# Patient Record
Sex: Male | Born: 1955 | Race: White | Hispanic: No | Marital: Married | Smoking: Former smoker
Health system: Southern US, Community
[De-identification: ages and names within clinical notes are randomized; demographics above are authoritative.]

## PROBLEM LIST (undated history)

## (undated) DIAGNOSIS — N529 Male erectile dysfunction, unspecified: Secondary | ICD-10-CM

## (undated) DIAGNOSIS — Z87442 Personal history of urinary calculi: Secondary | ICD-10-CM

## (undated) DIAGNOSIS — N400 Enlarged prostate without lower urinary tract symptoms: Secondary | ICD-10-CM

## (undated) DIAGNOSIS — M1990 Unspecified osteoarthritis, unspecified site: Secondary | ICD-10-CM

## (undated) DIAGNOSIS — E119 Type 2 diabetes mellitus without complications: Secondary | ICD-10-CM

## (undated) DIAGNOSIS — I639 Cerebral infarction, unspecified: Secondary | ICD-10-CM

## (undated) DIAGNOSIS — F32A Depression, unspecified: Secondary | ICD-10-CM

## (undated) DIAGNOSIS — J45909 Unspecified asthma, uncomplicated: Secondary | ICD-10-CM

## (undated) DIAGNOSIS — K219 Gastro-esophageal reflux disease without esophagitis: Secondary | ICD-10-CM

## (undated) DIAGNOSIS — E669 Obesity, unspecified: Secondary | ICD-10-CM

## (undated) DIAGNOSIS — I4891 Unspecified atrial fibrillation: Secondary | ICD-10-CM

## (undated) DIAGNOSIS — J449 Chronic obstructive pulmonary disease, unspecified: Secondary | ICD-10-CM

## (undated) DIAGNOSIS — I509 Heart failure, unspecified: Secondary | ICD-10-CM

## (undated) DIAGNOSIS — F329 Major depressive disorder, single episode, unspecified: Secondary | ICD-10-CM

## (undated) DIAGNOSIS — E785 Hyperlipidemia, unspecified: Secondary | ICD-10-CM

## (undated) DIAGNOSIS — I69319 Unspecified symptoms and signs involving cognitive functions following cerebral infarction: Secondary | ICD-10-CM

## (undated) DIAGNOSIS — G4730 Sleep apnea, unspecified: Secondary | ICD-10-CM

## (undated) DIAGNOSIS — I10 Essential (primary) hypertension: Secondary | ICD-10-CM

## (undated) HISTORY — PX: OTHER SURGICAL HISTORY: SHX169

## (undated) HISTORY — PX: COLONOSCOPY WITH PROPOFOL: SHX5780

---
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
## 2005-03-25 ENCOUNTER — Ambulatory Visit: Payer: Self-pay | Admitting: Internal Medicine

## 2005-07-29 ENCOUNTER — Ambulatory Visit: Payer: Self-pay | Admitting: Family Medicine

## 2007-12-21 ENCOUNTER — Ambulatory Visit: Payer: Self-pay | Admitting: Gastroenterology

## 2010-05-26 ENCOUNTER — Ambulatory Visit: Payer: Self-pay | Admitting: Ophthalmology

## 2012-11-07 ENCOUNTER — Ambulatory Visit: Payer: Self-pay | Admitting: Unknown Physician Specialty

## 2012-11-27 ENCOUNTER — Ambulatory Visit: Payer: Self-pay | Admitting: Urology

## 2013-05-30 DIAGNOSIS — I639 Cerebral infarction, unspecified: Secondary | ICD-10-CM

## 2013-05-30 HISTORY — DX: Cerebral infarction, unspecified: I63.9

## 2013-06-11 ENCOUNTER — Ambulatory Visit: Payer: Self-pay | Admitting: Urology

## 2013-07-12 ENCOUNTER — Ambulatory Visit: Payer: Self-pay | Admitting: Urology

## 2015-01-31 IMAGING — CT CT ABDOMEN AND PELVIS WITHOUT AND WITH CONTRAST
2 of 10 series · 11 of 46 positions shown, 18 images · IV contrast (isovue)
Comparison: Renal ultrasound 06/11/2013 and prior abdominal CT
11/27/2012 and 07/29/2005

CLINICAL DATA: Followup renal ultrasound to evaluate possible right
renal mass.

EXAM:
CT ABDOMEN AND PELVIS WITHOUT AND WITH CONTRAST
TECHNIQUE: Multidetector CT imaging of the abdomen and pelvis was performed
without contrast material in one or both body regions, followed by
contrast material(s) and further sections in one or both body
regions.
CONTRAST:  100 mL Isovue 370 IV

[Series 2: renal wo · axial · 0.88mm/px · z∈[-899,-485]mm · 9 of 253 slices shown, 15 images]
[im 23/253  soft-tissue]
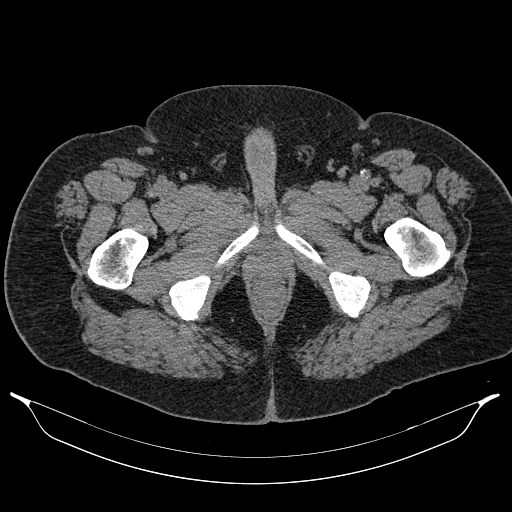
[im 23/253  bone]
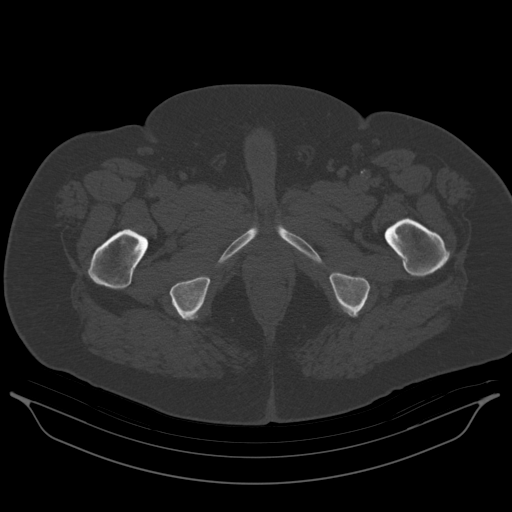
[im 46/253  soft-tissue]
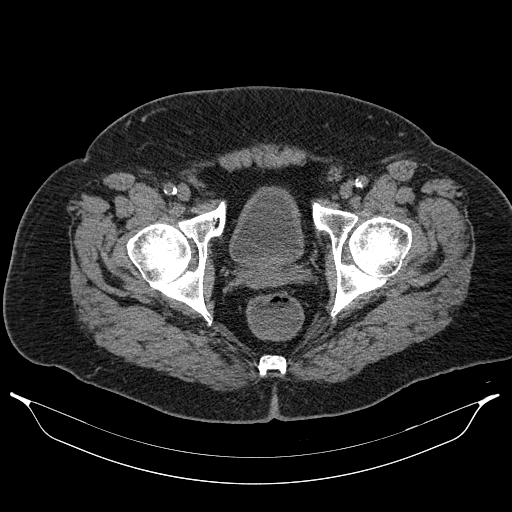
[im 69/253  soft-tissue]
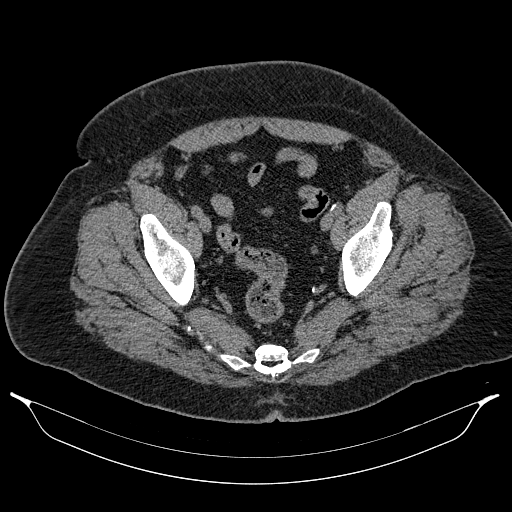
[im 92/253  soft-tissue]
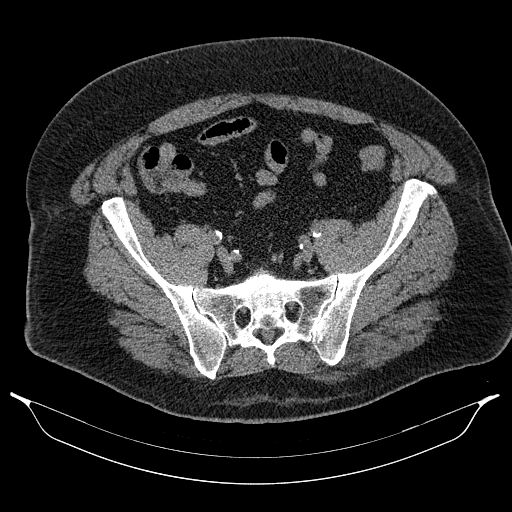
[im 138/253  soft-tissue]
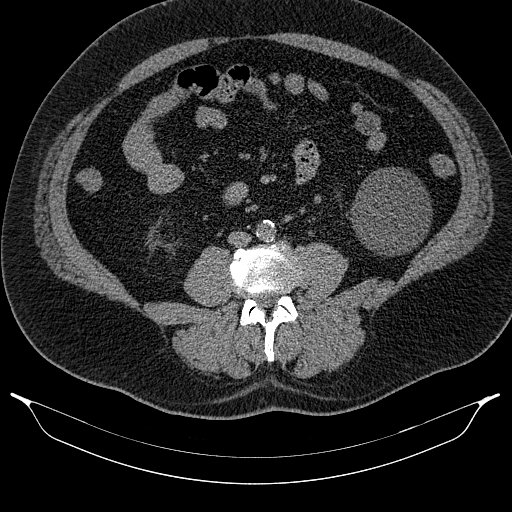
[im 161/253  soft-tissue]
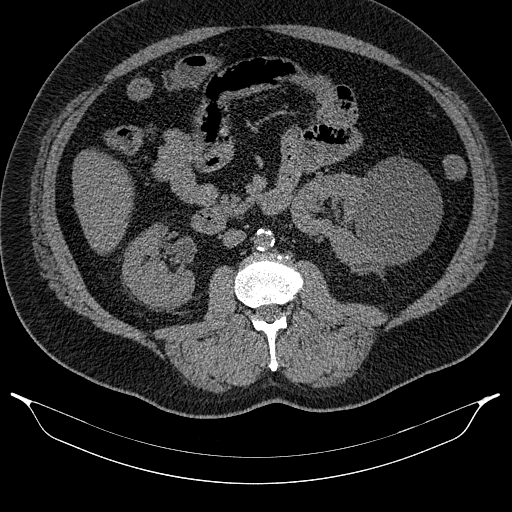
[im 161/253  lung]
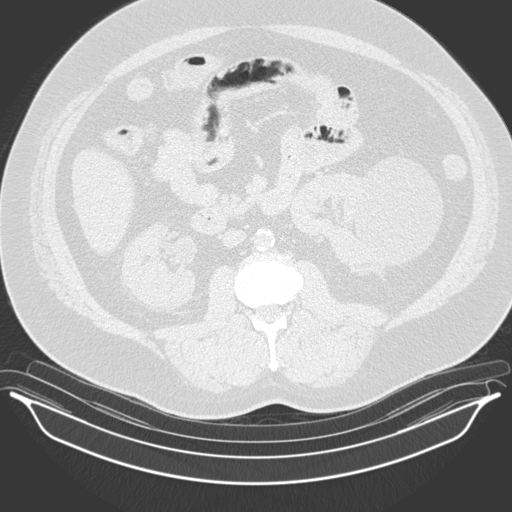
[im 184/253  soft-tissue]
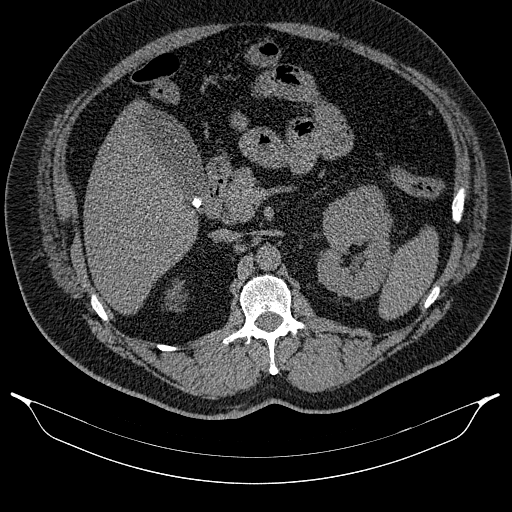
[im 184/253  lung]
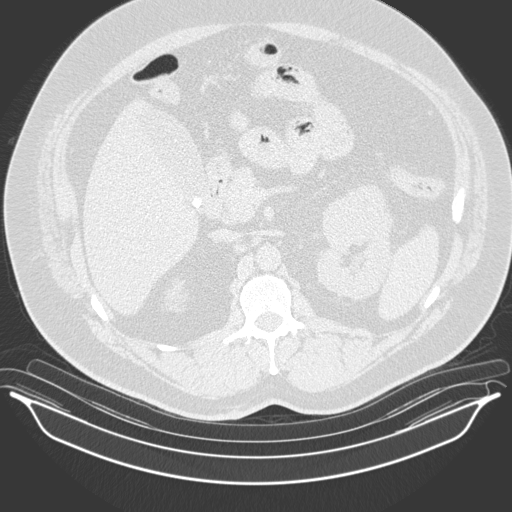
[im 207/253  soft-tissue]
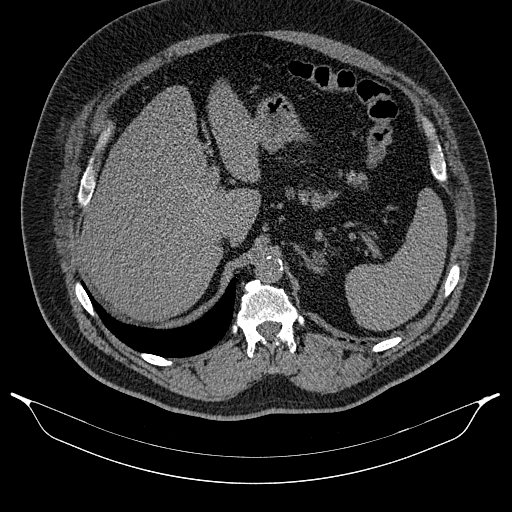
[im 207/253  lung]
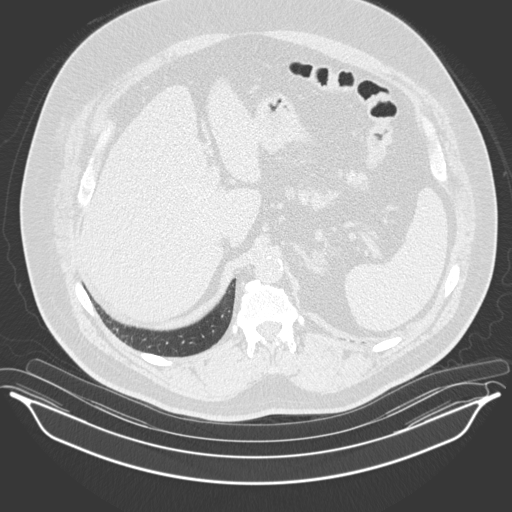
[im 230/253  soft-tissue]
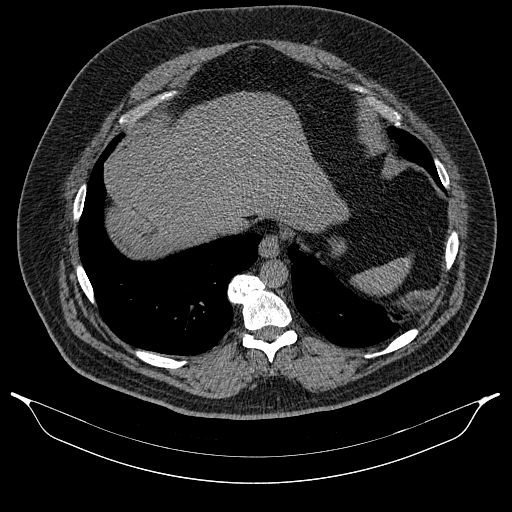
[im 230/253  lung]
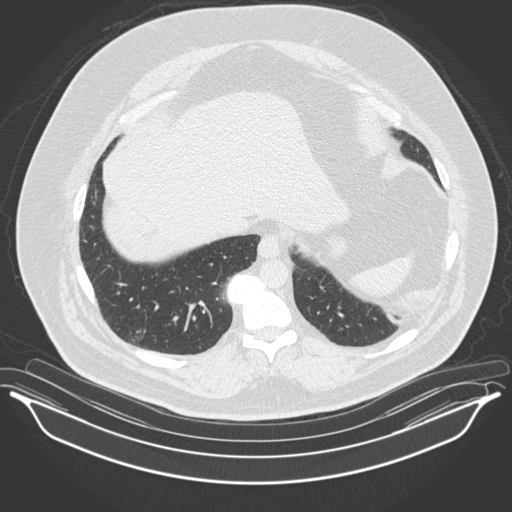
[im 230/253  bone]
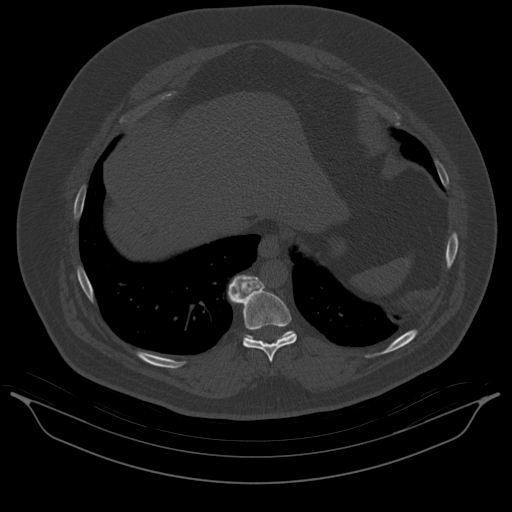

[Series 602: coronal pre · coronal · non-contrast · 0.99mm/px · 2 of 193 slices shown, 3 images]
[im 65/193  soft-tissue]
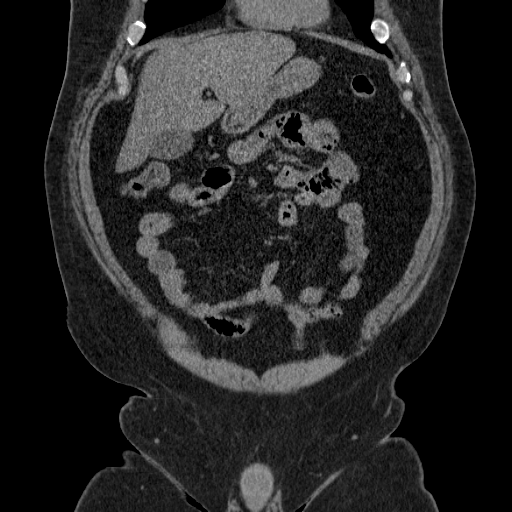
[im 65/193  bone]
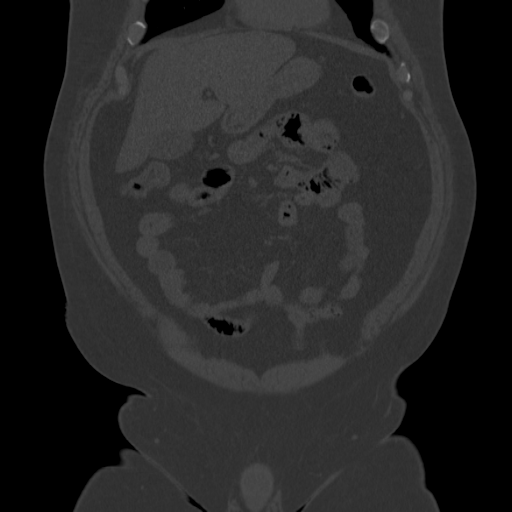
[im 129/193  soft-tissue]
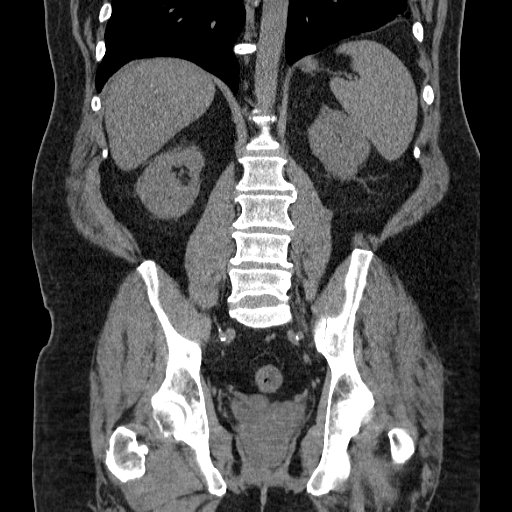

[11 of 46 positions shown; findings below may reference images not displayed]

FINDINGS: Lung bases demonstrate mild focal areas of peripheral increased
interstitial markings unchanged.

Abdominal images a sub cm hypodensity over the dome of the left lobe
of the liver there is mild cholelithiasis. There is mild nodularity
of the left adrenal gland unchanged from 2228. The spleen, right
adrenal gland and pancreas are normal. Appendix is normal. There is
calcified atherosclerotic plaque involving the abdominal aorta and
iliac arteries.

Kidneys are normal in size without evidence of hydronephrosis. There
is a 2 mm stone over the mid to upper pole of the right intrarenal
collecting system unchanged. There is a 10 cm simple exophytic cyst
over the lower pole of the left kidney without significant change.
There is no evidence of a focal solid mass over the right kidney.
There are a few small sub cm renal hypodensities bilaterally too
small to characterize but likely cysts.

Pelvic images demonstrate bladder, prostate and bowel to be within
normal. There is mild spondylosis of the spine.
IMPRESSION: No evidence of solid right renal mass.

Bilateral renal cysts with stable 10 cm simple cyst over the lower
pole of the left kidney. 2 mm nonobstructing right renal stone.

Stable subcentimeter hypodensity over the left dome of the liver
likely a cyst.

Stable left adrenal nodularity since 2228 likely hyperplasia versus
adenoma.

Cholelithiasis.

Minimal focal bibasilar interstitial changes which are stable.

## 2019-02-25 ENCOUNTER — Other Ambulatory Visit
Admission: RE | Admit: 2019-02-25 | Discharge: 2019-02-25 | Disposition: A | Discharge status: Home / Self Care | Payer: Medicare Other | Source: Ambulatory Visit | Attending: Gastroenterology | Admitting: Gastroenterology

## 2019-02-25 ENCOUNTER — Other Ambulatory Visit: Payer: Self-pay

## 2019-02-25 DIAGNOSIS — Z01812 Encounter for preprocedural laboratory examination: Secondary | ICD-10-CM | POA: Diagnosis not present

## 2019-02-25 DIAGNOSIS — Z20828 Contact with and (suspected) exposure to other viral communicable diseases: Secondary | ICD-10-CM | POA: Diagnosis not present

## 2019-02-25 LAB — SARS CORONAVIRUS 2 (TAT 6-24 HRS): SARS Coronavirus 2: NEGATIVE

## 2019-02-27 ENCOUNTER — Encounter: Payer: Self-pay | Admitting: *Deleted

## 2019-02-28 ENCOUNTER — Ambulatory Visit: Payer: Medicare Other | Admitting: Anesthesiology

## 2019-02-28 ENCOUNTER — Other Ambulatory Visit: Payer: Self-pay

## 2019-02-28 ENCOUNTER — Encounter
Admission: RE | Disposition: A | Discharge status: Home / Self Care | Payer: Self-pay | Source: Home / Self Care | Attending: Gastroenterology

## 2019-02-28 ENCOUNTER — Ambulatory Visit
Admission: RE | Admit: 2019-02-28 | Discharge: 2019-02-28 | Disposition: A | Discharge status: Home / Self Care | Payer: Medicare Other | Attending: Gastroenterology | Admitting: Gastroenterology

## 2019-02-28 ENCOUNTER — Encounter: Payer: Self-pay | Admitting: *Deleted

## 2019-02-28 DIAGNOSIS — Z7901 Long term (current) use of anticoagulants: Secondary | ICD-10-CM | POA: Diagnosis not present

## 2019-02-28 DIAGNOSIS — Z7982 Long term (current) use of aspirin: Secondary | ICD-10-CM | POA: Insufficient documentation

## 2019-02-28 DIAGNOSIS — Z6841 Body Mass Index (BMI) 40.0 and over, adult: Secondary | ICD-10-CM | POA: Insufficient documentation

## 2019-02-28 DIAGNOSIS — E119 Type 2 diabetes mellitus without complications: Secondary | ICD-10-CM | POA: Diagnosis not present

## 2019-02-28 DIAGNOSIS — Z79899 Other long term (current) drug therapy: Secondary | ICD-10-CM | POA: Insufficient documentation

## 2019-02-28 DIAGNOSIS — E785 Hyperlipidemia, unspecified: Secondary | ICD-10-CM | POA: Insufficient documentation

## 2019-02-28 DIAGNOSIS — K644 Residual hemorrhoidal skin tags: Secondary | ICD-10-CM | POA: Insufficient documentation

## 2019-02-28 DIAGNOSIS — Z1211 Encounter for screening for malignant neoplasm of colon: Secondary | ICD-10-CM | POA: Insufficient documentation

## 2019-02-28 DIAGNOSIS — I10 Essential (primary) hypertension: Secondary | ICD-10-CM | POA: Diagnosis not present

## 2019-02-28 DIAGNOSIS — I4891 Unspecified atrial fibrillation: Secondary | ICD-10-CM | POA: Insufficient documentation

## 2019-02-28 DIAGNOSIS — E669 Obesity, unspecified: Secondary | ICD-10-CM | POA: Diagnosis not present

## 2019-02-28 DIAGNOSIS — K219 Gastro-esophageal reflux disease without esophagitis: Secondary | ICD-10-CM | POA: Insufficient documentation

## 2019-02-28 DIAGNOSIS — F329 Major depressive disorder, single episode, unspecified: Secondary | ICD-10-CM | POA: Insufficient documentation

## 2019-02-28 DIAGNOSIS — K640 First degree hemorrhoids: Secondary | ICD-10-CM | POA: Insufficient documentation

## 2019-02-28 DIAGNOSIS — N400 Enlarged prostate without lower urinary tract symptoms: Secondary | ICD-10-CM | POA: Insufficient documentation

## 2019-02-28 DIAGNOSIS — M1990 Unspecified osteoarthritis, unspecified site: Secondary | ICD-10-CM | POA: Insufficient documentation

## 2019-02-28 DIAGNOSIS — K5730 Diverticulosis of large intestine without perforation or abscess without bleeding: Secondary | ICD-10-CM | POA: Insufficient documentation

## 2019-02-28 DIAGNOSIS — Z800 Family history of malignant neoplasm of digestive organs: Secondary | ICD-10-CM | POA: Insufficient documentation

## 2019-02-28 DIAGNOSIS — Z7984 Long term (current) use of oral hypoglycemic drugs: Secondary | ICD-10-CM | POA: Insufficient documentation

## 2019-02-28 HISTORY — DX: Unspecified atrial fibrillation: I48.91

## 2019-02-28 HISTORY — DX: Sleep apnea, unspecified: G47.30

## 2019-02-28 HISTORY — DX: Unspecified symptoms and signs involving cognitive functions following cerebral infarction: I69.319

## 2019-02-28 HISTORY — DX: Obesity, unspecified: E66.9

## 2019-02-28 HISTORY — DX: Depression, unspecified: F32.A

## 2019-02-28 HISTORY — DX: Type 2 diabetes mellitus without complications: E11.9

## 2019-02-28 HISTORY — DX: Gastro-esophageal reflux disease without esophagitis: K21.9

## 2019-02-28 HISTORY — DX: Essential (primary) hypertension: I10

## 2019-02-28 HISTORY — DX: Unspecified osteoarthritis, unspecified site: M19.90

## 2019-02-28 HISTORY — DX: Benign prostatic hyperplasia without lower urinary tract symptoms: N40.0

## 2019-02-28 HISTORY — DX: Cerebral infarction, unspecified: I63.9

## 2019-02-28 HISTORY — DX: Hyperlipidemia, unspecified: E78.5

## 2019-02-28 HISTORY — DX: Personal history of urinary calculi: Z87.442

## 2019-02-28 HISTORY — DX: Male erectile dysfunction, unspecified: N52.9

## 2019-02-28 HISTORY — PX: COLONOSCOPY WITH PROPOFOL: SHX5780

## 2019-02-28 LAB — CBC WITH DIFFERENTIAL/PLATELET
Abs Immature Granulocytes: 0.03 10*3/uL (ref 0.00–0.07)
Basophils Absolute: 0.1 10*3/uL (ref 0.0–0.1)
Basophils Relative: 1 %
Eosinophils Absolute: 0.5 10*3/uL (ref 0.0–0.5)
Eosinophils Relative: 6 %
HCT: 43.5 % (ref 39.0–52.0)
Hemoglobin: 14.3 g/dL (ref 13.0–17.0)
Immature Granulocytes: 0 %
Lymphocytes Relative: 14 %
Lymphs Abs: 1.2 10*3/uL (ref 0.7–4.0)
MCH: 30 pg (ref 26.0–34.0)
MCHC: 32.9 g/dL (ref 30.0–36.0)
MCV: 91.2 fL (ref 80.0–100.0)
Monocytes Absolute: 0.6 10*3/uL (ref 0.1–1.0)
Monocytes Relative: 7 %
Neutro Abs: 6.1 10*3/uL (ref 1.7–7.7)
Neutrophils Relative %: 72 %
Platelets: 165 10*3/uL (ref 150–400)
RBC: 4.77 MIL/uL (ref 4.22–5.81)
RDW: 14.3 % (ref 11.5–15.5)
WBC: 8.5 10*3/uL (ref 4.0–10.5)
nRBC: 0 % (ref 0.0–0.2)

## 2019-02-28 LAB — GLUCOSE, CAPILLARY
Glucose-Capillary: 412 mg/dL — ABNORMAL HIGH (ref 70–99)
Glucose-Capillary: 363 mg/dL — ABNORMAL HIGH (ref 70–99)
Glucose-Capillary: 314 mg/dL — ABNORMAL HIGH (ref 70–99)

## 2019-02-28 LAB — PROTIME-INR
INR: 1 (ref 0.8–1.2)
Prothrombin Time: 13.1 seconds (ref 11.4–15.2)

## 2019-02-28 SURGERY — COLONOSCOPY WITH PROPOFOL
Anesthesia: General

## 2019-02-28 MED ORDER — FENTANYL CITRATE (PF) 100 MCG/2ML IJ SOLN
INTRAMUSCULAR | Status: AC
  Filled 2019-02-28: qty 2

## 2019-02-28 MED ORDER — INSULIN ASPART 100 UNIT/ML ~~LOC~~ SOLN
SUBCUTANEOUS | Status: AC
  Administered 2019-02-28: 5 [IU]
  Filled 2019-02-28: qty 1

## 2019-02-28 MED ORDER — METOPROLOL TARTRATE 5 MG/5ML IV SOLN
INTRAVENOUS | Status: DC | PRN
  Administered 2019-02-28: 3 mg via INTRAVENOUS
  Administered 2019-02-28: 2 mg via INTRAVENOUS

## 2019-02-28 MED ORDER — PROPOFOL 10 MG/ML IV BOLUS
INTRAVENOUS | Status: AC
  Filled 2019-02-28: qty 20

## 2019-02-28 MED ORDER — SODIUM CHLORIDE 0.9 % IV SOLN
INTRAVENOUS | Status: DC
  Administered 2019-02-28: 10:00:00 via INTRAVENOUS

## 2019-02-28 MED ORDER — PHENYLEPHRINE HCL (PRESSORS) 10 MG/ML IV SOLN
INTRAVENOUS | Status: DC | PRN
  Administered 2019-02-28 (×4): 200 ug via INTRAVENOUS
  Administered 2019-02-28: 100 ug via INTRAVENOUS

## 2019-02-28 MED ORDER — MIDAZOLAM HCL 2 MG/2ML IJ SOLN
INTRAMUSCULAR | Status: AC
  Filled 2019-02-28: qty 2

## 2019-02-28 MED ORDER — INSULIN ASPART 100 UNIT/ML ~~LOC~~ SOLN
SUBCUTANEOUS | Status: AC
  Administered 2019-02-28: 11:00:00 5 [IU]
  Filled 2019-02-28: qty 1

## 2019-02-28 MED ORDER — INSULIN REGULAR HUMAN 100 UNIT/ML IJ SOLN
5.0000 [IU] | Freq: Once | INTRAMUSCULAR | Status: DC
  Filled 2019-02-28: qty 10

## 2019-02-28 MED ORDER — PROPOFOL 500 MG/50ML IV EMUL
INTRAVENOUS | Status: DC | PRN
  Administered 2019-02-28: 75 ug/kg/min via INTRAVENOUS

## 2019-02-28 MED ORDER — MIDAZOLAM HCL 2 MG/2ML IJ SOLN
INTRAMUSCULAR | Status: DC | PRN
  Administered 2019-02-28: 2 mg via INTRAVENOUS
  Administered 2019-02-28: 1 mg via INTRAVENOUS

## 2019-02-28 MED ORDER — FENTANYL CITRATE (PF) 100 MCG/2ML IJ SOLN
INTRAMUSCULAR | Status: DC | PRN
  Administered 2019-02-28 (×2): 25 ug via INTRAVENOUS

## 2019-02-28 NOTE — Anesthesia Preprocedure Evaluation (Addendum)
Anesthesia Evaluation  Patient identified by MRN, date of birth, ID band Patient awake    Reviewed: Allergy & Precautions, H&P , NPO status , Patient's Chart, lab work & pertinent test results  Airway Mallampati: II  TM Distance: >3 FB     Dental  (+) Chipped, Poor Dentition, Missing   Pulmonary sleep apnea and Continuous Positive Airway Pressure Ventilation , neg COPD, former smoker,           Cardiovascular hypertension, + dysrhythmias Atrial Fibrillation      Neuro/Psych PSYCHIATRIC DISORDERS Depression CVA (6 year ago, still with word finding difficulties)    GI/Hepatic Neg liver ROS, GERD  Controlled,  Endo/Other  diabetes, Type 2, Insulin Dependent, Oral Hypoglycemic AgentsMorbid obesity  Renal/GU negative Renal ROS  negative genitourinary   Musculoskeletal  (+) Arthritis ,   Abdominal   Peds  Hematology negative hematology ROS (+)   Anesthesia Other Findings Past Medical History: No date: A-fib (HCC) No date: Arthritis No date: BPH (benign prostatic hyperplasia) No date: 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 No date: Depression No date: Diabetes mellitus without complication (HCC) No date: ED (erectile dysfunction) No date: GERD (gastroesophageal reflux disease) No date: History of kidney stones No date: Hyperlipidemia No date: Hypertension No date: Obesity No date: Sleep apnea No date: Stroke [facility])  Past Surgical History: No date: COLONOSCOPY WITH PROPOFOL No date: kidney stones removed No date: left rotator cuff repair No date: right shoulder surgery  BMI    Body Mass Index: 43.24 kg/m      Reproductive/Obstetrics negative OB ROS                           Anesthesia Physical Anesthesia Plan  ASA: III  Anesthesia Plan: General   Post-op Pain Management:    Induction:   PONV Risk Score and Plan: Propofol infusion and TIVA  Airway Management  Planned: Nasal Cannula and Natural Airway  Additional Equipment:   Intra-op Plan:   Post-operative Plan:   Informed Consent: I have reviewed the patients History and Physical, chart, labs and discussed the procedure including the risks, benefits and alternatives for the proposed anesthesia with the patient or authorized representative who has indicated his/her understanding and acceptance.     Dental Advisory Given  Plan Discussed with: Anesthesiologist and CRNA  Anesthesia Plan Comments: (BG 412 this AM, pt reports not taking his insulin for the past 2 days.  Will give 5 units SQ insulin and recheck in PACU.  Advised to check in with PCP today regarding optimization of his BG control at home.)      Anesthesia Quick Evaluation

## 2019-02-28 NOTE — H&P (Signed)
Outpatient short stay form Pre-procedure 02/28/2019 10:00 AM Rodney Sails MD  Primary Physician: Gaetano Net, NP  Reason for visit: Colonoscopy  History of present illness: Patient is a 63 year old male presenting today for colonoscopy in regards to family history of colon cancer in primary relative brother.  She has a personal history of thrombocytopenia and due to his history of stroke as well as history of A. fib has been on Coumadin.  His Coumadin has been held appropriately and his INR today is 1.0.  Platelet count is 165.  Tolerated his prep well.  Takes no other blood thinner with the exception of his Coumadin.    Current Facility-Administered Medications:  .  0.9 %  sodium chloride infusion, , Intravenous, Continuous, Rodney Sails, MD, Last Rate: 20 mL/hr at 02/28/19 0936 .  insulin regular (NOVOLIN R) 100 units/mL injection 5 Units, 5 Units, Subcutaneous, Once, Durenda Hurt, MD  Medications Prior to Admission  Medication Sig Dispense Refill Last Dose  . ascorbic acid (VITAMIN C) 500 MG tablet Take by mouth daily.   Past Week at Unknown time  . atorvastatin (LIPITOR) 40 MG tablet Take 40 mg by mouth daily.   Past Week at Unknown time  . carvedilol (COREG) 6.25 MG tablet Take 6.25 mg by mouth 2 (two) times daily with a meal.   02/27/2019 at Unknown time  . diltiazem (CARDIZEM CD) 300 MG 24 hr capsule Take 300 mg by mouth daily.   Past Week at Unknown time  . FLUoxetine (PROZAC) 40 MG capsule Take 40 mg by mouth daily.   Past Week at Unknown time  . fluticasone (FLONASE) 50 MCG/ACT nasal spray Place into both nostrils daily.   Past Week at Unknown time  . gemfibrozil (LOPID) 600 MG tablet Take 600 mg by mouth 2 (two) times daily before a meal.     . insulin detemir (LEVEMIR) 100 unit/ml SOLN Inject into the skin daily.   02/27/2019  . lisinopril-hydrochlorothiazide (ZESTORETIC) 20-25 MG tablet Take 1 tablet by mouth daily.   02/27/2019 at Unknown time  . metFORMIN  (GLUCOPHAGE) 1000 MG tablet Take 1,000 mg by mouth 2 (two) times daily with a meal.   02/27/2019 at Unknown time  . tamsulosin (FLOMAX) 0.4 MG CAPS capsule Take 0.4 mg by mouth.     . warfarin (COUMADIN) 3 MG tablet Take 3 mg by mouth daily.   02/23/2019  . zinc gluconate 50 MG tablet Take 50 mg by mouth daily.     . Albuterol Sulfate 108 (90 Base) MCG/ACT AEPB Inhale into the lungs.   Not Taking at Unknown time  . pioglitazone (ACTOS) 30 MG tablet Take 30 mg by mouth daily.   Not Taking at Unknown time     Allergies  Allergen Reactions  . Sulfa Antibiotics      Past Medical History:  Diagnosis Date  . A-fib ([place])   . Arthritis   . BPH (benign prostatic hyperplasia)   . 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 Depression   . Diabetes mellitus without complication ([place])   . ED (erectile dysfunction)   . GERD (gastroesophageal reflux disease)   . History of kidney stones   . Hyperlipidemia   . Hypertension   . Obesity   . Sleep apnea   . Stroke [facility])     Review of systems:      Physical Exam    Heart and lungs: Regular rate and rhythm lungs are clear  HEENT: Normocephalic atraumatic eyes are anicteric    Other:    Pertinant exam for procedure: nontender nondistended obese not possible to palpate internal organs.  Bowel sounds are positive normoactive    Planned proceedures: Colonoscopy and indicated procedures. I have discussed the risks benefits and complications of procedures to include not limited to bleeding, infection, perforation and the risk of sedation and the patient wishes to proceed.    Christena Deem, MD Gastroenterology 02/28/2019  10:00 AM

## 2019-02-28 NOTE — Transfer of Care (Signed)
Immediate Anesthesia Transfer of Care Note  Patient: Rodney Oneal  Procedure(s) Performed: COLONOSCOPY WITH PROPOFOL (N/A )  Patient Location: PACU  Anesthesia Type:General  Level of Consciousness: awake, alert  and oriented  Airway & Oxygen Therapy: Patient Spontanous Breathing and Patient connected to face mask oxygen  Post-op Assessment: Report given to RN, Post -op Vital signs reviewed and stable and Patient moving all extremities  Post vital signs: Reviewed and stable  Last Vitals:  Vitals Value Taken Time  BP 153/109 02/28/19 1051  Temp 36.6 C 02/28/19 1051  Pulse 103 02/28/19 1056  Resp 16 02/28/19 1056  SpO2 97 % 02/28/19 1056    Last Pain:  Vitals:   02/28/19 1051  TempSrc: Temporal  PainSc: 0-No pain         Complications: No apparent anesthesia complications

## 2019-02-28 NOTE — Anesthesia Post-op Follow-up Note (Signed)
Anesthesia QCDR form completed.        

## 2019-02-28 NOTE — Op Note (Addendum)
[facility] Gastroenterology Patient Name: Rodney Oneal Procedure Date: 02/28/2019 10:01 AM MRN: [id number] Account #: [id number] Date of Birth: [birth year] Admit Type: Outpatient Age: 63 Room: [facility] ENDO ROOM 1 Gender: Male Note Status: Finalized Procedure:            Colonoscopy Indications:          Family history of colon cancer in a first-degree                        relative before age 4 years Providers:            Lollie Sails, MD Referring MD:         No Local Md, MD (Referring MD) Medicines:            Monitored Anesthesia Care Complications:        No immediate complications. Procedure:            Pre-Anesthesia Assessment:                       - ASA Grade Assessment: III - A patient with severe                        systemic disease.                       After obtaining informed consent, the colonoscope was                        passed under direct vision. Throughout the procedure,                        the patient's blood pressure, pulse, and oxygen                        saturations were monitored continuously. The                        Colonoscope was introduced through the anus and                        advanced to the the cecum, identified by appendiceal                        orifice and ileocecal valve. The colonoscopy was                        performed without difficulty. The patient tolerated the                        procedure well. The quality of the bowel preparation                        was good. Findings:      Multiple small-mouthed diverticula were found in the sigmoid colon and       descending colon.      Non-bleeding external and internal hemorrhoids were found during       retroflexion and during anoscopy. The hemorrhoids were small and Grade I       (internal hemorrhoids that do not prolapse).      The digital rectal exam was normal otherwise. Impression:           -  Diverticulosis in the sigmoid colon and in the                         descending colon.                       - Non-bleeding external and internal hemorrhoids.                       - No specimens collected. Recommendation:       - Discharge patient to home.                       - follow up with primary provider in regards to A-fib.                       - Advance diet as tolerated. Procedure Code(s):    --- Professional ---                       [phone], Colonoscopy, flexible; diagnostic, including                        collection of specimen(s) by brushing or washing, when                        performed (separate procedure) Diagnosis Code(s):    --- Professional ---                       K64.0, First degree hemorrhoids                       Z80.0, Family history of malignant neoplasm of                        digestive organs                       K57.30, Diverticulosis of large intestine without                        perforation or abscess without bleeding CPT copyright 2019 [employer]. All rights reserved. The codes documented in this report are preliminary and upon coder review may  be revised to meet current compliance requirements. Christena Deem, MD 02/28/2019 10:45:56 AM This report has been signed electronically. Number of Addenda: 0 Note Initiated On: 02/28/2019 10:01 AM Scope Withdrawal Time: 0 hours 7 minutes 3 seconds  Total Procedure Duration: 0 hours 11 minutes 33 seconds       [facility]

## 2019-03-01 ENCOUNTER — Encounter: Payer: Self-pay | Admitting: Gastroenterology

## 2019-03-01 NOTE — Anesthesia Postprocedure Evaluation (Signed)
Anesthesia Post Note  Patient: Rodney Oneal  Procedure(s) Performed: COLONOSCOPY WITH PROPOFOL (N/A )  Patient location during evaluation: PACU Anesthesia Type: General Level of consciousness: awake and alert Pain management: pain level controlled Vital Signs Assessment: post-procedure vital signs reviewed and stable Respiratory status: spontaneous breathing, nonlabored ventilation and respiratory function stable Cardiovascular status: blood pressure returned to baseline and stable Postop Assessment: no apparent nausea or vomiting Anesthetic complications: no Comments: BG decreased to 314.  Pt was advised to call his PCP to optimize BG management outpatient. Afib with HR variable mostly 100s-110s.     Last Vitals:  Vitals:   02/28/19 1111 02/28/19 1121  BP: (!) 156/100 130/82  Pulse: (!) 118 95  Resp: 18 18  Temp:    SpO2: 95% 94%    Last Pain:  Vitals:   03/01/19 0739  TempSrc:   PainSc: 0-No pain                 Durenda Hurt

## 2020-11-04 ENCOUNTER — Encounter: Payer: Medicare Other | Attending: Internal Medicine | Admitting: Internal Medicine

## 2020-11-04 ENCOUNTER — Other Ambulatory Visit: Payer: Self-pay

## 2020-11-04 DIAGNOSIS — Z7901 Long term (current) use of anticoagulants: Secondary | ICD-10-CM | POA: Diagnosis not present

## 2020-11-04 DIAGNOSIS — L97828 Non-pressure chronic ulcer of other part of left lower leg with other specified severity: Secondary | ICD-10-CM | POA: Diagnosis not present

## 2020-11-04 DIAGNOSIS — Z87891 Personal history of nicotine dependence: Secondary | ICD-10-CM | POA: Diagnosis not present

## 2020-11-04 DIAGNOSIS — E11622 Type 2 diabetes mellitus with other skin ulcer: Secondary | ICD-10-CM | POA: Diagnosis not present

## 2020-11-04 NOTE — Progress Notes (Signed)
NIKKO, GOLDWIRE ([id number]) Visit Report for 11/04/2020 Chief Complaint Document Details Patient Name: Rodney Oneal, Rodney Oneal. Date of Service: 11/04/2020 8:30 AM Medical Record Number: [id number] Patient Account Number: [id number] Date of Birth/Sex: 06/06/1955 (65 y.o. M) Treating RN: Huel Coventry Primary Care Provider: Lenon Oms Other Clinician: Referring Provider: Lenon Oms Treating Provider/Extender: Altamese [place] in Treatment: 0 Information Obtained from: Patient Chief Complaint 11/04/2020; patient is here for review of a hematoma on his left lower leg Electronic Signature(s) Signed: 11/04/2020 4:14:58 PM By: Baltazar Najjar MD Entered By: Baltazar Najjar on 11/04/2020 09:33:40 [name] ([id number]) -------------------------------------------------------------------------------- HPI Details Patient Name: Rodney Oneal. Date of Service: 11/04/2020 8:30 AM Medical Record Number: [id number] Patient Account Number: [id number] Date of Birth/Sex: 05/06/1956 (65 y.o. M) Treating RN: Huel Coventry Primary Care Provider: Lenon Oms Other Clinician: Referring Provider: Lenon Oms Treating Provider/Extender: Altamese [place] in Treatment: 0 History of Present Illness HPI Description: ADMISSION 11/04/2020 This is a 65 year old man who is on chronic Coumadin secondary to atrial fibrillation. He had a fall on 5/25 out of bed but he managed to hit his anterior left leg on a tool that was lying in the vicinity. He was seen at his primary doctor's office on 10/30/2020 and there was a concern for cellulitis he was put on cephalexin 500 3 times daily that he still taking. He currently has a significant swelling with some nonviable skin on top of it. He says this is begin leaking but it is not frankly open. He is not complaining of a lot of pain. Past medical history includes type 2 diabetes, chronic A. fib on Coumadin, hypertension, history of a CVA ABI in this clinic was 0.92 on the  left Electronic Signature(s) Signed: 11/04/2020 4:14:58 PM By: Baltazar Najjar MD Entered By: Baltazar Najjar on 11/04/2020 09:35:23 [name] ([id number]) -------------------------------------------------------------------------------- Physical Exam Details Patient Name: Rodney Oneal. Date of Service: 11/04/2020 8:30 AM Medical Record Number: [id number] Patient Account Number: [id number] Date of Birth/Sex: [birth year] (65 y.o. M) Treating RN: Huel Coventry Primary Care Provider: Lenon Oms Other Clinician: Referring Provider: Lenon Oms Treating Provider/Extender: Altamese [place] in Treatment: 0 Constitutional Patient is hypertensive.. Pulse regular and within target range for patient.Marland Kitchen Respirations regular, non-labored and within target range.. Temperature is normal and within the target range for the patient.. Cardiovascular Irregularly irregular heart rate. Needle pulses are easily palpable in the dorsalis pedis and posterior tibial on the left. Minor skin changes of chronic venous insufficiency predominantly on the right leg however no major edema. Notes Wound exam; the areas on the left anterior lower leg. 2 small black eschared wound areas. Golf ball sized swelling around this which is ballotable. There is no tenderness no evidence of infection currently Electronic Signature(s) Signed: 11/04/2020 4:14:58 PM By: Baltazar Najjar MD Entered By: Baltazar Najjar on 11/04/2020 09:36:54 [name] ([id number]) -------------------------------------------------------------------------------- Physician Orders Details Patient Name: Rodney Oneal. Date of Service: 11/04/2020 8:30 AM Medical Record Number: [id number] Patient Account Number: [id number] Date of Birth/Sex: 04/03/1956 (65 y.o. M) Treating RN: Rogers Blocker Primary Care Provider: Lenon Oms Other Clinician: Referring Provider: Lenon Oms Treating Provider/Extender: Altamese  in Treatment:  0 Verbal / Phone Orders: No Diagnosis Coding Follow-up Appointments o Return Appointment in 1 week. o Nurse Visit as needed Bathing/ Shower/ Hygiene o May shower with wound dressing protected with water repellent cover or cast protector. o No tub bath. Edema Control - Lymphedema / Segmental Compressive Device / Other   o Optional: One layer of unna paste to top of compression wrap (to act as an anchor). Wound Treatment Wound #1 - Lower Leg Wound Laterality: Left, Medial Cleanser: Soap and Water 1 x Per Week/15 Days Discharge Instructions: Gently cleanse wound with antibacterial soap, rinse and pat dry prior to dressing wounds Primary Dressing: Calcium Alginate Dressing, 6x6 (in/in) 1 x Per Week/15 Days Secondary Dressing: ABD Pad 5x9 (in/in) 1 x Per Week/15 Days Discharge Instructions: Cover with ABD pad Compression Wrap: Medichoice 4 layer Compression System, 35-40 mmHG 1 x Per Week/15 Days Discharge Instructions: Apply multi-layer wrap as directed. Electronic Signature(s) Signed: 11/04/2020 1:03:43 PM By: Phillis Haggis, Dondra Prader RN Signed: 11/04/2020 4:14:58 PM By: Baltazar Najjar MD Entered By: Phillis Haggis, Dondra Prader on 11/04/2020 09:21:36 CARVEL, HUSKINS ([id number]) -------------------------------------------------------------------------------- Problem List Details Patient Name: Rodney Oneal. Date of Service: 11/04/2020 8:30 AM Medical Record Number: [id number] Patient Account Number: [id number] Date of Birth/Sex: February 26, 1956 (65 y.o. M) Treating RN: Huel Coventry Primary Care Provider: Lenon Oms Other Clinician: Referring Provider: Lenon Oms Treating Provider/Extender: Altamese [place] in Treatment: 0 Active Problems ICD-10 Encounter Code Description Active Date MDM Diagnosis S80.12XD Contusion of left lower leg, subsequent encounter 11/04/2020 No Yes 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 11/04/2020 No Yes specified  severity T45.515S Adverse effect of anticoagulants, sequela 11/04/2020 No Yes Inactive Problems Resolved Problems Electronic Signature(s) Signed: 11/04/2020 4:14:58 PM By: Baltazar Najjar MD Entered By: Baltazar Najjar on 11/04/2020 09:26:09 [name] ([id number]) -------------------------------------------------------------------------------- Progress Note Details Patient Name: [name]. Date of Service: 11/04/2020 8:30 AM Medical Record Number: [id number] Patient Account Number: [id number] Date of Birth/Sex: 1956-03-14 (65 y.o. M) Treating RN: Huel Coventry Primary Care Provider: Lenon Oms Other Clinician: Referring Provider: Lenon Oms Treating Provider/Extender: Altamese [place] in Treatment: 0 Subjective Chief Complaint Information obtained from Patient 11/04/2020; patient is here for review of a hematoma on his left lower leg History of Present Illness (HPI) ADMISSION 11/04/2020 This is a 65 year old man who is on chronic Coumadin secondary to atrial fibrillation. He had a fall on 5/25 out of bed but he managed to hit his anterior left leg on a tool that was lying in the vicinity. He was seen at his primary doctor's office on 10/30/2020 and there was a concern for cellulitis he was put on cephalexin 500 3 times daily that he still taking. He currently has a significant swelling with some nonviable skin on top of it. He says this is begin leaking but it is not frankly open. He is not complaining of a lot of pain. Past medical history includes type 2 diabetes, chronic A. fib on Coumadin, hypertension, history of a CVA ABI in this clinic was 0.92 on the left Patient History Information obtained from Patient. Allergies Sulfa (Sulfonamide Antibiotics) (Severity: Moderate, Reaction: hives) Social History Former smoker - ended on 05/30/2013, Marital Status - Married, Alcohol Use - Never, Drug Use - No History, Caffeine Use - Rarely. Medical History Eyes Patient has  history of Cataracts - surgery Respiratory Patient has history of Sleep Apnea Cardiovascular Patient has history of Arrhythmia - afib, Coronary Artery Disease, Hypertension Endocrine Patient has history of Type II Diabetes Musculoskeletal Patient has history of Osteoarthritis Patient is treated with Insulin, Oral Agents. Blood sugar is not tested. Review of Systems (ROS) Constitutional Symptoms (General Health) Denies complaints or symptoms of Fatigue, Fever, Chills, Marked Weight Change. Ear/Nose/Mouth/Throat Complains or has symptoms of Sinusitis. Hematologic/Lymphatic Denies complaints  or symptoms of Bleeding / Clotting Disorders, Human Immunodeficiency Virus. Respiratory Complains or has symptoms of Shortness of Breath. Denies complaints or symptoms of Chronic or frequent coughs. Cardiovascular Denies complaints or symptoms of LE edema. Gastrointestinal Denies complaints or symptoms of Frequent diarrhea, Nausea, Vomiting. Genitourinary Denies complaints or symptoms of Kidney failure/ Dialysis, Incontinence/dribbling, kidney stones Integumentary (Skin) Denies complaints or symptoms of Wounds, Bleeding or bruising tendency, Breakdown, Swelling. Neurologic Denies complaints or symptoms of Numbness/parasthesias, Focal/Weakness. Psychiatric Denies complaints or symptoms of Anxiety, Claustrophobia. ALAMIN, MCCUISTON ([id number]) Objective Constitutional Patient is hypertensive.. Pulse regular and within target range for patient.Marland Kitchen Respirations regular, non-labored and within target range.. Temperature is normal and within the target range for the patient.. Vitals Time Taken: 8:40 AM, Height: 71 in, Source: Stated, Weight: 311 lbs, Source: Measured, BMI: 43.4, Temperature: 98 F, Pulse: 75 bpm, Respiratory Rate: 20 breaths/min, Blood Pressure: 142/75 mmHg. Cardiovascular Irregularly irregular heart rate. Needle pulses are easily palpable in the dorsalis pedis and posterior tibial  on the left. Minor skin changes of chronic venous insufficiency predominantly on the right leg however no major edema. General Notes: Wound exam; the areas on the left anterior lower leg. 2 small black eschared wound areas. Golf ball sized swelling around this which is ballotable. There is no tenderness no evidence of infection currently Integumentary (Hair, Skin) Wound #1 status is Open. Original cause of wound was Skin Tear/Laceration. The date acquired was: 10/21/2020. The wound is located on the Left,Medial Lower Leg. The wound measures 2.5cm length x 1.5cm width x 0.1cm depth; 2.945cm^2 area and 0.295cm^3 volume. There is Fat Layer (Subcutaneous Tissue) exposed. There is no tunneling or undermining noted. There is a none present amount of drainage noted. There is no granulation within the wound bed. There is a large (67-100%) amount of necrotic tissue within the wound bed including Eschar. Assessment Active Problems ICD-10 Contusion of left lower leg, subsequent encounter Non-pressure chronic ulcer of other part of left lower leg with other specified severity Adverse effect of anticoagulants, sequela Procedures Wound #1 Pre-procedure diagnosis of Wound #1 is a Trauma, Other located on the Left,Medial Lower Leg . There was a Four Layer Compression Therapy Procedure with a pre-treatment ABI of 0.9 by Rogers Blocker, RN. Post procedure Diagnosis Wound #1: Same as Pre-Procedure Plan Follow-up Appointments: Return Appointment in 1 week. Nurse Visit as needed Bathing/ Shower/ Hygiene: May shower with wound dressing protected with water repellent cover or cast protector. No tub bath. Edema Control - Lymphedema / Segmental Compressive Device / Other: Optional: One layer of unna paste to top of compression wrap (to act as an anchor). WOUND #1: - Lower Leg Wound Laterality: Left, Medial Cleanser: Soap and Water 1 x Per Week/15 Days Discharge Instructions: Gently cleanse wound with  antibacterial soap, rinse and pat dry prior to dressing wounds MAZI, BRAILSFORD ([id number]) Primary Dressing: Calcium Alginate Dressing, 6x6 (in/in) 1 x Per Week/15 Days Secondary Dressing: ABD Pad 5x9 (in/in) 1 x Per Week/15 Days Discharge Instructions: Cover with ABD pad Compression Wrap: Medichoice 4 layer Compression System, 35-40 mmHG 1 x Per Week/15 Days Discharge Instructions: Apply multi-layer wrap as directed. 1. I am going to put calcium alginate on this area, ABDs and I put him under 4-layer compression 2. No current concerned about active infection. Advised him to continue the cephalexin until its finished. I do not think he needs additional antibiotics however 3. He has 2 small eschared areas on top of this golf ball sized hematoma. These areas  are not going to remain viable and definitely. However at the time they become nonviable and need to be removed any improvement in the underlying hematoma would be helpful in limiting the wound size that will result. Therefore no debridement today we are simply going to put calcium alginate ABDs under 4-layer compression 4. No evidence of arterial insufficiency 5. I advised the patient that at some point this is going to open there will be a jellylike consistency of old blood. I told him not to panic about this. I spent 40 minutes in review of this patient's past medical history, face-to-face evaluation and preparation of this record Electronic Signature(s) Signed: 11/04/2020 4:14:58 PM By: Baltazar Najjar MD Entered By: Baltazar Najjar on 11/04/2020 09:38:46 [name] ([id number]) -------------------------------------------------------------------------------- ROS/PFSH Details Patient Name: [name]. Date of Service: 11/04/2020 8:30 AM Medical Record Number: [id number] Patient Account Number: [id number] Date of Birth/Sex: 30-Sep-1955 (65 y.o. M) Treating RN: Hansel Feinstein Primary Care Provider: Lenon Oms Other  Clinician: Referring Provider: Lenon Oms Treating Provider/Extender: Altamese [place] in Treatment: 0 Information Obtained From Patient Constitutional Symptoms (General Health) Complaints and Symptoms: Negative for: Fatigue; Fever; Chills; Marked Weight Change Ear/Nose/Mouth/Throat Complaints and Symptoms: Positive for: Sinusitis Hematologic/Lymphatic Complaints and Symptoms: Negative for: Bleeding / Clotting Disorders; Human Immunodeficiency Virus Respiratory Complaints and Symptoms: Positive for: Shortness of Breath Negative for: Chronic or frequent coughs Medical History: Positive for: Sleep Apnea Cardiovascular Complaints and Symptoms: Negative for: LE edema Medical History: Positive for: Arrhythmia - afib; Coronary Artery Disease; Hypertension Gastrointestinal Complaints and Symptoms: Negative for: Frequent diarrhea; Nausea; Vomiting Genitourinary Complaints and Symptoms: Negative for: Kidney failure/ Dialysis; Incontinence/dribbling Review of System Notes: kidney stones Integumentary (Skin) Complaints and Symptoms: Negative for: Wounds; Bleeding or bruising tendency; Breakdown; Swelling Neurologic Complaints and Symptoms: Negative for: Numbness/parasthesias; Focal/Weakness Psychiatric TYHEEM, BOUGHNER ([id number]) Complaints and Symptoms: Negative for: Anxiety; Claustrophobia Eyes Medical History: Positive for: Cataracts - surgery Endocrine Medical History: Positive for: Type II Diabetes Time with diabetes: 2002 Treated with: Insulin, Oral agents Blood sugar tested every day: No Immunological Musculoskeletal Medical History: Positive for: Osteoarthritis Oncologic HBO Extended History Items Eyes: Cataracts Immunizations Pneumococcal Vaccine: Received Pneumococcal Vaccination: Yes Implantable Devices None Family and Social History Former smoker - ended on 05/30/2013; Marital Status - Married; Alcohol Use: Never; Drug Use: No History;  Caffeine Use: Rarely; Financial Concerns: No; Food, Clothing or Shelter Needs: No; Support System Lacking: No; Transportation Concerns: No Electronic Signature(s) Signed: 11/04/2020 2:11:15 PM By: Hansel Feinstein Signed: 11/04/2020 4:14:58 PM By: Baltazar Najjar MD Entered By: Hansel Feinstein on 11/04/2020 08:47:02 KIMSEY, DEMAREE ([id number]) -------------------------------------------------------------------------------- SuperBill Details Patient Name: KENN, REKOWSKI. Date of Service: 11/04/2020 Medical Record Number: [id number] Patient Account Number: [id number] Date of Birth/Sex: Jul 17, 1955 (65 y.o. M) Treating RN: Rogers Blocker Primary Care Provider: Lenon Oms Other Clinician: Referring Provider: Lenon Oms Treating Provider/Extender: Altamese [place] in Treatment: 0 Diagnosis Coding ICD-10 Codes Code Description [phone] Contusion of left lower leg, subsequent encounter 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 specified severity T45.515S Adverse effect of anticoagulants, sequela Facility Procedures CPT4 Code: 69678938 Description: 99213 - WOUND CARE VISIT-LEV 3 EST PT Modifier: Quantity: 1 CPT4 Code: 10175102 Description: (Facility Use Only) 29581LT - APPLY MULTLAY COMPRS LWR LT LEG Modifier: Quantity: 1 Physician Procedures CPT4 Code: [id number] Description: WC PHYS LEVEL 3 o NEW PT Modifier: Quantity: 1 CPT4 Code: Description: ICD-10 Diagnosis Description S80.12XD Contusion of left lower leg, subsequent encounter L97.828 Non-pressure  chronic ulcer of other part of left lower leg with other spe T45.515S Adverse effect of anticoagulants, sequela Modifier: cified severity Quantity: Electronic Signature(s) Signed: 11/04/2020 4:14:58 PM By: Baltazar Najjarobson, Zamaya Rapaport MD Entered By: Baltazar Najjarobson, Hisako Bugh on 11/04/2020 09:39:18

## 2020-11-04 NOTE — Progress Notes (Signed)
JACHAI, OKAZAKI ([id number]) Visit Report for 11/04/2020 Abuse/Suicide Risk Screen Details Patient Name: Rodney Oneal, Rodney Oneal. Date of Service: 11/04/2020 8:30 AM Medical Record Number: [id number] Patient Account Number: [id number] Date of Birth/Sex: June 27, 1955 (65 y.o. M) Treating RN: Hansel Feinstein Primary Care Clemon Devaul: Lenon Oms Other Clinician: Referring Alia Parsley: Lenon Oms Treating Onica Davidovich/Extender: [facility] [place] in Treatment: 0 Abuse/Suicide Risk Screen Items Answer ABUSE RISK SCREEN: Has anyone close to you tried to hurt or harm you recentlyo No Do you feel uncomfortable with anyone in your familyo No Has anyone forced you do things that you didnot want to doo No Electronic Signature(s) Signed: 11/04/2020 2:11:15 PM By: Hansel Feinstein Entered By: Hansel Feinstein on 11/04/2020 08:47:24 Stehle, Dola Argyle ([id number]) -------------------------------------------------------------------------------- Activities of Daily Living Details Patient Name: Rodney Oneal, Rodney Oneal. Date of Service: 11/04/2020 8:30 AM Medical Record Number: [id number] Patient Account Number: [id number] Date of Birth/Sex: [birth year] (65 y.o. M) Treating RN: Hansel Feinstein Primary Care Ohn Bostic: Lenon Oms Other Clinician: Referring Naviah Belfield: Lenon Oms Treating Stephfon Bovey/Extender: [facility] [place] in Treatment: 0 Activities of Daily Living Items Answer Activities of Daily Living (Please select one for each item) Drive Automobile Completely Able Take Medications Completely Able Use Telephone Completely Able Care for Appearance Completely Able Use Toilet Completely Able Bath / Shower Completely Able Dress Self Completely Able Feed Self Completely Able Walk Completely Able Get In / Out Bed Completely Able Housework Completely Able Prepare Meals Completely Able Handle Money Completely Able Shop for Self Completely Able Electronic Signature(s) Signed: 11/04/2020 2:11:15 PM By: Hansel Feinstein Entered By: Hansel Feinstein on 11/04/2020 08:47:46 Sottile, Dola Argyle ([id number]) -------------------------------------------------------------------------------- Education Screening Details Patient Name: Rodney Oneal. Date of Service: 11/04/2020 8:30 AM Medical Record Number: [id number] Patient Account Number: [id number] Date of Birth/Sex: [birth year] (65 y.o. M) Treating RN: Hansel Feinstein Primary Care Anajah Sterbenz: Lenon Oms Other Clinician: Referring Ryshawn Sanzone: Lenon Oms Treating Shanaia Sievers/Extender: [facility] [place] in Treatment: 0 Primary Learner Assessed: Patient Learning Preferences/Education Level/Primary Language Learning Preference: Explanation Highest Education Level: High School Preferred Language: English Cognitive Barrier Language Barrier: No Translator Needed: No Memory Deficit: No Emotional Barrier: No Cultural/Religious Beliefs Affecting Medical Care: No Physical Barrier Impaired Vision: No Impaired Hearing: Yes no Decreased Hand dexterity: No Knowledge/Comprehension Knowledge Level: High Comprehension Level: High Ability to understand written instructions: High Ability to understand verbal instructions: High Motivation Anxiety Level: Calm Cooperation: Cooperative Education Importance: Acknowledges Need Interest in Health Problems: Asks Questions Perception: Coherent Willingness to Engage in Self-Management High Activities: Readiness to Engage in Self-Management High Activities: Electronic Signature(s) Signed: 11/04/2020 2:11:15 PM By: Hansel Feinstein Entered By: Hansel Feinstein on 11/04/2020 08:48:17 Rodney Oneal ([id number]) -------------------------------------------------------------------------------- Fall Risk Assessment Details Patient Name: Rodney Oneal. Date of Service: 11/04/2020 8:30 AM Medical Record Number: [id number] Patient Account Number: [id number] Date of Birth/Sex: 18-Mar-1956 (65 y.o. M) Treating RN: Hansel Feinstein Primary Care Carolos Fecher: Lenon Oms Other  Clinician: Referring Chesni Vos: Lenon Oms Treating Austyn Seier/Extender: [facility] [place] in Treatment: 0 Fall Risk Assessment Items Have you had 2 or more falls in the last 12 monthso 0 Yes Have you had any fall that resulted in injury in the last 12 monthso 0 Yes FALLS RISK SCREEN History of falling - immediate or within 3 months 25 Yes Secondary diagnosis (Do you have 2 or more medical diagnoseso) 0 No Ambulatory aid None/bed rest/wheelchair/nurse 0 Yes Crutches/cane/walker 0 No Furniture 0 No Intravenous therapy Access/Saline/Heparin Lock 0 No Gait/Transferring Normal/ bed rest/ wheelchair 0 Yes Weak (short steps  with or without shuffle, stooped but able to lift head while walking, may 0 No seek support from furniture) Impaired (short steps with shuffle, may have difficulty arising from chair, head down, impaired 0 No balance) Mental Status Oriented to own ability 0 Yes Electronic Signature(s) Signed: 11/04/2020 2:11:15 PM By: Hansel Feinstein Entered By: Hansel Feinstein on 11/04/2020 08:49:50 Halderman, Dola Argyle ([id number]) -------------------------------------------------------------------------------- Foot Assessment Details Patient Name: Rodney Oneal. Date of Service: 11/04/2020 8:30 AM Medical Record Number: [id number] Patient Account Number: [id number] Date of Birth/Sex: [birth year] (65 y.o. M) Treating RN: Hansel Feinstein Primary Care Rea Kalama: Lenon Oms Other Clinician: Referring Antwione Picotte: Lenon Oms Treating Caillou Minus/Extender: Altamese [place] in Treatment: 0 Foot Assessment Items Site Locations + = Sensation present, - = Sensation absent, C = Callus, U = Ulcer R = Redness, W = Warmth, M = Maceration, PU = Pre-ulcerative lesion F = Fissure, S = Swelling, D = Dryness Assessment Right: Left: Other Deformity: No No Prior Foot Ulcer: No No Prior Amputation: No No Charcot Joint: No No Ambulatory Status: Ambulatory Without Help Gait: Steady Electronic  Signature(s) Signed: 11/04/2020 2:11:15 PM By: Hansel Feinstein Entered By: Hansel Feinstein on 11/04/2020 08:51:41 Rodney Oneal ([id number]) -------------------------------------------------------------------------------- Nutrition Risk Screening Details Patient Name: Rodney Oneal. Date of Service: 11/04/2020 8:30 AM Medical Record Number: [id number] Patient Account Number: [id number] Date of Birth/Sex: 01/08/1956 (66 y.o. M) Treating RN: Hansel Feinstein Primary Care Dorris Pierre: Lenon Oms Other Clinician: Referring Maryjo Ragon: Lenon Oms Treating Eleaner Dibartolo/Extender: Altamese [place] in Treatment: 0 Height (in): 71 Weight (lbs): 311 Body Mass Index (BMI): 43.4 Nutrition Risk Screening Items Score Screening NUTRITION RISK SCREEN: I have an illness or condition that made me change the kind and/or amount of food I eat 0 No I eat fewer than two meals per day 0 No I eat few fruits and vegetables, or milk products 0 No I have three or more drinks of beer, liquor or wine almost every day 0 No I have tooth or mouth problems that make it hard for me to eat 0 No I don't always have enough money to buy the food I need 0 No I eat alone most of the time 0 No I take three or more different prescribed or over-the-counter drugs a day 1 Yes Without wanting to, I have lost or gained 10 pounds in the last six months 0 No I am not always physically able to shop, cook and/or feed myself 0 No Nutrition Protocols Good Risk Protocol 0 No interventions needed Moderate Risk Protocol High Risk Proctocol Risk Level: Good Risk Score: 1 Electronic Signature(s) Signed: 11/04/2020 2:11:15 PM By: Hansel Feinstein Entered ByHansel Feinstein on 11/04/2020 08:50:13

## 2020-11-06 NOTE — Progress Notes (Signed)
XYLON, CROOM ([id number]) Visit Report for 11/04/2020 Allergy List Details Patient Name: Rodney Oneal, Rodney Oneal. Date of Service: 11/04/2020 8:30 AM Medical Record Number: [id number] Patient Account Number: [id number] Date of Birth/Sex: April 04, 1956 (65 y.o. M) Treating RN: Hansel Feinstein Primary Care Ronald Vinsant: Lenon Oms Other Clinician: Referring Marshaun Lortie: Lenon Oms Treating Shaelee Forni/Extender: Maxwell Caul Weeks in Treatment: 0 Allergies Active Allergies Sulfa (Sulfonamide Antibiotics) Reaction: hives Severity: Moderate Allergy Notes Electronic Signature(s) Signed: 11/04/2020 2:11:15 PM By: Hansel Feinstein Entered By: Hansel Feinstein on 11/04/2020 08:42:24 Rodney Oneal ([id number]) -------------------------------------------------------------------------------- Arrival Information Details Patient Name: Rodney Oneal. Date of Service: 11/04/2020 8:30 AM Medical Record Number: [id number] Patient Account Number: [id number] Date of Birth/Sex: Jul 25, 1955 (65 y.o. M) Treating RN: Hansel Feinstein Primary Care Zaheer Wageman: Lenon Oms Other Clinician: Referring Shamiya Demeritt: Lenon Oms Treating Staphanie Harbison/Extender: Altamese [place] in Treatment: 0 Visit Information Patient Arrived: Ambulatory Arrival Time: 08:36 Accompanied By: wife Transfer Assistance: None Patient Identification Verified: Yes Secondary Verification Process Completed: Yes Patient Has Alerts: Yes Patient Alerts: Patient on Blood Thinner DIABETIC COUMADIN Electronic Signature(s) Signed: 11/04/2020 2:11:15 PM By: Hansel Feinstein Entered By: Hansel Feinstein on 11/04/2020 08:39:31 Kwong, Dola Argyle ([id number]) -------------------------------------------------------------------------------- Clinic Level of Care Assessment Details Patient Name: Rodney Oneal, Rodney Oneal. Date of Service: 11/04/2020 8:30 AM Medical Record Number: [id number] Patient Account Number: [id number] Date of Birth/Sex: 1956/05/19 (65 y.o. M) Treating RN: Rogers Blocker Primary Care Benny Deutschman: Lenon Oms Other Clinician: Referring Holley Wirt: Lenon Oms Treating Georgio Hattabaugh/Extender: Altamese [place] in Treatment: 0 Clinic Level of Care Assessment Items TOOL 1 Quantity Score X - Use when EandM and Procedure is performed on INITIAL visit 1 0 ASSESSMENTS - Nursing Assessment / Reassessment X - General Physical Exam (combine w/ comprehensive assessment (listed just below) when performed on new 1 20 pt. evals) X- 1 25 Comprehensive Assessment (HX, ROS, Risk Assessments, Wounds Hx, etc.) ASSESSMENTS - Wound and Skin Assessment / Reassessment []  - Dermatologic / Skin Assessment (not related to wound area) 0 ASSESSMENTS - Ostomy and/or Continence Assessment and Care []  - Incontinence Assessment and Management 0 []  - 0 Ostomy Care Assessment and Management (repouching, etc.) PROCESS - Coordination of Care X - Simple Patient / Family Education for ongoing care 1 15 []  - 0 Complex (extensive) Patient / Family Education for ongoing care X- 1 10 Staff obtains , Records, Test Results / Process Orders []  - 0 Staff telephones HHA, Nursing Homes / Clarify orders / etc []  - 0 Routine Transfer to another Facility (non-emergent condition) []  - 0 Routine Hospital Admission (non-emergent condition) []  - 0 New Admissions / / Ordering NPWT, Apligraf, etc. []  - 0 Emergency Hospital Admission (emergent condition) PROCESS - Special Needs []  - Pediatric / Minor Patient Management 0 []  - 0 Isolation Patient Management []  - 0 Hearing / Language / Visual special needs []  - 0 Assessment of Community assistance (transportation, D/C planning, etc.) []  - 0 Additional assistance / Altered mentation []  - 0 Support Surface(s) Assessment (bed, cushion, seat, etc.) INTERVENTIONS - Miscellaneous []  - External ear exam 0 []  - 0 Patient Transfer (multiple staff / / Similar devices) []  - 0 Simple Staple / Suture  removal (25 or less) []  - 0 Complex Staple / Suture removal (26 or more) []  - 0 Hypo/Hyperglycemic Management (do not check if billed separately) X- 1 15 Ankle / Brachial Index (ABI) - do not check if billed separately Has the patient been seen at the hospital within the last three  years: Yes Total Score: 85 Level Of Care: New/Established - Level 3 Rodney IdolRKER, Algenis R. ([id number]) Electronic Signature(s) Signed: 11/04/2020 1:03:43 PM By: Phillis HaggisSanchez Pereyda, Dondra PraderKenia RN Entered By: Phillis HaggisSanchez Pereyda, Kenia on 11/04/2020 09:21:53 Rodney IdolPARKER, Rodney R. ([id number]) -------------------------------------------------------------------------------- Compression Therapy Details Patient Name: Rodney IdolRKER, Rodney R. Date of Service: 11/04/2020 8:30 AM Medical Record Number: [id number] Patient Account Number: [id number] Date of Birth/Sex: 01/15/1956 (65 y.o. M) Treating RN: Rogers BlockerSanchez, Kenia Primary Care Sarayu Prevost: Lenon OmsGauger, Sarah Other Clinician: Referring Brendon Christoffel: Lenon OmsGauger, Sarah Treating Courtney Fenlon/Extender: Maxwell CaulOBSON, MICHAEL G Weeks in Treatment: 0 Compression Therapy Performed for Wound Assessment: Wound #1 Left,Medial Lower Leg Performed By: Ovidio Hangerlinician Sanchez, Kenia, RN Compression Type: Four Layer Pre Treatment ABI: 0.9 Post Procedure Diagnosis Same as Pre-procedure Electronic Signature(s) Signed: 11/04/2020 1:03:43 PM By: Phillis HaggisSanchez Pereyda, Dondra PraderKenia RN Entered By: Phillis HaggisSanchez Pereyda, Dondra PraderKenia on 11/04/2020 09:19:47 Rodney IdolPARKER, Rodney R. ([id number]) -------------------------------------------------------------------------------- Encounter Discharge Information Details Patient Name: Rodney IdolRKER, Rodney R. Date of Service: 11/04/2020 8:30 AM Medical Record Number: [id number] Patient Account Number: [id number] Date of Birth/Sex: 10/17/1955 (65 y.o. M) Treating RN: Yevonne PaxEpps, Carrie Primary Care Priti Consoli: Lenon OmsGauger, Sarah Other Clinician: Referring Marri Mcneff: Lenon OmsGauger, Sarah Treating Harvis Mabus/Extender: Altamese CarolinaOBSON, MICHAEL G Weeks in Treatment: 0 Encounter  Discharge Information Items Discharge Condition: Stable Ambulatory Status: Ambulatory Discharge Destination: Home Transportation: Private Auto Accompanied By: wife Schedule Follow-up Appointment: Yes Clinical Summary of Care: Patient Declined Electronic Signature(s) Signed: 11/06/2020 9:23:44 AM By: Yevonne PaxEpps, Carrie RN Entered By: Yevonne PaxEpps, Carrie on 11/04/2020 09:42:41 Rodney IdolPARKER, Jaquarius R. ([id number]) -------------------------------------------------------------------------------- Lower Extremity Assessment Details Patient Name: Rodney IdolRKER, Rodney R. Date of Service: 11/04/2020 8:30 AM Medical Record Number: [id number] Patient Account Number: [id number] Date of Birth/Sex: 07/20/1955 (65 y.o. M) Treating RN: Hansel FeinsteinBishop, Joy Primary Care Toccara Alford: Lenon OmsGauger, Sarah Other Clinician: Referring Rhya Shan: Lenon OmsGauger, Sarah Treating Mackey Varricchio/Extender: Altamese CarolinaOBSON, MICHAEL G Weeks in Treatment: 0 Edema Assessment Assessed: Kyra Searles[Left: Yes] [Right: No] Edema: [Left: N] [Right: o] Calf Left: Right: Point of Measurement: 34 cm From Medial Instep 42 cm Ankle Left: Right: Point of Measurement: 10 cm From Medial Instep 25.2 cm Knee To Floor Left: Right: From Medial Instep 43 cm Vascular Assessment Pulses: Dorsalis Pedis Doppler Audible: [Left:Yes] Posterior Tibial Doppler Audible: [Left:Yes] Blood Pressure: Brachial: [Left:120] Dorsalis Pedis: 80 Ankle: Posterior Tibial: 110 Ankle Brachial Index: [Left:0.92] Electronic Signature(s) Signed: 11/04/2020 2:11:15 PM By: Hansel FeinsteinBishop, Joy Entered By: Hansel FeinsteinBishop, Joy on 11/04/2020 09:03:33 Rodney IdolPARKER, Jaimere R. ([id number]) -------------------------------------------------------------------------------- Multi Wound Chart Details Patient Name: Rodney IdolPARKER, Rodney R. Date of Service: 11/04/2020 8:30 AM Medical Record Number: [id number] Patient Account Number: [id number] Date of Birth/Sex: 12/19/1955 (65 y.o. M) Treating RN: Rogers BlockerSanchez, Kenia Primary Care Leif Loflin: Lenon OmsGauger, Sarah Other Clinician: Referring  Rodney Oneal Dunagan: Lenon OmsGauger, Sarah Treating Reeder Brisby/Extender: Altamese CarolinaOBSON, MICHAEL G Weeks in Treatment: 0 Vital Signs Height(in): 71 Pulse(bpm): 75 Weight(lbs): 311 Blood Pressure(mmHg): 142/75 Body Mass Index(BMI): 43 Temperature(F): 98 Respiratory Rate(breaths/min): 20 Photos: [N/A:N/A] Wound Location: Left, Medial Lower Leg N/A N/A Wounding Event: Skin Tear/Laceration N/A N/A Primary Etiology: Trauma, Other N/A N/A Comorbid History: Cataracts, Sleep Apnea, N/A N/A Arrhythmia, Coronary Artery Disease, Hypertension, Type II Diabetes, Osteoarthritis Date Acquired: 10/21/2020 N/A N/A Weeks of Treatment: 0 N/A N/A Wound Status: Open N/A N/A Measurements L x W x D (cm) 2.5x1.5x0.1 N/A N/A Area (cm) : 2.945 N/A N/A Volume (cm) : 0.295 N/A N/A Classification: Full Thickness Without Exposed N/A N/A Support Structures Exudate Amount: None Present N/A N/A Granulation Amount: None Present (0%) N/A N/A Necrotic Amount: Large (67-100%) N/A N/A Necrotic Tissue: Eschar N/A N/A Exposed Structures: Fat Layer (Subcutaneous Tissue): N/A N/A Yes Fascia: No  Tendon: No Muscle: No Joint: No Bone: No Procedures Performed: Compression Therapy N/A N/A Treatment Notes Electronic Signature(s) Signed: 11/04/2020 4:14:58 PM By: Baltazar Najjar MD Entered By: Baltazar Najjar on 11/04/2020 09:32:52 Rodney Oneal ([id number]) -------------------------------------------------------------------------------- Multi-Disciplinary Care Plan Details Patient Name: Rodney Oneal, Rodney Oneal. Date of Service: 11/04/2020 8:30 AM Medical Record Number: [id number] Patient Account Number: [id number] Date of Birth/Sex: Jan 07, 1956 (65 y.o. M) Treating RN: Rogers Blocker Primary Care Lotta Frankenfield: Lenon Oms Other Clinician: Referring Nishka Heide: Lenon Oms Treating Avannah Decker/Extender: Altamese [place] in Treatment: 0 Active Inactive Necrotic Tissue Nursing Diagnoses: Impaired tissue integrity related to necrotic/devitalized  tissue Goals: Necrotic/devitalized tissue will be minimized in the wound bed Date Initiated: 11/04/2020 Target Resolution Date: 11/04/2020 Goal Status: Active Patient/caregiver will verbalize understanding of reason and process for debridement of necrotic tissue Date Initiated: 11/04/2020 Target Resolution Date: 11/04/2020 Goal Status: Active Interventions: Assess patient pain level pre-, during and post procedure and prior to discharge Provide education on necrotic tissue and debridement process Treatment Activities: Apply topical anesthetic as ordered : 11/04/2020 Enzymatic debridement : 11/04/2020 Excisional debridement : 11/04/2020 Notes: Orientation to the Wound Care Program Nursing Diagnoses: Knowledge deficit related to the wound healing center program Goals: Patient/caregiver will verbalize understanding of the Wound Healing Center Program Date Initiated: 11/04/2020 Target Resolution Date: 11/04/2020 Goal Status: Active Interventions: Provide education on orientation to the wound center Notes: Wound/Skin Impairment Nursing Diagnoses: Impaired tissue integrity Goals: Patient/caregiver will verbalize understanding of skin care regimen Date Initiated: 11/04/2020 Target Resolution Date: 11/04/2020 Goal Status: Active Ulcer/skin breakdown will have a volume reduction of 30% by week 4 Date Initiated: 11/04/2020 Target Resolution Date: 12/04/2020 Goal Status: Active Ulcer/skin breakdown will have a volume reduction of 50% by week 8 Date Initiated: 11/04/2020 Target Resolution Date: 01/04/2021 Goal Status: Active Rodney Oneal, Rodney Oneal ([id number]) Ulcer/skin breakdown will have a volume reduction of 80% by week 12 Date Initiated: 11/04/2020 Target Resolution Date: 02/04/2021 Goal Status: Active Ulcer/skin breakdown will heal within 14 weeks Date Initiated: 11/04/2020 Target Resolution Date: 03/06/2021 Goal Status: Active Interventions: Assess patient/caregiver ability to obtain necessary supplies Assess  patient/caregiver ability to perform ulcer/skin care regimen upon admission and as needed Assess ulceration(s) every visit Provide education on ulcer and skin care Treatment Activities: Referred to DME Heather Mckendree for dressing supplies : 11/04/2020 Skin care regimen initiated : 11/04/2020 Notes: Electronic Signature(s) Signed: 11/04/2020 1:03:43 PM By: Phillis Haggis, Dondra Prader RN Entered By: Phillis Haggis, Dondra Prader on 11/04/2020 09:17:22 Rodney Oneal ([id number]) -------------------------------------------------------------------------------- Pain Assessment Details Patient Name: Rodney Oneal. Date of Service: 11/04/2020 8:30 AM Medical Record Number: [id number] Patient Account Number: [id number] Date of Birth/Sex: 28-Dec-1955 (65 y.o. M) Treating RN: Hansel Feinstein Primary Care Makenzye Troutman: Lenon Oms Other Clinician: Referring Kalib Bhagat: Lenon Oms Treating Lakecia Deschamps/Extender: Altamese [place] in Treatment: 0 Active Problems Location of Pain Severity and Description of Pain Patient Has Paino Yes Site Locations Pain Location: Pain in Ulcers Rate the pain. Current Pain Level: 5 Pain Management and Medication Current Pain Management: Electronic Signature(s) Signed: 11/04/2020 2:11:15 PM By: Hansel Feinstein Entered By: Hansel Feinstein on 11/04/2020 08:39:43 Rodney Oneal ([id number]) -------------------------------------------------------------------------------- Patient/Caregiver Education Details Patient Name: Rodney Oneal. Date of Service: 11/04/2020 8:30 AM Medical Record Number: [id number] Patient Account Number: [id number] Date of Birth/Gender: [birth year] (65 y.o. M) Treating RN: Rogers Blocker Primary Care Physician: Lenon Oms Other Clinician: Referring Physician: Lenon Oms Treating Physician/Extender: Altamese [place] in Treatment: 0 Education Assessment Education Provided To: Patient Education Topics Provided Welcome To The Wound  Care Center: Methods:  Explain/Verbal Responses: State content correctly Wound/Skin Impairment: Methods: Explain/Verbal Responses: State content correctly Electronic Signature(s) Signed: 11/04/2020 1:03:43 PM By: Phillis Haggis, Dondra Prader RN Entered By: Phillis Haggis, Dondra Prader on 11/04/2020 09:22:13 OMARRION, CARMER ([id number]) -------------------------------------------------------------------------------- Wound Assessment Details Patient Name: Rodney Oneal, Rodney Oneal. Date of Service: 11/04/2020 8:30 AM Medical Record Number: [id number] Patient Account Number: [id number] Date of Birth/Sex: 09/26/1955 (65 y.o. M) Treating RN: Hansel Feinstein Primary Care Emireth Cockerham: Lenon Oms Other Clinician: Referring Margean Korell: Lenon Oms Treating Taylore Hinde/Extender: Altamese [place] in Treatment: 0 Wound Status Wound Number: 1 Primary Trauma, Other Etiology: Wound Location: Left, Medial Lower Leg Wound Open Wounding Event: Skin Tear/Laceration Status: Date Acquired: 10/21/2020 Comorbid Cataracts, Sleep Apnea, Arrhythmia, Coronary Artery Weeks Of Treatment: 0 History: Disease, Hypertension, Type II Diabetes, Osteoarthritis Clustered Wound: No Photos Wound Measurements Length: (cm) 2.5 Width: (cm) 1.5 Depth: (cm) 0.1 Area: (cm) 2.945 Volume: (cm) 0.295 % Reduction in Area: % Reduction in Volume: Tunneling: No Undermining: No Wound Description Classification: Full Thickness Without Exposed Support Structures Exudate Amount: None Present Foul Odor After Cleansing: No Slough/Fibrino Yes Wound Bed Granulation Amount: None Present (0%) Exposed Structure Necrotic Amount: Large (67-100%) Fascia Exposed: No Necrotic Quality: Eschar Fat Layer (Subcutaneous Tissue) Exposed: Yes Tendon Exposed: No Muscle Exposed: No Joint Exposed: No Bone Exposed: No Treatment Notes Wound #1 (Lower Leg) Wound Laterality: Left, Medial Cleanser Soap and Water Discharge Instruction: Gently cleanse wound with antibacterial soap, rinse  and pat dry prior to dressing wounds Peri-Wound Care Topical ACEN, CRAUN ([id number]) Primary Dressing Calcium Alginate Dressing, 6x6 (in/in) Secondary Dressing ABD Pad 5x9 (in/in) Discharge Instruction: Cover with ABD pad Secured With Compression Wrap Medichoice 4 layer Compression System, 35-40 mmHG Discharge Instruction: Apply multi-layer wrap as directed. Compression Stockings Add-Ons Electronic Signature(s) Signed: 11/04/2020 2:11:15 PM By: Hansel Feinstein Entered By: Hansel Feinstein on 11/04/2020 08:55:18 Rodney Oneal ([id number]) -------------------------------------------------------------------------------- Vitals Details Patient Name: Rodney Oneal. Date of Service: 11/04/2020 8:30 AM Medical Record Number: [id number] Patient Account Number: [id number] Date of Birth/Sex: 04-12-1956 (65 y.o. M) Treating RN: Hansel Feinstein Primary Care Danecia Underdown: Lenon Oms Other Clinician: Referring Alexiah Koroma: Lenon Oms Treating [place] Nordquist/Extender: Altamese [place] in Treatment: 0 Vital Signs Time Taken: 08:40 Temperature (F): 98 Height (in): 71 Pulse (bpm): 75 Source: Stated Respiratory Rate (breaths/min): 20 Weight (lbs): 311 Blood Pressure (mmHg): 142/75 Source: Measured Reference Range: 80 - 120 mg / dl Body Mass Index (BMI): 43.4 Electronic Signature(s) Signed: 11/04/2020 2:11:15 PM By: Hansel Feinstein Entered ByHansel Feinstein on 11/04/2020 08:40:53

## 2020-11-11 ENCOUNTER — Other Ambulatory Visit: Payer: Self-pay

## 2020-11-11 ENCOUNTER — Encounter: Payer: Medicare Other | Admitting: Internal Medicine

## 2020-11-11 DIAGNOSIS — E11622 Type 2 diabetes mellitus with other skin ulcer: Secondary | ICD-10-CM | POA: Diagnosis not present

## 2020-11-12 NOTE — Progress Notes (Signed)
[name], [name] ([id number]) Visit Report for 11/11/2020 Debridement Details Patient Name: Rodney Oneal, Rodney Oneal. Date of Service: 11/11/2020 10:00 AM Medical Record Number: [id number] Patient Account Number: [id number] Date of Birth/Sex: Dec 28, 1955 (65 y.o. Male) Treating RN: Huel Coventry Primary Care Provider: Lenon Oms Other Clinician: Referring Provider: Lenon Oms Treating Provider/Extender: Altamese [place] in Treatment: 1 Debridement Performed for Wound #1 Left,Medial Lower Leg Assessment: Performed By: Physician Maxwell Caul, MD Debridement Type: Debridement Level of Consciousness (Pre- Awake and Alert procedure): Pre-procedure Verification/Time Out Yes - 10:39 Taken: Total Area Debrided (L x W): 2.7 (cm) x 1.5 (cm) = 4.05 (cm) Tissue and other material Viable, Non-Viable, Blood Clots, Subcutaneous, Skin: Dermis , Skin: Epidermis debrided: Level: Skin/Subcutaneous Tissue Debridement Description: Excisional Instrument: Blade, Curette, Scissors Bleeding: Large Hemostasis Achieved: Pressure Response to Treatment: Procedure was tolerated well Level of Consciousness (Post- Awake and Alert procedure): Post Debridement Measurements of Total Wound Length: (cm) 2.7 Width: (cm) 1.5 Depth: (cm) 2.5 Volume: (cm) 7.952 Character of Wound/Ulcer Post Debridement: Stable Post Procedure Diagnosis Same as Pre-procedure Electronic Signature(s) Signed: 11/11/2020 4:10:05 PM By: Baltazar Najjar MD Signed: 11/11/2020 5:10:04 PM By: Elliot Gurney, BSN, RN, CWS, Kim RN, BSN Entered By: Baltazar Najjar on 11/11/2020 11:06:18 ISHAM, SMITHERMAN ([id number]) -------------------------------------------------------------------------------- HPI Details Patient Name: Rodney Oneal, Rodney Oneal. Date of Service: 11/11/2020 10:00 AM Medical Record Number: [id number] Patient Account Number: [id number] Date of Birth/Sex: [birth year] (65 y.o. Male) Treating RN: Huel Coventry Primary Care Provider: Lenon Oms  Other Clinician: Referring Provider: Lenon Oms Treating Provider/Extender: Maxwell Caul Weeks in Treatment: 1 History of Present Illness HPI Description: ADMISSION 11/04/2020 This is a 65 year old man who is on chronic Coumadin secondary to atrial fibrillation. He had a fall on 5/25 out of bed but he managed to hit his anterior left leg on a tool that was lying in the vicinity. He was seen at his primary doctor's office on 10/30/2020 and there was a concern for cellulitis he was put on cephalexin 500 3 times daily that he still taking. He currently has a significant swelling with some nonviable skin on top of it. He says this is begin leaking but it is not frankly open. He is not complaining of a lot of pain. Past medical history includes type 2 diabetes, chronic A. fib on Coumadin, hypertension, history of a CVA ABI in this clinic was 0.92 on the left 6/15 left medial lower leg hematoma secondary to trauma on Coumadin. The patient arrives back in clinic with a week of 4-layer compression with calcium alginate with the area on his left medial lower leg just above the ankle looking completely the same. No evidence that this is reabsorbing. Still very tight. I do not see any evidence of infection however I was hoping for some improvement in the amount of periwound swelling. Electronic Signature(s) Signed: 11/11/2020 4:10:05 PM By: Baltazar Najjar MD Entered By: Baltazar Najjar on 11/11/2020 11:07:36 Rodney Oneal ([id number]) -------------------------------------------------------------------------------- Physical Exam Details Patient Name: Rodney Oneal, Rodney Oneal. Date of Service: 11/11/2020 10:00 AM Medical Record Number: [id number] Patient Account Number: [id number] Date of Birth/Sex: 1956/03/10 (65 y.o. Male) Treating RN: Huel Coventry Primary Care Provider: Lenon Oms Other Clinician: Referring Provider: Lenon Oms Treating Provider/Extender: Maxwell Caul Weeks in Treatment:  1 Constitutional Patient is hypertensive.. Pulse regular and within target range for patient.Marland Kitchen Respirations regular, non-labored and within target range.. Temperature is normal and within the target range for the patient.Marland Kitchen appears in no distress. Cardiovascular Needle pulses are palpable.. Integumentary (Hair,  Skin) Area of localized erythema on the dorsal foot I think collection of where the wrap had left some fluid.. Notes Wound exam; areas on the left anterior lateral lower leg. 2 small black eschared areas remain. However the golf ball size swelling was just as tight as last week which was disappointing. I had some thoughts about whether to leave the compression on another week after thought I elected to remove the eschared areas. Drained the copious amounts of gelatinous blood underneath. Unfortunately this undermining superiorly by a considerable degree 4 cm from about 12-6 o'clock. No evidence of infection Electronic Signature(s) Signed: 11/11/2020 4:10:05 PM By: Baltazar Najjar MD Entered By: Baltazar Najjar on 11/11/2020 11:09:12 Rodney Oneal ([id number]) -------------------------------------------------------------------------------- Physician Orders Details Patient Name: Rodney Oneal, Rodney Oneal. Date of Service: 11/11/2020 10:00 AM Medical Record Number: [id number] Patient Account Number: [id number] Date of Birth/Sex: September 04, 1955 (65 y.o. Male) Treating RN: Huel Coventry Primary Care Provider: Lenon Oms Other Clinician: Referring Provider: Lenon Oms Treating Provider/Extender: Altamese [place] in Treatment: 1 Verbal / Phone Orders: No Diagnosis Coding Follow-up Appointments o Return Appointment in 1 week. o Nurse Visit as needed Bathing/ Shower/ Hygiene o May shower with wound dressing protected with water repellent cover or cast protector. o No tub bath. Edema Control - Lymphedema / Segmental Compressive Device / Other o Optional: One layer of unna paste to  top of compression wrap (to act as an anchor). Medications-Please add to medication list. o Other: - Tylenol for pain Wound Treatment Wound #1 - Lower Leg Wound Laterality: Left, Medial Cleanser: Soap and Water 1 x Per Week/15 Days Discharge Instructions: Gently cleanse wound with antibacterial soap, rinse and pat dry prior to dressing wounds Primary Dressing: Calcium Alginate Dressing, 6x6 (in/in) 1 x Per Week/15 Days Discharge Instructions: pack lightly into wound Secondary Dressing: Xtrasorb Medium 4x5 (in/in) 1 x Per Week/15 Days Discharge Instructions: Apply to wound as directed. Do not cut. Compression Wrap: Medichoice 4 layer Compression System, 35-40 mmHG 1 x Per Week/15 Days Discharge Instructions: Apply multi-layer wrap as directed. Electronic Signature(s) Signed: 11/11/2020 4:10:05 PM By: Baltazar Najjar MD Signed: 11/11/2020 5:10:04 PM By: Elliot Gurney, BSN, RN, CWS, Kim RN, BSN Entered By: Elliot Gurney, BSN, RN, CWS, Kim on 11/11/2020 10:47:32 CIRILO, CANNER ([id number]) -------------------------------------------------------------------------------- Problem List Details Patient Name: Rodney Oneal, Rodney Oneal. Date of Service: 11/11/2020 10:00 AM Medical Record Number: [id number] Patient Account Number: [id number] Date of Birth/Sex: 1955-11-21 (65 y.o. 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 Date of Service: 11/11/2020 10:00 AM Medical Record Number: [id number] Patient Account Number: [id number] Date of Birth/Sex: 18-Feb-1956 (65 y.o. Male) Treating RN: Huel Coventry Primary Care Provider: Lenon Oms Other Clinician: Referring Provider: Lenon Oms Treating Provider/Extender: Maxwell Caul Weeks in Treatment: 1 Subjective History of Present Illness (HPI) ADMISSION 11/04/2020 This is a 65 year old man who is on chronic Coumadin secondary to atrial fibrillation. He had a fall on 5/25 out of  bed but he managed to hit his anterior left leg on a tool that was lying in the vicinity. He was seen at his primary doctor's office on 10/30/2020 and there was a concern for cellulitis he was put on cephalexin 500 3 times daily that he still taking. He currently has a significant swelling with some nonviable skin on top of it. He says this is begin leaking but it is not frankly open. He is not complaining of a lot of pain. Past medical history includes type 2 diabetes, chronic A. fib on Coumadin, hypertension, history of a CVA ABI in this clinic was 0.92 on the left 6/15 left medial lower leg hematoma secondary to trauma on Coumadin. The patient arrives back in clinic with a week of 4-layer compression with calcium alginate with the area on his left medial lower leg just above the ankle looking completely the same. No evidence that this is reabsorbing. Still very tight. I do not see any evidence of infection however I was hoping for some improvement in the amount of periwound swelling. Objective Constitutional Patient is hypertensive.. Pulse regular and within target range for patient.Marland Kitchen Respirations regular, non-labored and within target range.. Temperature is normal and within the target range  for the patient.Marland Kitchen appears in no distress. Vitals Time Taken: 10:16 AM, Height: 71 in, Weight: 311 lbs, BMI: 43.4, Temperature: 98.4 F, Pulse: 91 bpm, Respiratory Rate: 20 breaths/min, Blood Pressure: 152/71 mmHg. Cardiovascular Needle pulses are palpable.. General Notes: Wound exam; areas on the left anterior lateral lower leg. 2 small black eschared areas remain. However the golf ball size swelling was just as tight as last week which was disappointing. I had some thoughts about whether to leave the compression on another week after thought I elected to remove the eschared areas. Drained the copious amounts of gelatinous blood underneath. Unfortunately this undermining superiorly by a considerable degree 4 cm from about 12-6 o'clock. No evidence of infection Integumentary (Hair, Skin) Area of localized erythema on the dorsal foot I think collection of where the wrap had left some fluid.. Wound #1 status is Open. Original cause of wound was Skin Tear/Laceration. The date acquired was: 10/21/2020. The wound has been in treatment 1 weeks. The wound is located on the Left,Medial Lower Leg. The wound measures 2.7cm length x 1.5cm width x 2.5cm depth; 3.181cm^2 area and 7.952cm^3 volume. There is Fat Layer (Subcutaneous Tissue) exposed. There is no tunneling noted, however, there is undermining starting at 12:00 and ending at 12:00 with a maximum distance of 4cm. There is a large amount of sanguinous drainage noted. There is no granulation within the wound bed. There is a large (67-100%) amount of necrotic tissue within the wound bed including Eschar. Assessment Active Problems ICD-10 Contusion of left lower leg, subsequent encounter Non-pressure chronic ulcer of other part of left lower leg with other specified severity Rodney Oneal, Rodney Oneal. ([id number]) Adverse effect of anticoagulants, sequela Procedures Wound #1 Pre-procedure diagnosis of Wound #1 is a Trauma, Other located on the Left,Medial  Lower Leg . There was a Excisional Skin/Subcutaneous Tissue Debridement with a total area of 4.05 sq cm performed by Maxwell Caul, MD. With the following instrument(s): Blade, Curette, and Scissors to remove Viable and Non-Viable tissue/material. Material removed includes Blood Clots, Subcutaneous Tissue, Skin: Dermis, and Skin: Epidermis. No specimens were taken. A time out was conducted at 10:39, prior to the start of the procedure. A Large amount of bleeding was controlled with Pressure. The procedure was tolerated well. Post  Debridement Measurements: 2.7cm length x 1.5cm width x 2.5cm depth; 7.952cm^3 volume. Character of Wound/Ulcer Post Debridement is stable. Post procedure Diagnosis Wound #1: Same as Pre-Procedure Plan Follow-up Appointments: Return Appointment in 1 week. Nurse Visit as needed Bathing/ Shower/ Hygiene: May shower with wound dressing protected with water repellent cover or cast protector. No tub bath. Edema Control - Lymphedema / Segmental Compressive Device / Other: Optional: One layer of unna paste to top of compression wrap (to act as an anchor). Medications-Please add to medication list.: Other: - Tylenol for pain WOUND #1: - Lower Leg Wound Laterality: Left, Medial Cleanser: Soap and Water 1 x Per Week/15 Days Discharge Instructions: Gently cleanse wound with antibacterial soap, rinse and pat dry prior to dressing wounds Primary Dressing: Calcium Alginate Dressing, 6x6 (in/in) 1 x Per Week/15 Days Discharge Instructions: pack lightly into wound Secondary Dressing: Xtrasorb Medium 4x5 (in/in) 1 x Per Week/15 Days Discharge Instructions: Apply to wound as directed. Do not cut. Compression Wrap: Medichoice 4 layer Compression System, 35-40 mmHG 1 x Per Week/15 Days Discharge Instructions: Apply multi-layer wrap as directed. 1. After some consternation I elected to drain the hematoma. The area was not getting any softer with compression which was  unfortunate 2. Compared to the 2 small open necrotic areas a considerable amount of underlying gelatinous blood removed. 3. We will packed this area with silver alginate backing drawtex and put him back in 4 layer compression 4. Looking forward hopefully we can get tissue adherence over the underhand hanging area however. This does not happen a wound VAC may be necessary. But removal of the area of skin may be also necessary 5. I do not think there is any evidence of Electronic Signature(s) Signed: 11/11/2020 4:10:05 PM By: Baltazar Najjarobson, Murlean Seelye MD Entered By: Baltazar Najjarobson, Chemika [place] on 11/11/2020 11:10:56 Rodney Oneal, Rodney R. ([id number]) -------------------------------------------------------------------------------- SuperBill Details Patient Name: Rodney Oneal, Rodney R. Date of Service: 11/11/2020 Medical Record Number: [id number] Patient Account Number: [id number] Date of Birth/Sex: 03/02/1956 [age] y.o. Male) Treating RN: Huel CoventryWoody, Kim Primary Care Provider: Lenon OmsGauger, Sarah Other Clinician: Referring Provider: Lenon OmsGauger, Sarah Treating Provider/Extender: Maxwell CaulOBSON, Kamerin Axford G Weeks in Treatment: 1 Diagnosis Coding ICD-10 Codes Code Description [phone] Contusion of left lower leg, subsequent encounter 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 specified severity T45.515S Adverse effect of anticoagulants, sequela Facility Procedures CPT4 Code: [id number] Description: 11042 - DEB SUBQ TISSUE 20 SQ CM/< Modifier: Quantity: 1 CPT4 Code: Description: ICD-10 Diagnosis Description 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 spec S80.12XD Contusion of left lower leg, subsequent encounter Modifier: ified severity Quantity: Physician Procedures CPT4 Code: [id number] Description: 11042 - WC PHYS SUBQ TISS 20 SQ CM Modifier: Quantity: 1 CPT4 Code: Description: ICD-10 Diagnosis Description L97.828 Non-pressure chronic ulcer of other part of left lower leg with  other spec S80.12XD Contusion of left lower leg, subsequent encounter Modifier: ified severity Quantity: Electronic Signature(s) Signed: 11/11/2020 4:10:05 PM By: Baltazar Najjarobson, Alando Colleran MD Entered By: Baltazar Najjarobson, Ruth Tully on 11/11/2020 11:11:12

## 2020-11-16 NOTE — Progress Notes (Signed)
TRAVIOUS, VANOVER ([id number]) Visit Report for 11/11/2020 Arrival Information Details Patient Name: Rodney Oneal, Rodney Oneal. Date of Service: 11/11/2020 10:00 AM Medical Record Number: [id number] Patient Account Number: [id number] Date of Birth/Sex: Jan 23, 1956 (65 y.o. Male) Treating RN: Hansel Feinstein Primary Care Oreatha Fabry: Lenon Oms Other Clinician: Referring Wynelle Dreier: Lenon Oms Treating Oaklynn Stierwalt/Extender: Altamese [place] in Treatment: 1 Visit Information History Since Last Visit Added or deleted any medications: No Patient Arrived: Ambulatory Had a fall or experienced change in No Arrival Time: 10:15 activities of daily living that may affect Accompanied By: wife risk of falls: Transfer Assistance: None Hospitalized since last visit: No Patient Identification Verified: Yes Has Dressing in Place as Prescribed: Yes Secondary Verification Process Completed: Yes Has Compression in Place as Prescribed: Yes Patient Has Alerts: Yes Pain Present Now: No Patient Alerts: Patient on Blood Thinner DIABETIC COUMADIN Electronic Signature(s) Signed: 11/16/2020 11:12:12 AM By: Hansel Feinstein Entered By: Hansel Feinstein on 11/11/2020 10:19:12 [name] ([id number]) -------------------------------------------------------------------------------- Encounter Discharge Information Details Patient Name: JEQUAN, SHAHIN R. Date of Service: 11/11/2020 10:00 AM Medical Record Number: [id number] Patient Account Number: [id number] Date of Birth/Sex: [birth year] (65 y.o. Male) Treating RN: Hansel Feinstein Primary Care Zeta Bucy: Lenon Oms Other Clinician: Referring Haevyn Ury: Lenon Oms Treating Kareemah Grounds/Extender: Altamese [place] in Treatment: 1 Encounter Discharge Information Items Post Procedure Vitals Discharge Condition: Stable Temperature (F): 98.4 Ambulatory Status: Ambulatory Pulse (bpm): 91 Discharge Destination: Home Respiratory Rate (breaths/min): 20 Transportation: Private  Auto Blood Pressure (mmHg): 152/80 Accompanied By: wife Schedule Follow-up Appointment: Yes Clinical Summary of Care: Electronic Signature(s) Signed: 11/16/2020 11:12:12 AM By: Hansel Feinstein Entered By: Hansel Feinstein on 11/11/2020 11:02:45 [name] ([id number]) -------------------------------------------------------------------------------- Lower Extremity Assessment Details Patient Name: [name]. Date of Service: 11/11/2020 10:00 AM Medical Record Number: [id number] Patient Account Number: [id number] Date of Birth/Sex: 1955/08/01 (65 y.o. Male) Treating RN: Hansel Feinstein Primary Care Abbigal Radich: Lenon Oms Other Clinician: Referring Maryna Yeagle: Lenon Oms Treating Sie Formisano/Extender: Maxwell Caul Weeks in Treatment: 1 Edema Assessment Assessed: Kyra Searles: Yes] [Right: No] Edema: [Left: Ye] [Right: s] Calf Left: Right: Point of Measurement: 34 cm From Medial Instep 41 cm Ankle Left: Right: Point of Measurement: 10 cm From Medial Instep 23.5 cm Vascular Assessment Pulses: Dorsalis Pedis Palpable: [Left:Yes] Electronic Signature(s) Signed: 11/16/2020 11:12:12 AM By: Hansel Feinstein Entered By: Hansel Feinstein on 11/11/2020 10:27:17 [name] ([id number]) -------------------------------------------------------------------------------- Multi Wound Chart Details Patient Name: [name]. Date of Service: 11/11/2020 10:00 AM Medical Record Number: [id number] Patient Account Number: [id number] Date of Birth/Sex: 17-May-1956 (65 y.o. Male) Treating RN: Huel Coventry Primary Care Tykee Heideman: Lenon Oms Other Clinician: Referring Tram Wrenn: Lenon Oms Treating Tresa Jolley/Extender: Maxwell Caul Weeks in Treatment: 1 Vital Signs Height(in): 71 Pulse(bpm): 91 Weight(lbs): 311 Blood Pressure(mmHg): 152/71 Body Mass Index(BMI): 43 Temperature(F): 98.4 Respiratory Rate(breaths/min): 20 Photos: [N/A:N/A] Wound Location: Left, Medial Lower Leg N/A N/A Wounding Event:  Skin Tear/Laceration N/A N/A Primary Etiology: Trauma, Other N/A N/A Comorbid History: Cataracts, Sleep Apnea, N/A N/A Arrhythmia, Coronary Artery Disease, Hypertension, Type II Diabetes, Osteoarthritis Date Acquired: 10/21/2020 N/A N/A Weeks of Treatment: 1 N/A N/A Wound Status: Open N/A N/A Measurements L x W x D (cm) 2.7x1.5x2.5 N/A N/A Area (cm) : 3.181 N/A N/A Volume (cm) : 7.952 N/A N/A % Reduction in Area: -8.00% N/A N/A % Reduction in Volume: -2595.60% N/A N/A Starting Position 1 (o'clock): 12 Ending Position 1 (o'clock): 12 Maximum Distance 1 (cm): 4 Undermining: Yes N/A N/A Classification: Full Thickness Without Exposed N/A N/A Support  Structures Exudate Amount: Large N/A N/A Exudate Type: Sanguinous N/A N/A Exudate Color: red N/A N/A Granulation Amount: None Present (0%) N/A N/A Necrotic Amount: Large (67-100%) N/A N/A Necrotic Tissue: Eschar N/A N/A Exposed Structures: Fat Layer (Subcutaneous Tissue): N/A N/A Yes Fascia: No Tendon: No Muscle: No Lurline IdolRKER, Shaurya R. ([id number]) Joint: No Bone: No Debridement: Debridement - Selective/Open N/A N/A Wound Pre-procedure Verification/Time 10:39 N/A N/A Out Taken: Tissue Debrided: Blood Clots N/A N/A Level: Skin/Epidermis N/A N/A Debridement Area (sq cm): 4.05 N/A N/A Instrument: Blade, Curette, Scissors N/A N/A Bleeding: Large N/A N/A Hemostasis Achieved: Pressure N/A N/A Debridement Treatment Procedure was tolerated well N/A N/A Response: Post Debridement 2.7x1.5x2.5 N/A N/A Measurements L x W x D (cm) Post Debridement Volume: 7.952 N/A N/A (cm) Procedures Performed: Debridement N/A N/A Treatment Notes Wound #1 (Lower Leg) Wound Laterality: Left, Medial Cleanser Soap and Water Discharge Instruction: Gently cleanse wound with antibacterial soap, rinse and pat dry prior to dressing wounds Peri-Wound Care Topical Primary Dressing Calcium Alginate Dressing, 6x6 (in/in) Discharge Instruction: pack lightly  into wound Secondary Dressing Xtrasorb Medium 4x5 (in/in) Discharge Instruction: Apply to wound as directed. Do not cut. Secured With Compression Wrap Medichoice 4 layer Compression System, 35-40 mmHG Discharge Instruction: Apply multi-layer wrap as directed. Compression Stockings Add-Ons Electronic Signature(s) Signed: 11/11/2020 4:10:05 PM By: Baltazar Najjarobson, Michael MD Entered By: Baltazar Najjarobson, Michael on 11/11/2020 11:05:57 Lurline IdolPARKER, Maxximus R. ([id number]) -------------------------------------------------------------------------------- Multi-Disciplinary Care Plan Details Patient Name: Lurline IdolRKER, Jacson R. Date of Service: 11/11/2020 10:00 AM Medical Record Number: [id number] Patient Account Number: [id number] Date of Birth/Sex: 10/15/1955 41(65 y.o. Male) Treating RN: Huel CoventryWoody, Kim Primary Care Hyla Coard: Lenon OmsGauger, Sarah Other Clinician: Referring Vienne Corcoran: Lenon OmsGauger, Sarah Treating Avynn Klassen/Extender: Altamese CarolinaOBSON, MICHAEL G Weeks in Treatment: 1 Active Inactive Necrotic Tissue Nursing Diagnoses: Impaired tissue integrity related to necrotic/devitalized tissue Goals: Necrotic/devitalized tissue will be minimized in the wound bed Date Initiated: 11/04/2020 Target Resolution Date: 11/04/2020 Goal Status: Active Patient/caregiver will verbalize understanding of reason and process for debridement of necrotic tissue Date Initiated: 11/04/2020 Target Resolution Date: 11/04/2020 Goal Status: Active Interventions: Assess patient pain level pre-, during and post procedure and prior to discharge Provide education on necrotic tissue and debridement process Treatment Activities: Apply topical anesthetic as ordered : 11/04/2020 Enzymatic debridement : 11/04/2020 Excisional debridement : 11/04/2020 Notes: Orientation to the Wound Care Program Nursing Diagnoses: Knowledge deficit related to the wound healing center program Goals: Patient/caregiver will verbalize understanding of the Wound Healing Center Program Date Initiated:  11/04/2020 Target Resolution Date: 11/04/2020 Goal Status: Active Interventions: Provide education on orientation to the wound center Notes: Wound/Skin Impairment Nursing Diagnoses: Impaired tissue integrity Goals: Patient/caregiver will verbalize understanding of skin care regimen Date Initiated: 11/04/2020 Target Resolution Date: 11/04/2020 Goal Status: Active Ulcer/skin breakdown will have a volume reduction of 30% by week 4 Date Initiated: 11/04/2020 Target Resolution Date: 12/04/2020 Goal Status: Active Ulcer/skin breakdown will have a volume reduction of 50% by week 8 Date Initiated: 11/04/2020 Target Resolution Date: 01/04/2021 Goal Status: Active Lurline IdolRKER, Thompson R. ([id number]) Ulcer/skin breakdown will have a volume reduction of 80% by week 12 Date Initiated: 11/04/2020 Target Resolution Date: 02/04/2021 Goal Status: Active Ulcer/skin breakdown will heal within 14 weeks Date Initiated: 11/04/2020 Target Resolution Date: 03/06/2021 Goal Status: Active Interventions: Assess patient/caregiver ability to obtain necessary supplies Assess patient/caregiver ability to perform ulcer/skin care regimen upon admission and as needed Assess ulceration(s) every visit Provide education on ulcer and skin care Treatment Activities: Referred to DME Tivon Lemoine for dressing supplies : 11/04/2020 Skin care regimen  initiated : 11/04/2020 Notes: Electronic Signature(s) Signed: 11/11/2020 5:10:04 PM By: Elliot Gurney, BSN, RN, CWS, Kim RN, BSN Entered By: Elliot Gurney, BSN, RN, CWS, Kim on 11/11/2020 10:37:51 JADIER, ROCKERS ([id number]) -------------------------------------------------------------------------------- Pain Assessment Details Patient Name: RODGERS, LIKES. Date of Service: 11/11/2020 10:00 AM Medical Record Number: [id number] Patient Account Number: [id number] Date of Birth/Sex: 08-Feb-1956 (65 y.o. Male) Treating RN: Hansel Feinstein Primary Care Joanna Borawski: Lenon Oms Other Clinician: Referring Harris Kistler: Lenon Oms Treating Keri Veale/Extender: Maxwell Caul Weeks in Treatment: 1 Active Problems Location of Pain Severity and Description of Pain Patient Has Paino No Site Locations Rate the pain. Current Pain Level: 0 Pain Management and Medication Current Pain Management: Electronic Signature(s) Signed: 11/16/2020 11:12:12 AM By: Hansel Feinstein Entered By: Hansel Feinstein on 11/11/2020 10:20:16 [name] ([id number]) -------------------------------------------------------------------------------- Patient/Caregiver Education Details Patient Name: [name]. Date of Service: 11/11/2020 10:00 AM Medical Record Number: [id number] Patient Account Number: [id number] Date of Birth/Gender: September 19, 1955 (65 y.o. Male) Treating RN: Huel Coventry Primary Care Physician: Lenon Oms Other Clinician: Referring Physician: Lenon Oms Treating Physician/Extender: Altamese [place] in Treatment: 1 Education Assessment Education Provided To: Patient Education Topics Provided Wound Debridement: Handouts: Wound Debridement, Other: Tylenol for pain Methods: Demonstration, Explain/Verbal Responses: State content correctly Wound/Skin Impairment: Handouts: Caring for Your Ulcer Methods: Demonstration, Explain/Verbal Responses: State content correctly Electronic Signature(s) Signed: 11/11/2020 5:10:04 PM By: Elliot Gurney, BSN, RN, CWS, Kim RN, BSN Entered By: Elliot Gurney, BSN, RN, CWS, Kim on 11/11/2020 10:49:28 JUANYA, VILLAVICENCIO ([id number]) -------------------------------------------------------------------------------- Wound Assessment Details Patient Name: AUNDREA, HIGGINBOTHAM. Date of Service: 11/11/2020 10:00 AM Medical Record Number: [id number] Patient Account Number: [id number] Date of Birth/Sex: [birth year] (65 y.o. Male) Treating RN: Huel Coventry Primary Care Tyrique Sporn: Lenon Oms Other Clinician: Referring Claribel Sachs: Lenon Oms Treating Marquee Fuchs/Extender: Maxwell Caul Weeks in Treatment:  1 Wound Status Wound Number: 1 Primary Trauma, Other Etiology: Wound Location: Left, Medial Lower Leg Wound Open Wounding Event: Skin Tear/Laceration Status: Date Acquired: 10/21/2020 Comorbid Cataracts, Sleep Apnea, Arrhythmia, Coronary Artery Weeks Of Treatment: 1 History: Disease, Hypertension, Type II Diabetes, Osteoarthritis Clustered Wound: No Photos Wound Measurements Length: (cm) 2.7 Width: (cm) 1.5 Depth: (cm) 2.5 Area: (cm) 3.181 Volume: (cm) 7.952 % Reduction in Area: -8% % Reduction in Volume: -2595.6% Tunneling: No Undermining: Yes Starting Position (o'clock): 12 Ending Position (o'clock): 12 Maximum Distance: (cm) 4 Wound Description Classification: Full Thickness Without Exposed Support Structures Exudate Amount: Large Exudate Type: Sanguinous Exudate Color: red Foul Odor After Cleansing: No Slough/Fibrino No Wound Bed Granulation Amount: None Present (0%) Exposed Structure Necrotic Amount: Large (67-100%) Fascia Exposed: No Necrotic Quality: Eschar Fat Layer (Subcutaneous Tissue) Exposed: Yes Tendon Exposed: No Muscle Exposed: No Joint Exposed: No Bone Exposed: No Treatment Notes Wound #1 (Lower Leg) Wound Laterality: Left, Medial Cleanser Soap and Water TRAYTON, SZABO ([id number]) Discharge Instruction: Gently cleanse wound with antibacterial soap, rinse and pat dry prior to dressing wounds Peri-Wound Care Topical Primary Dressing Calcium Alginate Dressing, 6x6 (in/in) Discharge Instruction: pack lightly into wound Secondary Dressing Xtrasorb Medium 4x5 (in/in) Discharge Instruction: Apply to wound as directed. Do not cut. Secured With Compression Wrap Medichoice 4 layer Compression System, 35-40 mmHG Discharge Instruction: Apply multi-layer wrap as directed. Compression Stockings Add-Ons Electronic Signature(s) Signed: 11/11/2020 5:10:04 PM By: Elliot Gurney, BSN, RN, CWS, Kim RN, BSN Signed: 11/16/2020 11:12:12 AM By: Hansel Feinstein Entered  By: Hansel Feinstein on 11/11/2020 10:54:05 [name] ([id number]) -------------------------------------------------------------------------------- Vitals Details Patient Name: HAKIM, MINNIEFIELD. Date of Service: 11/11/2020 10:00 AM Medical  Record Number: [id number] Patient Account Number: [id number] Date of Birth/Sex: [birth year] (65 y.o. Male) Treating RN: Hansel Feinstein Primary Care Draydon Clairmont: Lenon Oms Other Clinician: Referring Aastha Dayley: Lenon Oms Treating Hiram Mciver/Extender: Altamese  in Treatment: 1 Vital Signs Time Taken: 10:16 Temperature (F): 98.4 Height (in): 71 Pulse (bpm): 91 Weight (lbs): 311 Respiratory Rate (breaths/min): 20 Body Mass Index (BMI): 43.4 Blood Pressure (mmHg): 152/71 Reference Range: 80 - 120 mg / dl Electronic Signature(s) Signed: 11/16/2020 11:12:12 AM By: Hansel Feinstein Entered ByHansel Feinstein on 11/11/2020 10:20:09

## 2020-11-18 ENCOUNTER — Encounter: Payer: Medicare Other | Admitting: Internal Medicine

## 2020-11-18 ENCOUNTER — Other Ambulatory Visit: Payer: Self-pay

## 2020-11-18 DIAGNOSIS — E11622 Type 2 diabetes mellitus with other skin ulcer: Secondary | ICD-10-CM | POA: Diagnosis not present

## 2020-11-18 NOTE — Progress Notes (Signed)
EZRIEL, BOFFA ([id number]) Visit Report for 11/18/2020 HPI Details Patient Name: Rodney Oneal, Rodney Oneal. Date of Service: 11/18/2020 10:00 AM Medical Record Number: [id number] Patient Account Number: [id number] Date of Birth/Sex: 1955/09/22 (65 y.o. M) Treating RN: Rogers Blocker Primary Care Provider: Lenon Oms Other Clinician: Referring Provider: Lenon Oms Treating Provider/Extender: Altamese [place] in Treatment: 2 History of Present Illness HPI Description: ADMISSION 11/04/2020 This is a 65 year old man who is on chronic Coumadin secondary to atrial fibrillation. He had a fall on 5/25 out of bed but he managed to hit his anterior left leg on a tool that was lying in the vicinity. He was seen at his primary doctor's office on 10/30/2020 and there was a concern for cellulitis he was put on cephalexin 500 3 times daily that he still taking. He currently has a significant swelling with some nonviable skin on top of it. He says this is begin leaking but it is not frankly open. He is not complaining of a lot of pain. Past medical history includes type 2 diabetes, chronic A. fib on Coumadin, hypertension, history of a CVA ABI in this clinic was 0.92 on the left 6/15 left medial lower leg hematoma secondary to trauma on Coumadin. The patient arrives back in clinic with a week of 4-layer compression with calcium alginate with the area on his left medial lower leg just above the ankle looking completely the same. No evidence that this is reabsorbing. Still very tight. I do not see any evidence of infection however I was hoping for some improvement in the amount of periwound swelling. 6/22; left medial lower leg hematoma secondary to trauma in a patient on chronic Coumadin. Last week I made the decision to open this. He had quite a bit of undermining in the deep wound. We use silver alginate. Really dramatic improvement in the undermining although the wound is still deep. Electronic  Signature(s) Signed: 11/18/2020 4:26:18 PM By: Baltazar Najjar MD Entered By: Baltazar Najjar on 11/18/2020 10:56:56 Rodney Oneal ([id number]) -------------------------------------------------------------------------------- Physical Exam Details Patient Name: Rodney Oneal, Rodney Oneal. Date of Service: 11/18/2020 10:00 AM Medical Record Number: [id number] Patient Account Number: [id number] Date of Birth/Sex: August 28, 1955 (65 y.o. M) Treating RN: Rogers Blocker Primary Care Provider: Lenon Oms Other Clinician: Referring Provider: Lenon Oms Treating Provider/Extender: Maxwell Caul We[place] in Treatment: 2 Constitutional Sitting or standing Blood Pressure is within target range for patient.. Pulse regular and within target range for patient.Marland Kitchen Respirations regular, non- labored and within target range.. Temperature is normal and within the target range for the patient.Marland Kitchen appears in no distress. Respiratory Respiratory effort is easy and symmetric bilaterally. Rate is normal at rest and on room air.. Cardiovascular Pedal pulses palpable and strong bilaterally.. Notes Wound exam; areas on the left anterior lower leg. Deep wound to however very significant improvement in the undermining there is been good tissue adherence under the compression. This was good to see. There is no exposed bone no evidence of infection bilaterally. Electronic Signature(s) Signed: 11/18/2020 4:26:18 PM By: Baltazar Najjar MD Entered By: Baltazar Najjar on 11/18/2020 10:58:09 Rodney Oneal ([id number]) -------------------------------------------------------------------------------- Physician Orders Details Patient Name: Rodney Oneal, Rodney Oneal. Date of Service: 11/18/2020 10:00 AM Medical Record Number: [id number] Patient Account Number: [id number] Date of Birth/Sex: 1955-10-18 (65 y.o. M) Treating RN: Rogers Blocker Primary Care Provider: Lenon Oms Other Clinician: Referring Provider: Lenon Oms Treating  Provider/Extender: Altamese [place] in Treatment: 2 Verbal / Phone Orders: No Diagnosis Coding Follow-up Appointments o Return Appointment in  1 week. o Nurse Visit as needed Bathing/ Shower/ Hygiene o May shower with wound dressing protected with water repellent cover or cast protector. o No tub bath. Edema Control - Lymphedema / Segmental Compressive Device / Other o Optional: One layer of unna paste to top of compression wrap (to act as an anchor). Medications-Please add to medication list. o Other: - Tylenol for pain Wound Treatment Wound #1 - Lower Leg Wound Laterality: Left, Medial Cleanser: Soap and Water 1 x Per Week/15 Days Discharge Instructions: Gently cleanse wound with antibacterial soap, rinse and pat dry prior to dressing wounds Primary Dressing: Gauze 1 x Per Week/15 Days Discharge Instructions: Dry gauze as bolster on top on hydrofera Primary Dressing: Hydrofera Blue Ready Transfer Foam, 2.5x2.5 (in/in) 1 x Per Week/15 Days Discharge Instructions: Cut Hydrofera Blue Ready, pack in undermining and apply to wound bed Secondary Dressing: Xtrasorb Medium 4x5 (in/in) 1 x Per Week/15 Days Discharge Instructions: Apply to wound as directed. Do not cut. Compression Wrap: Medichoice 4 layer Compression System, 35-40 mmHG 1 x Per Week/15 Days Discharge Instructions: Apply multi-layer wrap as directed. Electronic Signature(s) Signed: 11/18/2020 4:26:18 PM By: Baltazar Najjar MD Signed: 11/18/2020 4:33:50 PM By: Phillis Haggis, Dondra Prader RN Entered By: Phillis Haggis, Dondra Prader on 11/18/2020 11:23:03 Rodney Oneal, Rodney Oneal ([id number]) -------------------------------------------------------------------------------- Problem List Details Patient Name: Rodney Oneal, Rodney Oneal. Date of Service: 11/18/2020 10:00 AM Medical Record Number: [id number] Patient Account Number: [id number] Date of Birth/Sex: 1956/02/21 (65 y.o. 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 Date of Service: 11/18/2020 10:00 AM Medical Record Number: [id number] Patient Account Number: [id number] Date of Birth/Sex: 1955/09/27 (65 y.o. M) Treating RN: Rogers Blocker Primary Care Provider: Lenon Oms Other Clinician: Referring Provider: Lenon Oms Treating Provider/Extender: Altamese [place] in Treatment: 2 Subjective History of Present Illness (HPI) ADMISSION 11/04/2020 This is a 65 year old man who is on chronic Coumadin secondary to atrial fibrillation. He had a fall on 5/25 out of bed but he managed to hit his anterior left leg on a tool that was lying in the vicinity. He was seen at his primary doctor's office on 10/30/2020 and there was a concern for cellulitis he was put on cephalexin 500 3 times daily that he still taking. He currently has a significant swelling with some nonviable skin on top of it. He says this is begin leaking but it is not frankly open. He is not complaining of a lot of pain. Past medical history includes type 2 diabetes, chronic A. fib on Coumadin, hypertension, history of a CVA ABI in this clinic was 0.92 on the left 6/15 left medial lower leg hematoma secondary to  trauma on Coumadin. The patient arrives back in clinic with a week of 4-layer compression with calcium alginate with the area on his left medial lower leg just above the ankle looking completely the same. No evidence that this is reabsorbing. Still very tight. I do not see  any evidence of infection however I was hoping for some improvement in the amount of periwound swelling. 6/22; left medial lower leg hematoma secondary to trauma in a patient on chronic Coumadin. Last week I made the decision to open this. He had quite a bit of undermining in the deep wound. We use silver alginate. Really dramatic improvement in the undermining although the wound is still deep. Objective Constitutional Sitting or standing Blood Pressure is within target range for patient.. Pulse regular and within target range for patient.Marland Kitchen. Respirations regular, non- labored and within target range.. Temperature is normal and within the target range for the patient.Marland Kitchen. appears in no distress. Vitals Time Taken: 10:20 AM, Height: 71 in, Weight: 311 lbs, BMI: 43.4, Temperature: 98.6 F, Pulse: 97 bpm, Respiratory Rate: 18 breaths/min, Blood Pressure: 118/77 mmHg. Respiratory Respiratory effort is easy and symmetric bilaterally. Rate is normal at rest and on room air.. Cardiovascular Pedal pulses palpable and strong bilaterally.. General Notes: Wound exam; areas on the left anterior lower leg. Deep wound to however very significant improvement in the undermining there is been good tissue adherence under the compression. This was good to see. There is no exposed bone no evidence of infection bilaterally. Integumentary (Hair, Skin) Wound #1 status is Open. Original cause of wound was Skin Tear/Laceration. The date acquired was: 10/21/2020. The wound has been in treatment 2 weeks. The wound is located on the Left,Medial Lower Leg. The wound measures 3.3cm length x 1.1cm width x 2.5cm depth; 2.851cm^2 area and 7.127cm^3 volume. There  is Fat Layer (Subcutaneous Tissue) exposed. There is no tunneling noted, however, there is undermining starting at 12:00 and ending at 12:00 with a maximum distance of 1.5cm. There is a large amount of sanguinous drainage noted. There is medium (34-66%) red, pink granulation within the wound bed. There is a medium (34-66%) amount of necrotic tissue within the wound bed including Adherent Slough. Assessment Active Problems Rodney IdolRKER, Nitish R. ([id number]) ICD-10 Contusion of left lower leg, subsequent encounter Non-pressure chronic ulcer of other part of left lower leg with other specified severity Adverse effect of anticoagulants, sequela Procedures Wound #1 Pre-procedure diagnosis of Wound #1 is a Trauma, Other located on the Left,Medial Lower Leg . There was a Four Layer Compression Therapy Procedure with a pre-treatment ABI of 0.9 by Rogers BlockerSanchez, Kenia, RN. Post procedure Diagnosis Wound #1: Same as Pre-Procedure Plan Follow-up Appointments: Return Appointment in 1 week. Nurse Visit as needed Bathing/ Shower/ Hygiene: May shower with wound dressing protected with water repellent cover or cast protector. No tub bath. Edema Control - Lymphedema / Segmental Compressive Device / Other: Optional: One layer of unna paste to top of compression wrap (to act as an anchor). Medications-Please add to medication list.: Other: - Tylenol for pain WOUND #1: - Lower Leg Wound Laterality: Left, Medial Cleanser: Soap and Water 1 x Per Week/15 Days Discharge Instructions: Gently cleanse wound with antibacterial soap, rinse and pat dry prior to dressing wounds Primary Dressing: Gauze 1 x Per Week/15 Days Discharge Instructions: Dry gauze as bolster on top on hydrofera Primary Dressing: Hydrofera Blue Ready Transfer Foam, 2.5x2.5 (in/in) 1 x Per Week/15 Days Discharge Instructions: Cut Hydrofera Blue Ready, pack in undermining to wound bed as directed Secondary Dressing: Xtrasorb Medium 4x5 (in/in) 1 x Per  Week/15 Days Discharge Instructions: Apply to wound as directed. Do not cut. Compression Wrap: Medichoice 4 layer Compression System, 35-40 mmHG 1 x Per Week/15 Days Discharge Instructions: Apply multi-layer wrap as directed. 1. I change the  primary dressing to Hydrofera Blue backing wet-to-dry still under 4-layer compression. 2. We will see if the granulation begins to improve and the undermining continues to receded. 3. We have the wound VAC in reserve. May be able to use a snap VAC on this area 4. Fortunately still no evidence of Electronic Signature(s) Signed: 11/18/2020 4:26:18 PM By: Baltazar Najjar MD Entered By: Baltazar Najjar on 11/18/2020 10:59:03 Rodney Oneal ([id number]) -------------------------------------------------------------------------------- SuperBill Details Patient Name: Rodney Oneal, Rodney Oneal. Date of Service: 11/18/2020 Medical Record Number: [id number] Patient Account Number: [id number] Date of Birth/Sex: [birth year] (65 y.o. M) Treating RN: Rogers Blocker Primary Care Provider: Lenon Oms Other Clinician: Referring Provider: Lenon Oms Treating Provider/Extender: Altamese [place] in Treatment: 2 Diagnosis Coding ICD-10 Codes Code Description [phone] Contusion of left lower leg, subsequent encounter 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 specified severity T45.515S Adverse effect of anticoagulants, sequela Facility Procedures CPT4 Code: [id number] Description: (Facility Use Only) [id number] - APPLY MULTLAY COMPRS LWR LT LEG Modifier: Quantity: 1 Physician Procedures CPT4 Code: [id number] Description: 99213 - WC PHYS LEVEL 3 - EST PT Modifier: Quantity: 1 CPT4 Code: Description: ICD-10 Diagnosis Description S80.12XD Contusion of left lower leg, subsequent encounter 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

## 2020-11-19 NOTE — Progress Notes (Signed)
KETAN, RENZ ([id number]) Visit Report for 11/18/2020 Arrival Information Details Patient Name: Rodney Oneal, Rodney Oneal. Date of Service: 11/18/2020 10:00 AM Medical Record Number: [id number] Patient Account Number: [id number] Date of Birth/Sex: 11-02-1955 (65 y.o. M) Treating RN: Yevonne Pax Primary Care Janssen Zee: Lenon Oms Other Clinician: Referring Logann Whitebread: Lenon Oms Treating Shafer Swamy/Extender: Altamese [place] in Treatment: 2 Visit Information History Since Last Visit All ordered tests and consults were completed: No Patient Arrived: Ambulatory Added or deleted any medications: No Arrival Time: 10:16 Any new allergies or adverse reactions: No Accompanied By: wife Had a fall or experienced change in No Transfer Assistance: None activities of daily living that may affect Patient Identification Verified: Yes risk of falls: Secondary Verification Process Completed: Yes Signs or symptoms of abuse/neglect since last visito No Patient Requires Transmission-Based No Hospitalized since last visit: No Precautions: Implantable device outside of the clinic excluding No Patient Has Alerts: Yes cellular tissue based products placed in the center Patient Alerts: Patient on Blood since last visit: Thinner Has Dressing in Place as Prescribed: Yes DIABETIC Has Compression in Place as Prescribed: Yes COUMADIN Pain Present Now: No Electronic Signature(s) Signed: 11/18/2020 4:27:57 PM By: Yevonne Pax RN Entered By: Yevonne Pax on 11/18/2020 10:20:16 Rodney Oneal ([id number]) -------------------------------------------------------------------------------- Clinic Level of Care Assessment Details Patient Name: Rodney Oneal, Rodney Oneal. Date of Service: 11/18/2020 10:00 AM Medical Record Number: [id number] Patient Account Number: [id number] Date of Birth/Sex: 11/23/1955 (65 y.o. M) Treating RN: Rogers Blocker Primary Care Kenyette Gundy: Lenon Oms Other Clinician: Referring Markeia Harkless:  Lenon Oms Treating Aisa Schoeppner/Extender: Altamese [place] in Treatment: 2 Clinic Level of Care Assessment Items TOOL 1 Quantity Score []  - Use when EandM and Procedure is performed on INITIAL visit 0 ASSESSMENTS - Nursing Assessment / Reassessment []  - General Physical Exam (combine w/ comprehensive assessment (listed just below) when performed on new 0 pt. evals) []  - 0 Comprehensive Assessment (HX, ROS, Risk Assessments, Wounds Hx, etc.) ASSESSMENTS - Wound and Skin Assessment / Reassessment []  - Dermatologic / Skin Assessment (not related to wound area) 0 ASSESSMENTS - Ostomy and/or Continence Assessment and Care []  - Incontinence Assessment and Management 0 []  - 0 Ostomy Care Assessment and Management (repouching, etc.) PROCESS - Coordination of Care []  - Simple Patient / Family Education for ongoing care 0 []  - 0 Complex (extensive) Patient / Family Education for ongoing care []  - 0 Staff obtains , Records, Test Results / Process Orders []  - 0 Staff telephones HHA, Nursing Homes / Clarify orders / etc []  - 0 Routine Transfer to another Facility (non-emergent condition) []  - 0 Routine Hospital Admission (non-emergent condition) []  - 0 New Admissions / / Ordering NPWT, Apligraf, etc. []  - 0 Emergency Hospital Admission (emergent condition) PROCESS - Special Needs []  - Pediatric / Minor Patient Management 0 []  - 0 Isolation Patient Management []  - 0 Hearing / Language / Visual special needs []  - 0 Assessment of Community assistance (transportation, D/C planning, etc.) []  - 0 Additional assistance / Altered mentation []  - 0 Support Surface(s) Assessment (bed, cushion, seat, etc.) INTERVENTIONS - Miscellaneous []  - External ear exam 0 []  - 0 Patient Transfer (multiple staff / / Similar devices) []  - 0 Simple Staple / Suture removal (25 or less) []  - 0 Complex Staple / Suture removal (26 or more) []  -  0 Hypo/Hyperglycemic Management (do not check if billed separately) []  - 0 Ankle / Brachial Index (ABI) - do not check if billed separately Has  the patient been seen at the hospital within the last three years: Yes Total Score: 0 Level Of Care: ____ Rodney Oneal ([id number]) Electronic Signature(s) Signed: 11/18/2020 4:33:50 PM By: Phillis Haggis, Dondra Prader RN Entered By: Phillis Haggis, Dondra Prader on 11/18/2020 10:45:21 Rodney Oneal ([id number]) -------------------------------------------------------------------------------- Compression Therapy Details Patient Name: Rodney Oneal, Rodney Oneal. Date of Service: 11/18/2020 10:00 AM Medical Record Number: [id number] Patient Account Number: [id number] Date of Birth/Sex: 1956-05-03 (65 y.o. M) Treating RN: Rogers Blocker Primary Care Cordell Guercio: Lenon Oms Other Clinician: Referring Jarrett Chicoine: Lenon Oms Treating Tyjanae Bartek/Extender: Maxwell Caul Weeks in Treatment: 2 Compression Therapy Performed for Wound Assessment: Wound #1 Left,Medial Lower Leg Performed By: Clinician Rogers Blocker, RN Compression Type: Four Layer Pre Treatment ABI: 0.9 Post Procedure Diagnosis Same as Pre-procedure Electronic Signature(s) Signed: 11/18/2020 4:33:50 PM By: Phillis Haggis, Dondra Prader RN Entered By: Phillis Haggis, Dondra Prader on 11/18/2020 10:41:11 Rodney Oneal ([id number]) -------------------------------------------------------------------------------- Encounter Discharge Information Details Patient Name: Rodney Oneal, Rodney Oneal. Date of Service: 11/18/2020 10:00 AM Medical Record Number: [id number] Patient Account Number: [id number] Date of Birth/Sex: 04-20-1956 (65 y.o. M) Treating RN: Rogers Blocker Primary Care Teron Blais: Lenon Oms Other Clinician: Referring Lacrisha Bielicki: Lenon Oms Treating Eulalia Ellerman/Extender: Altamese [place] in Treatment: 2 Encounter Discharge Information Items Discharge Condition: Stable Ambulatory Status: Ambulatory Discharge  Destination: Home Transportation: Private Auto Accompanied By: wife Schedule Follow-up Appointment: Yes Clinical Summary of Care: Electronic Signature(s) Signed: 11/19/2020 4:21:06 PM By: Lolita Cram Entered By: Lolita Cram on 11/18/2020 11:12:05 Rodney Oneal ([id number]) -------------------------------------------------------------------------------- Lower Extremity Assessment Details Patient Name: Rodney Oneal, Rodney Oneal. Date of Service: 11/18/2020 10:00 AM Medical Record Number: [id number] Patient Account Number: [id number] Date of Birth/Sex: [birth year] (65 y.o. M) Treating RN: Yevonne Pax Primary Care Aubria Vanecek: Lenon Oms Other Clinician: Referring Detavious Rinn: Lenon Oms Treating Chrisy Hillebrand/Extender: Altamese [place] in Treatment: 2 Edema Assessment Assessed: [Left: No] [Right: No] Edema: [Left: Ye] [Right: s] Calf Left: Right: Point of Measurement: 34 cm From Medial Instep 37 cm Ankle Left: Right: Point of Measurement: 10 cm From Medial Instep 23 cm Vascular Assessment Pulses: Dorsalis Pedis Palpable: [Left:Yes] Electronic Signature(s) Signed: 11/18/2020 4:27:57 PM By: Yevonne Pax RN Entered By: Yevonne Pax on 11/18/2020 10:34:10 Rodney Oneal ([id number]) -------------------------------------------------------------------------------- Multi Wound Chart Details Patient Name: Rodney Oneal. Date of Service: 11/18/2020 10:00 AM Medical Record Number: [id number] Patient Account Number: [id number] Date of Birth/Sex: [birth year] (65 y.o. M) Treating RN: Rogers Blocker Primary Care Kato Wieczorek: Lenon Oms Other Clinician: Referring Ronya Gilcrest: Lenon Oms Treating Inga Noller/Extender: Altamese [place] in Treatment: 2 Vital Signs Height(in): 71 Pulse(bpm): 97 Weight(lbs): 311 Blood Pressure(mmHg): 118/77 Body Mass Index(BMI): 43 Temperature(F): 98.6 Respiratory Rate(breaths/min): 18 Photos: [N/A:N/A] Wound Location: Left, Medial Lower Leg N/A  N/A Wounding Event: Skin Tear/Laceration N/A N/A Primary Etiology: Trauma, Other N/A N/A Comorbid History: Cataracts, Sleep Apnea, N/A N/A Arrhythmia, Coronary Artery Disease, Hypertension, Type II Diabetes, Osteoarthritis Date Acquired: 10/21/2020 N/A N/A Weeks of Treatment: 2 N/A N/A Wound Status: Open N/A N/A Measurements L x W x D (cm) 3.3x1.1x2.5 N/A N/A Area (cm) : 2.851 N/A N/A Volume (cm) : 7.127 N/A N/A % Reduction in Area: 3.20% N/A N/A % Reduction in Volume: -2315.90% N/A N/A Starting Position 1 (o'clock): 12 Ending Position 1 (o'clock): 12 Maximum Distance 1 (cm): 1.5 Undermining: Yes N/A N/A Classification: Full Thickness Without Exposed N/A N/A Support Structures Exudate Amount: Large N/A N/A Exudate Type: Sanguinous N/A N/A Exudate Color: red N/A N/A Granulation Amount: Medium (34-66%) N/A N/A Granulation Quality: Red, Pink N/A N/A  Necrotic Amount: Medium (34-66%) N/A N/A Exposed Structures: Fat Layer (Subcutaneous Tissue): N/A N/A Yes Fascia: No Tendon: No Muscle: No Joint: No Bone: No Epithelialization: None N/A N/A Procedures Performed: Compression Therapy N/A N/A Treatment Notes Electronic Signature(s) Rodney Oneal, Rodney Oneal. ([id number]) Signed: 11/18/2020 4:26:18 PM By: Baltazar Najjarobson, Michael MD Entered By: Baltazar Najjarobson, Michael on 11/18/2020 10:56:02 Rodney Oneal, Rodney Oneal. ([id number]) -------------------------------------------------------------------------------- Multi-Disciplinary Care Plan Details Patient Name: Rodney Oneal, Rodney Oneal. Date of Service: 11/18/2020 10:00 AM Medical Record Number: [id number] Patient Account Number: [id number] Date of Birth/Sex: 08/13/1955 (65 y.o. M) Treating RN: Rogers BlockerSanchez, Kenia Primary Care Seger Jani: Lenon OmsGauger, Sarah Other Clinician: Referring Aaren Krog: Lenon OmsGauger, Sarah Treating Mikesha Migliaccio/Extender: Altamese CarolinaOBSON, MICHAEL G Weeks in Treatment: 2 Active Inactive Necrotic Tissue Nursing Diagnoses: Impaired tissue integrity related to necrotic/devitalized  tissue Goals: Necrotic/devitalized tissue will be minimized in the wound bed Date Initiated: 11/04/2020 Target Resolution Date: 11/04/2020 Goal Status: Active Patient/caregiver will verbalize understanding of reason and process for debridement of necrotic tissue Date Initiated: 11/04/2020 Target Resolution Date: 11/04/2020 Goal Status: Active Interventions: Assess patient pain level pre-, during and post procedure and prior to discharge Provide education on necrotic tissue and debridement process Treatment Activities: Apply topical anesthetic as ordered : 11/04/2020 Enzymatic debridement : 11/04/2020 Excisional debridement : 11/04/2020 Notes: Wound/Skin Impairment Nursing Diagnoses: Impaired tissue integrity Goals: Patient/caregiver will verbalize understanding of skin care regimen Date Initiated: 11/04/2020 Target Resolution Date: 11/04/2020 Goal Status: Active Ulcer/skin breakdown will have a volume reduction of 30% by week 4 Date Initiated: 11/04/2020 Target Resolution Date: 12/04/2020 Goal Status: Active Ulcer/skin breakdown will have a volume reduction of 50% by week 8 Date Initiated: 11/04/2020 Target Resolution Date: 01/04/2021 Goal Status: Active Ulcer/skin breakdown will have a volume reduction of 80% by week 12 Date Initiated: 11/04/2020 Target Resolution Date: 02/04/2021 Goal Status: Active Ulcer/skin breakdown will heal within 14 weeks Date Initiated: 11/04/2020 Target Resolution Date: 03/06/2021 Goal Status: Active Interventions: Assess patient/caregiver ability to obtain necessary supplies Assess patient/caregiver ability to perform ulcer/skin care regimen upon admission and as needed Assess ulceration(s) every visit Provide education on ulcer and skin care Treatment Activities: Rodney Oneal, Darrly Oneal. ([id number]) Referred to DME Milburn Freeney for dressing supplies : 11/04/2020 Skin care regimen initiated : 11/04/2020 Notes: Electronic Signature(s) Signed: 11/18/2020 4:33:50 PM By: Phillis HaggisSanchez Pereyda,  Dondra PraderKenia RN Entered By: Phillis HaggisSanchez Pereyda, Dondra PraderKenia on 11/18/2020 10:39:49 Rodney Oneal, Romar Oneal. ([id number]) -------------------------------------------------------------------------------- Pain Assessment Details Patient Name: Rodney Oneal, Rodney Oneal. Date of Service: 11/18/2020 10:00 AM Medical Record Number: [id number] Patient Account Number: [id number] Date of Birth/Sex: 08/12/1955 (65 y.o. M) Treating RN: Yevonne PaxEpps, Carrie Primary Care Curtez Brallier: Lenon OmsGauger, Sarah Other Clinician: Referring Annise Boran: Lenon OmsGauger, Sarah Treating Anslee Micheletti/Extender: Altamese CarolinaOBSON, MICHAEL G Weeks in Treatment: 2 Active Problems Location of Pain Severity and Description of Pain Patient Has Paino No Site Locations Pain Management and Medication Current Pain Management: Electronic Signature(s) Signed: 11/18/2020 4:27:57 PM By: Yevonne PaxEpps, Carrie RN Entered By: Yevonne PaxEpps, Carrie on 11/18/2020 10:20:35 Rodney Oneal, Danie Oneal. ([id number]) -------------------------------------------------------------------------------- Patient/Caregiver Education Details Patient Name: Rodney Oneal, Rodney Oneal. Date of Service: 11/18/2020 10:00 AM Medical Record Number: [id number] Patient Account Number: [id number] Date of Birth/Gender: 05/02/1956 (65 y.o. M) Treating RN: Rogers BlockerSanchez, Kenia Primary Care Physician: Lenon OmsGauger, Sarah Other Clinician: Referring Physician: Lenon OmsGauger, Sarah Treating Physician/Extender: Altamese CarolinaOBSON, MICHAEL G Weeks in Treatment: 2 Education Assessment Education Provided To: Patient Education Topics Provided Wound/Skin Impairment: Methods: Explain/Verbal Responses: State content correctly Electronic Signature(s) Signed: 11/18/2020 4:33:50 PM By: Phillis HaggisSanchez Pereyda, Dondra PraderKenia RN Entered By: Phillis HaggisSanchez Pereyda, Dondra PraderKenia on 11/18/2020 10:45:36 Rodney Oneal, Massimiliano Oneal. ([id number]) -------------------------------------------------------------------------------- Wound Assessment Details Patient Name: Jimmey RalphPARKER,  Karmine Oneal. Date of Service: 11/18/2020 10:00 AM Medical Record Number: [id number] Patient  Account Number: [id number] Date of Birth/Sex: 1955-11-27 (65 y.o. M) Treating RN: Yevonne Pax Primary Care Alveria Mcglaughlin: Lenon Oms Other Clinician: Referring Massiel Stipp: Lenon Oms Treating Mazin Emma/Extender: Altamese [place] in Treatment: 2 Wound Status Wound Number: 1 Primary Trauma, Other Etiology: Wound Location: Left, Medial Lower Leg Wound Open Wounding Event: Skin Tear/Laceration Status: Date Acquired: 10/21/2020 Comorbid Cataracts, Sleep Apnea, Arrhythmia, Coronary Artery Weeks Of Treatment: 2 History: Disease, Hypertension, Type II Diabetes, Osteoarthritis Clustered Wound: No Photos Wound Measurements Length: (cm) 3.3 Width: (cm) 1.1 Depth: (cm) 2.5 Area: (cm) 2.851 Volume: (cm) 7.127 % Reduction in Area: 3.2% % Reduction in Volume: -2315.9% Epithelialization: None Tunneling: No Undermining: Yes Starting Position (o'clock): 12 Ending Position (o'clock): 12 Maximum Distance: (cm) 1.5 Wound Description Classification: Full Thickness Without Exposed Support Structures Exudate Amount: Large Exudate Type: Sanguinous Exudate Color: red Foul Odor After Cleansing: No Slough/Fibrino No Wound Bed Granulation Amount: Medium (34-66%) Exposed Structure Granulation Quality: Red, Pink Fascia Exposed: No Necrotic Amount: Medium (34-66%) Fat Layer (Subcutaneous Tissue) Exposed: Yes Necrotic Quality: Adherent Slough Tendon Exposed: No Muscle Exposed: No Joint Exposed: No Bone Exposed: No Treatment Notes Wound #1 (Lower Leg) Wound Laterality: Left, Medial Cleanser Soap and Water ANDRIC, KERCE ([id number]) Discharge Instruction: Gently cleanse wound with antibacterial soap, rinse and pat dry prior to dressing wounds Peri-Wound Care Topical Primary Dressing Gauze Discharge Instruction: Dry gauze as bolster on top on hydrofera Hydrofera Blue Ready Transfer Foam, 2.5x2.5 (in/in) Discharge Instruction: Cut Hydrofera Blue Ready, pack in undermining to wound  bed as directed Secondary Dressing Xtrasorb Medium 4x5 (in/in) Discharge Instruction: Apply to wound as directed. Do not cut. Secured With Compression Wrap Medichoice 4 layer Compression System, 35-40 mmHG Discharge Instruction: Apply multi-layer wrap as directed. Compression Stockings Add-Ons Electronic Signature(s) Signed: 11/18/2020 4:27:57 PM By: Yevonne Pax RN Entered By: Yevonne Pax on 11/18/2020 10:33:38 FERMIN, YAN ([id number]) -------------------------------------------------------------------------------- Vitals Details Patient Name: Rodney Oneal. Date of Service: 11/18/2020 10:00 AM Medical Record Number: [id number] Patient Account Number: [id number] Date of Birth/Sex: 05-20-1956 (65 y.o. M) Treating RN: Yevonne Pax Primary Care Orazio Weller: Lenon Oms Other Clinician: Referring Merril Isakson: Lenon Oms Treating Linde Wilensky/Extender: Altamese [place] in Treatment: 2 Vital Signs Time Taken: 10:20 Temperature (F): 98.6 Height (in): 71 Pulse (bpm): 97 Weight (lbs): 311 Respiratory Rate (breaths/min): 18 Body Mass Index (BMI): 43.4 Blood Pressure (mmHg): 118/77 Reference Range: 80 - 120 mg / dl Electronic Signature(s) Signed: 11/18/2020 4:27:57 PM By: Yevonne Pax RN Entered By: Yevonne Pax on 11/18/2020 10:20:30

## 2020-11-25 ENCOUNTER — Encounter (HOSPITAL_BASED_OUTPATIENT_CLINIC_OR_DEPARTMENT_OTHER): Payer: Medicare Other | Admitting: Internal Medicine

## 2020-11-25 ENCOUNTER — Other Ambulatory Visit: Payer: Self-pay

## 2020-11-25 DIAGNOSIS — L97828 Non-pressure chronic ulcer of other part of left lower leg with other specified severity: Secondary | ICD-10-CM | POA: Diagnosis not present

## 2020-11-25 DIAGNOSIS — E11622 Type 2 diabetes mellitus with other skin ulcer: Secondary | ICD-10-CM | POA: Diagnosis not present

## 2020-11-25 NOTE — Progress Notes (Signed)
SHERLEY, LESER ([id number]) Visit Report for 11/25/2020 Chief Complaint Document Details Patient Name: Rodney Oneal, Rodney Oneal. Date of Service: 11/25/2020 10:00 AM Medical Record Number: [id number] Patient Account Number: [id number] Date of Birth/Sex: Jun 13, 1955 (65 y.o. M) Treating RN: Rogers Blocker Primary Care Provider: Lenon Oms Other Clinician: Referring Provider: Lenon Oms Treating Provider/Extender: Tilda Franco in Treatment: 3 Information Obtained from: Patient Chief Complaint 11/04/2020; patient is here for review of a hematoma on his left lower leg Electronic Signature(s) Signed: 11/25/2020 10:49:14 AM By: Geralyn Corwin DO Entered By: Geralyn Corwin on 11/25/2020 10:44:01 Rodney Oneal ([id number]) -------------------------------------------------------------------------------- Debridement Details Patient Name: Rodney Oneal. Date of Service: 11/25/2020 10:00 AM Medical Record Number: [id number] Patient Account Number: [id number] Date of Birth/Sex: May 31, 1955 (65 y.o. M) Treating RN: Rogers Blocker Primary Care Provider: Lenon Oms Other Clinician: Referring Provider: Lenon Oms Treating Provider/Extender: Tilda Franco in Treatment: 3 Debridement Performed for Wound #1 Left,Medial Lower Leg Assessment: Performed By: Physician Geralyn Corwin, MD Debridement Type: Debridement Level of Consciousness (Pre- Awake and Alert procedure): Pre-procedure Verification/Time Out Yes - 10:35 Taken: Start Time: 10:35 Total Area Debrided (L x W): 2.9 (cm) x 1.5 (cm) = 4.35 (cm) Tissue and other material Viable, Non-Viable, Slough, Subcutaneous, Slough debrided: Level: Skin/Subcutaneous Tissue Debridement Description: Excisional Instrument: Curette Bleeding: Moderate Hemostasis Achieved: Gel Foam Response to Treatment: Procedure was tolerated well Level of Consciousness (Post- Awake and Alert procedure): Post Debridement Measurements of  Total Wound Length: (cm) 2.9 Width: (cm) 1.5 Depth: (cm) 1.6 Volume: (cm) 5.466 Character of Wound/Ulcer Post Debridement: Stable Post Procedure Diagnosis Same as Pre-procedure Notes 1 silver nitrate stick used by MD Electronic Signature(s) Signed: 11/25/2020 10:49:14 AM By: Geralyn Corwin DO Signed: 11/25/2020 12:27:32 PM By: Phillis Haggis, Dondra Prader RN Entered By: Phillis Haggis, Dondra Prader on 11/25/2020 10:40:43 Rodney Oneal ([id number]) -------------------------------------------------------------------------------- HPI Details Patient Name: Rodney Oneal. Date of Service: 11/25/2020 10:00 AM Medical Record Number: [id number] Patient Account Number: [id number] Date of Birth/Sex: 11-12-1955 (65 y.o. M) Treating RN: Rogers Blocker Primary Care Provider: Lenon Oms Other Clinician: Referring Provider: Lenon Oms Treating Provider/Extender: Tilda Franco in Treatment: 3 History of Present Illness HPI Description: ADMISSION 11/04/2020 This is a 65 year old man who is on chronic Coumadin secondary to atrial fibrillation. He had a fall on 5/25 out of bed but he managed to hit his anterior left leg on a tool that was lying in the vicinity. He was seen at his primary doctor's office on 10/30/2020 and there was a concern for cellulitis he was put on cephalexin 500 3 times daily that he still taking. He currently has a significant swelling with some nonviable skin on top of it. He says this is begin leaking but it is not frankly open. He is not complaining of a lot of pain. Past medical history includes type 2 diabetes, chronic A. fib on Coumadin, hypertension, history of a CVA ABI in this clinic was 0.92 on the left 6/15 left medial lower leg hematoma secondary to trauma on Coumadin. The patient arrives back in clinic with a week of 4-layer compression with calcium alginate with the area on his left medial lower leg just above the ankle looking completely the same. No evidence  that this is reabsorbing. Still very tight. I do not see any evidence of infection however I was hoping for some improvement in the amount of periwound swelling. 6/22; left medial lower leg hematoma secondary to trauma in a patient on chronic Coumadin. Last week I made the decision  to open this. He had quite a bit of undermining in the deep wound. We use silver alginate. Really dramatic improvement in the undermining although the wound is still deep. 6/29; patient presents for 1 week follow-up. He has been using Hydrofera Blue under 4-layer compression and tolerating this well. He denies signs of infection. Electronic Signature(s) Signed: 11/25/2020 10:49:14 AM By: Geralyn Corwin DO Entered By: Geralyn Corwin on 11/25/2020 10:44:34 Rodney Oneal ([id number]) -------------------------------------------------------------------------------- Physical Exam Details Patient Name: Rodney Oneal, Rodney Oneal. Date of Service: 11/25/2020 10:00 AM Medical Record Number: [id number] Patient Account Number: [id number] Date of Birth/Sex: 04-20-1956 (65 y.o. M) Treating RN: Rogers Blocker Primary Care Provider: Lenon Oms Other Clinician: Referring Provider: Lenon Oms Treating Provider/Extender: Tilda Franco in Treatment: 3 Constitutional . Cardiovascular . Psychiatric . Notes Left lower extremity: On the anterior aspect there is a large deep wound with granulation tissue and nonviable tissue present. There is no exposed bone. No evidence of infection. Electronic Signature(s) Signed: 11/25/2020 10:49:14 AM By: Geralyn Corwin DO Entered By: Geralyn Corwin on 11/25/2020 10:45:46 Rodney Oneal ([id number]) -------------------------------------------------------------------------------- Physician Orders Details Patient Name: Rodney Oneal, Rodney Oneal. Date of Service: 11/25/2020 10:00 AM Medical Record Number: [id number] Patient Account Number: [id number] Date of Birth/Sex: 09/27/1955 (65 y.o.  M) Treating RN: Rogers Blocker Primary Care Provider: Lenon Oms Other Clinician: Referring Provider: Lenon Oms Treating Provider/Extender: Tilda Franco in Treatment: 3 Verbal / Phone Orders: No Diagnosis Coding Follow-up Appointments o Return Appointment in 1 week. o Nurse Visit as needed Bathing/ Shower/ Hygiene o May shower with wound dressing protected with water repellent cover or cast protector. o No tub bath. Edema Control - Lymphedema / Segmental Compressive Device / Other o Optional: One layer of unna paste to top of compression wrap (to act as an anchor). Medications-Please add to medication list. o Other: - Tylenol for pain Wound Treatment Wound #1 - Lower Leg Wound Laterality: Left, Medial Cleanser: Soap and Water 1 x Per Week/15 Days Discharge Instructions: Gently cleanse wound with antibacterial soap, rinse and pat dry prior to dressing wounds Topical: Santyl Collagenase Ointment, 30 (gm), tube 1 x Per Week/15 Days Discharge Instructions: Apply nickel thick to wound bed only Primary Dressing: Gauze 1 x Per Week/15 Days Discharge Instructions: Dry gauze as bolster on top on hydrofera Primary Dressing: Hydrofera Blue Ready Transfer Foam, 2.5x2.5 (in/in) 1 x Per Week/15 Days Discharge Instructions: Cut Hydrofera Blue Ready, pack in undermining and apply to wound bed Secondary Dressing: Xtrasorb Medium 4x5 (in/in) 1 x Per Week/15 Days Discharge Instructions: Apply to wound as directed. Do not cut. Compression Wrap: Medichoice 4 layer Compression System, 35-40 mmHG 1 x Per Week/15 Days Discharge Instructions: Apply multi-layer wrap as directed. Electronic Signature(s) Signed: 11/25/2020 10:49:14 AM By: Geralyn Corwin DO Signed: 11/25/2020 12:27:32 PM By: Phillis Haggis, Dondra Prader RN Entered By: Phillis Haggis, Dondra Prader on 11/25/2020 10:38:21 Rodney Oneal, Rodney Oneal  ([id number]) -------------------------------------------------------------------------------- Problem List Details Patient Name: Rodney Oneal, Rodney Oneal. Date of Service: 11/25/2020 10:00 AM Medical Record Number: [id number] Patient Account Number: [id number] Date of Birth/Sex: 1955/08/09 (65 y.o. M) Treating RN: Rogers Blocker Primary Care Provider: Lenon Oms Other Clinician: Referring Provider: Lenon Oms Treating Provider/Extender: Tilda Franco in Treatment: 3 Active Problems ICD-10 Encounter Code Description Active Date MDM Diagnosis S80.12XD Contusion of left lower leg, subsequent encounter 11/04/2020 No Yes 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 11/04/2020 No Yes specified severity T45.515S Adverse effect of anticoagulants, sequela 11/04/2020 No Yes Inactive Problems Resolved  Problems Electronic Signature(s) Signed: 11/25/2020 10:49:14 AM By: Geralyn Corwin DO Entered By: Geralyn Corwin on 11/25/2020 10:43:05 Rodney Oneal ([id number]) -------------------------------------------------------------------------------- Progress Note Details Patient Name: Rodney Oneal, Rodney Oneal. Date of Service: 11/25/2020 10:00 AM Medical Record Number: [id number] Patient Account Number: [id number] Date of Birth/Sex: 1955/06/07 (65 y.o. M) Treating RN: Rogers Blocker Primary Care Provider: Lenon Oms Other Clinician: Referring Provider: Lenon Oms Treating Provider/Extender: Tilda Franco in Treatment: 3 Subjective Chief Complaint Information obtained from Patient 11/04/2020; patient is here for review of a hematoma on his left lower leg History of Present Illness (HPI) ADMISSION 11/04/2020 This is a 65 year old man who is on chronic Coumadin secondary to atrial fibrillation. He had a fall on 5/25 out of bed but he managed to hit his anterior left leg on a tool that was lying in the vicinity. He was seen at his primary doctor's office on  10/30/2020 and there was a concern for cellulitis he was put on cephalexin 500 3 times daily that he still taking. He currently has a significant swelling with some nonviable skin on top of it. He says this is begin leaking but it is not frankly open. He is not complaining of a lot of pain. Past medical history includes type 2 diabetes, chronic A. fib on Coumadin, hypertension, history of a CVA ABI in this clinic was 0.92 on the left 6/15 left medial lower leg hematoma secondary to trauma on Coumadin. The patient arrives back in clinic with a week of 4-layer compression with calcium alginate with the area on his left medial lower leg just above the ankle looking completely the same. No evidence that this is reabsorbing. Still very tight. I do not see any evidence of infection however I was hoping for some improvement in the amount of periwound swelling. 6/22; left medial lower leg hematoma secondary to trauma in a patient on chronic Coumadin. Last week I made the decision to open this. He had quite a bit of undermining in the deep wound. We use silver alginate. Really dramatic improvement in the undermining although the wound is still deep. 6/29; patient presents for 1 week follow-up. He has been using Hydrofera Blue under 4-layer compression and tolerating this well. He denies signs of infection. Patient History Information obtained from Patient. Social History Former smoker - ended on 05/30/2013, Marital Status - Married, Alcohol Use - Never, Drug Use - No History, Caffeine Use - Rarely. Medical History Eyes Patient has history of Cataracts - surgery Respiratory Patient has history of Sleep Apnea Cardiovascular Patient has history of Arrhythmia - afib, Coronary Artery Disease, Hypertension Endocrine Patient has history of Type II Diabetes Musculoskeletal Patient has history of Osteoarthritis Objective Constitutional Vitals Time Taken: 9:55 AM, Height: 71 in, Weight: 311 lbs, BMI: 43.4,  Temperature: 99.2 F, Pulse: 108 bpm, Respiratory Rate: 20 breaths/min, Blood Pressure: 149/86 mmHg. Rodney Oneal, Rodney Oneal ([id number]) General Notes: Left lower extremity: On the anterior aspect there is a large deep wound with granulation tissue and nonviable tissue present. There is no exposed bone. No evidence of infection. Integumentary (Hair, Skin) Wound #1 status is Open. Original cause of wound was Skin Tear/Laceration. The date acquired was: 10/21/2020. The wound has been in treatment 3 weeks. The wound is located on the Left,Medial Lower Leg. The wound measures 2.9cm length x 1.5cm width x 1.5cm depth; 3.416cm^2 area and 5.125cm^3 volume. There is Fat Layer (Subcutaneous Tissue) exposed. There is no tunneling noted, however, there is undermining starting at 12:00 and ending at 6:00  with a maximum distance of 1cm. There is a large amount of sanguinous drainage noted. There is large (67-100%) red, pink granulation within the wound bed. There is a small (1-33%) amount of necrotic tissue within the wound bed including Adherent Slough. Assessment Active Problems ICD-10 Contusion of left lower leg, subsequent encounter Non-pressure chronic ulcer of other part of left lower leg with other specified severity Adverse effect of anticoagulants, sequela Patient's wound has shown improvement in size and appearance since last clinic visit. Nonviable tissue was debrided in office. No signs of infection. We will continue Hydrofera Blue under 4-layer compression. We will follow-up in 1 week Procedures Wound #1 Pre-procedure diagnosis of Wound #1 is a Trauma, Other located on the Left,Medial Lower Leg . There was a Excisional Skin/Subcutaneous Tissue Debridement with a total area of 4.35 sq cm performed by Geralyn CorwinHoffman, Kami Kube, MD. With the following instrument(s): Curette to remove Viable and Non-Viable tissue/material. Material removed includes Subcutaneous Tissue and Slough and. A time out was conducted at  10:35, prior to the start of the procedure. A Moderate amount of bleeding was controlled with Gel Foam. The procedure was tolerated well. Post Debridement Measurements: 2.9cm length x 1.5cm width x 1.6cm depth; 5.466cm^3 volume. Character of Wound/Ulcer Post Debridement is stable. Post procedure Diagnosis Wound #1: Same as Pre-Procedure General Notes: 1 silver nitrate stick used by MD. Pre-procedure diagnosis of Wound #1 is a Trauma, Other located on the Left,Medial Lower Leg . There was a Four Layer Compression Therapy Procedure with a pre-treatment ABI of 0.9 by Rogers BlockerSanchez, Kenia, RN. Post procedure Diagnosis Wound #1: Same as Pre-Procedure Plan Follow-up Appointments: Return Appointment in 1 week. Nurse Visit as needed Bathing/ Shower/ Hygiene: May shower with wound dressing protected with water repellent cover or cast protector. No tub bath. Edema Control - Lymphedema / Segmental Compressive Device / Other: Optional: One layer of unna paste to top of compression wrap (to act as an anchor). Medications-Please add to medication list.: Other: - Tylenol for pain WOUND #1: - Lower Leg Wound Laterality: Left, Medial Cleanser: Soap and Water 1 x Per Week/15 Days Discharge Instructions: Gently cleanse wound with antibacterial soap, rinse and pat dry prior to dressing wounds Topical: Santyl Collagenase Ointment, 30 (gm), tube 1 x Per Week/15 Days Discharge Instructions: Apply nickel thick to wound bed only Primary Dressing: Gauze 1 x Per Week/15 Days Discharge Instructions: Dry gauze as bolster on top on hydrofera Primary Dressing: Hydrofera Blue Ready Transfer Foam, 2.5x2.5 (in/in) 1 x Per Week/15 Days Discharge Instructions: Cut Hydrofera Blue Ready, pack in undermining and apply to wound bed Secondary Dressing: Xtrasorb Medium 4x5 (in/in) 1 x Per Week/15 Days Rodney IdolRKER, Rodney R. ([id number]) Discharge Instructions: Apply to wound as directed. Do not cut. Compression Wrap: Medichoice 4 layer  Compression System, 35-40 mmHG 1 x Per Week/15 Days Discharge Instructions: Apply multi-layer wrap as directed. 1. In office sharp debridement 2. Hydrofera Blue with Santyl under 4-layer compression 3. Follow-up in 1 week Electronic Signature(s) Signed: 11/25/2020 10:49:14 AM By: Geralyn CorwinHoffman, Quincie Haroon DO Entered By: Geralyn CorwinHoffman, Mollee Neer on 11/25/2020 10:47:52 Rodney IdolPARKER, Keita R. ([id number]) -------------------------------------------------------------------------------- ROS/PFSH Details Patient Name: Rodney IdolPARKER, Rodney R. Date of Service: 11/25/2020 10:00 AM Medical Record Number: [id number] Patient Account Number: [id number] Date of Birth/Sex: 11/16/1955 (65 y.o. M) Treating RN: Rogers BlockerSanchez, Kenia Primary Care Provider: Lenon OmsGauger, Sarah Other Clinician: Referring Provider: Lenon OmsGauger, Sarah Treating Provider/Extender: Tilda FrancoHoffman, Deondrea Aguado Weeks in Treatment: 3 Information Obtained From Patient Eyes Medical History: Positive for: Cataracts - surgery Respiratory Medical History: Positive for: Sleep  Apnea Cardiovascular Medical History: Positive for: Arrhythmia - afib; Coronary Artery Disease; Hypertension Endocrine Medical History: Positive for: Type II Diabetes Time with diabetes: 2002 Treated with: Insulin, Oral agents Blood sugar tested every day: No Musculoskeletal Medical History: Positive for: Osteoarthritis HBO Extended History Items Eyes: Cataracts Immunizations Pneumococcal Vaccine: Received Pneumococcal Vaccination: Yes Implantable Devices None Family and Social History Former smoker - ended on 05/30/2013; Marital Status - Married; Alcohol Use: Never; Drug Use: No History; Caffeine Use: Rarely; Financial Concerns: No; Food, Clothing or Shelter Needs: No; Support System Lacking: No; Transportation Concerns: No Electronic Signature(s) Signed: 11/25/2020 10:49:14 AM By: Geralyn Corwin DO Signed: 11/25/2020 12:27:32 PM By: Phillis Haggis, Dondra Prader RN Entered By: Geralyn Corwin on 11/25/2020  10:44:41 Rodney Oneal ([id number]) -------------------------------------------------------------------------------- SuperBill Details Patient Name: Rodney Oneal, Rodney Oneal. Date of Service: 11/25/2020 Medical Record Number: [id number] Patient Account Number: [id number] Date of Birth/Sex: 08-16-1955 (65 y.o. M) Treating RN: Rogers Blocker Primary Care Provider: Lenon Oms Other Clinician: Referring Provider: Lenon Oms Treating Provider/Extender: Tilda Franco in Treatment: 3 Diagnosis Coding ICD-10 Codes Code Description [phone] Contusion of left lower leg, subsequent encounter 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 specified severity T45.515S Adverse effect of anticoagulants, sequela Facility Procedures CPT4 Code: [id number] Description: 11042 - DEB SUBQ TISSUE 20 SQ CM/< Modifier: Quantity: 1 CPT4 Code: Description: ICD-10 Diagnosis Description 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 spec Modifier: ified severity Quantity: Physician Procedures CPT4 Code: [id number] Description: 11042 - WC PHYS SUBQ TISS 20 SQ CM Modifier: Quantity: 1 CPT4 Code: Description: ICD-10 Diagnosis Description 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 spec Modifier: ified severity Quantity: Electronic Signature(s) Signed: 11/25/2020 10:49:14 AM By: Geralyn Corwin DO Entered By: Geralyn Corwin on 11/25/2020 10:48:53

## 2020-11-25 NOTE — Progress Notes (Signed)
Rodney Oneal ([id number]) Visit Report for 11/25/2020 Arrival Information Details Patient Name: Rodney Oneal, Rodney Oneal. Date of Service: 11/25/2020 10:00 AM Medical Record Number: [id number] Patient Account Number: [id number] Date of Birth/Sex: [birth year] (65 y.o. M) Treating RN: Hansel Feinstein Primary Care Tenee Wish: Lenon Oms Other Clinician: Referring Junie Engram: Lenon Oms Treating Ireland Chagnon/Extender: Tilda Franco in Treatment: 3 Visit Information History Since Last Visit Added or deleted any medications: No Patient Arrived: Ambulatory Had a fall or experienced change in No Arrival Time: 09:53 activities of daily living that may affect Accompanied By: wife risk of falls: Transfer Assistance: None Hospitalized since last visit: No Patient Requires Transmission-Based No Has Dressing in Place as Prescribed: Yes Precautions: Has Compression in Place as Prescribed: Yes Patient Has Alerts: Yes Pain Present Now: Yes Patient Alerts: Patient on Blood Thinner DIABETIC COUMADIN Electronic Signature(s) Signed: 11/25/2020 3:28:20 PM By: Hansel Feinstein Entered By: Hansel Feinstein on 11/25/2020 09:54:16 Rodney Oneal ([id number]) -------------------------------------------------------------------------------- Clinic Level of Care Assessment Details Patient Name: Rodney Oneal. Date of Service: 11/25/2020 10:00 AM Medical Record Number: [id number] Patient Account Number: [id number] Date of Birth/Sex: 09-25-1955 (65 y.o. M) Treating RN: Rogers Blocker Primary Care Ahley Bulls: Lenon Oms Other Clinician: Referring Lessie Funderburke: Lenon Oms Treating Fredis Malkiewicz/Extender: Tilda Franco in Treatment: 3 Clinic Level of Care Assessment Items TOOL 1 Quantity Score []  - Use when EandM and Procedure is performed on INITIAL visit 0 ASSESSMENTS - Nursing Assessment / Reassessment []  - General Physical Exam (combine w/ comprehensive assessment (listed just below) when performed on new 0 pt.  evals) []  - 0 Comprehensive Assessment (HX, ROS, Risk Assessments, Wounds Hx, etc.) ASSESSMENTS - Wound and Skin Assessment / Reassessment []  - Dermatologic / Skin Assessment (not related to wound area) 0 ASSESSMENTS - Ostomy and/or Continence Assessment and Care []  - Incontinence Assessment and Management 0 []  - 0 Ostomy Care Assessment and Management (repouching, etc.) PROCESS - Coordination of Care []  - Simple Patient / Family Education for ongoing care 0 []  - 0 Complex (extensive) Patient / Family Education for ongoing care []  - 0 Staff obtains , Records, Test Results / Process Orders []  - 0 Staff telephones HHA, Nursing Homes / Clarify orders / etc []  - 0 Routine Transfer to another Facility (non-emergent condition) []  - 0 Routine Hospital Admission (non-emergent condition) []  - 0 New Admissions / / Ordering NPWT, Apligraf, etc. []  - 0 Emergency Hospital Admission (emergent condition) PROCESS - Special Needs []  - Pediatric / Minor Patient Management 0 []  - 0 Isolation Patient Management []  - 0 Hearing / Language / Visual special needs []  - 0 Assessment of Community assistance (transportation, D/C planning, etc.) []  - 0 Additional assistance / Altered mentation []  - 0 Support Surface(s) Assessment (bed, cushion, seat, etc.) INTERVENTIONS - Miscellaneous []  - External ear exam 0 []  - 0 Patient Transfer (multiple staff / / Similar devices) []  - 0 Simple Staple / Suture removal (25 or less) []  - 0 Complex Staple / Suture removal (26 or more) []  - 0 Hypo/Hyperglycemic Management (do not check if billed separately) []  - 0 Ankle / Brachial Index (ABI) - do not check if billed separately Has the patient been seen at the hospital within the last three years: Yes Total Score: 0 Level Of Care: ____ ( ) Electronic Signature(s) Signed: 11/25/2020 12:27:32 PM By: , RN Entered By:  , Chiropractor on 11/25/2020 10:38:26 ( ) -------------------------------------------------------------------------------- Compression Therapy Details Patient Name:  R. Date of Service: 11/25/2020 10:00 AM Medical Record Number: [id number] Patient Account Number: [id number] Date of Birth/Sex: [birth year] (65 y.o. M) Treating RN: Rogers Blocker Primary Care Dennys Traughber: Lenon Oms Other Clinician: Referring Loura Pitt: Lenon Oms Treating Canesha Tesfaye/Extender: Tilda Franco in Treatment: 3 Compression Therapy Performed for Wound Assessment: Wound #1 Left,Medial Lower Leg Performed By: Clinician Rogers Blocker, RN Compression Type: Four Layer Pre Treatment ABI: 0.9 Post Procedure Diagnosis Same as Pre-procedure Electronic Signature(s) Signed: 11/25/2020 12:27:32 PM By: Phillis Haggis, Dondra Prader RN Entered By: Phillis Haggis, Dondra Prader on 11/25/2020 10:39:24 Rodney Oneal ([id number]) -------------------------------------------------------------------------------- Lower Extremity Assessment Details Patient Name: Rodney Oneal. Date of Service: 11/25/2020 10:00 AM Medical Record Number: [id number] Patient Account Number: [id number] Date of Birth/Sex: June 21, 1955 (65 y.o. M) Treating RN: Hansel Feinstein Primary Care Blu Lori: Lenon Oms Other Clinician: Referring Jaxyn Mestas: Lenon Oms Treating Deaglan Lile/Extender: Tilda Franco in Treatment: 3 Edema Assessment Assessed: Kyra Searles: Yes] Franne Forts: No] Edema: [Left: N] [Right: o] Calf Left: Right: Point of Measurement: 34 cm From Medial Instep 37 cm Ankle Left: Right: Point of Measurement: 10 cm From Medial Instep 23 cm Vascular Assessment Pulses: Dorsalis Pedis Palpable: [Left:Yes] Electronic Signature(s) Signed: 11/25/2020 3:28:20 PM By: Hansel Feinstein Entered By: Hansel Feinstein on 11/25/2020 10:03:19 Rodney Oneal  ([id number]) -------------------------------------------------------------------------------- Multi Wound Chart Details Patient Name: Rodney Oneal. Date of Service: 11/25/2020 10:00 AM Medical Record Number: [id number] Patient Account Number: [id number] Date of Birth/Sex: Feb 22, 1956 (65 y.o. M) Treating RN: Rogers Blocker Primary Care Shylyn Younce: Lenon Oms Other Clinician: Referring Valery Amedee: Lenon Oms Treating Jlynn Langille/Extender: Tilda Franco in Treatment: 3 Vital Signs Height(in): 71 Pulse(bpm): 108 Weight(lbs): 311 Blood Pressure(mmHg): 149/86 Body Mass Index(BMI): 43 Temperature(F): 99.2 Respiratory Rate(breaths/min): 20 Photos: [N/A:N/A] Wound Location: Left, Medial Lower Leg N/A N/A Wounding Event: Skin Tear/Laceration N/A N/A Primary Etiology: Trauma, Other N/A N/A Comorbid History: Cataracts, Sleep Apnea, N/A N/A Arrhythmia, Coronary Artery Disease, Hypertension, Type II Diabetes, Osteoarthritis Date Acquired: 10/21/2020 N/A N/A Weeks of Treatment: 3 N/A N/A Wound Status: Open N/A N/A Measurements L x W x D (cm) 2.9x1.5x1.5 N/A N/A Area (cm) : 3.416 N/A N/A Volume (cm) : 5.125 N/A N/A % Reduction in Area: -16.00% N/A N/A % Reduction in Volume: -1637.30% N/A N/A Starting Position 1 (o'clock): 12 Ending Position 1 (o'clock): 6 Maximum Distance 1 (cm): 1 Undermining: Yes N/A N/A Classification: Full Thickness Without Exposed N/A N/A Support Structures Exudate Amount: Large N/A N/A Exudate Type: Sanguinous N/A N/A Exudate Color: red N/A N/A Granulation Amount: Large (67-100%) N/A N/A Granulation Quality: Red, Pink N/A N/A Necrotic Amount: Small (1-33%) N/A N/A Exposed Structures: Fat Layer (Subcutaneous Tissue): N/A N/A Yes Fascia: No Tendon: No Muscle: No Joint: No Bone: No Epithelialization: None N/A N/A Debridement: Debridement - Excisional N/A N/A Pre-procedure Verification/Time 10:35 N/A N/A Out Taken: Tissue Debrided: Subcutaneous,  Slough N/A N/A Level: Skin/Subcutaneous Tissue N/A N/A Debridement Area (sq cm): 4.35 N/A N/A Instrument: Curette N/A N/A Bleeding: Moderate N/A N/A GRISELDA, TOSH ([id number]) Hemostasis Achieved: Gel Foam N/A N/A Debridement Treatment Procedure was tolerated well N/A N/A Response: Post Debridement 2.9x1.5x1.6 N/A N/A Measurements L x W x D (cm) Post Debridement Volume: 5.466 N/A N/A (cm) Procedures Performed: Compression Therapy N/A N/A Debridement Treatment Notes Electronic Signature(s) Signed: 11/25/2020 10:49:14 AM By: Geralyn Corwin DO Entered By: Geralyn Corwin on 11/25/2020 10:43:49 Rodney Oneal ([id number]) -------------------------------------------------------------------------------- Multi-Disciplinary Care Plan Details Patient Name: JIBRI, SCHRIEFER. Date of Service: 11/25/2020 10:00 AM Medical Record Number: [id number] Patient Account Number: [id number] Date  of Birth/Sex: 02/22/1956 (65 y.o. M) Treating RN: Rogers BlockerSanchez, Kenia Primary Care Lunden Stieber: Lenon OmsGauger, Sarah Other Clinician: Referring Jawuan Robb: Lenon OmsGauger, Sarah Treating Gwendalynn Eckstrom/Extender: Tilda FrancoHoffman, Jessica Weeks in Treatment: 3 Active Inactive Necrotic Tissue Nursing Diagnoses: Impaired tissue integrity related to necrotic/devitalized tissue Goals: Necrotic/devitalized tissue will be minimized in the wound bed Date Initiated: 11/04/2020 Target Resolution Date: 11/04/2020 Goal Status: Active Patient/caregiver will verbalize understanding of reason and process for debridement of necrotic tissue Date Initiated: 11/04/2020 Target Resolution Date: 11/04/2020 Goal Status: Active Interventions: Assess patient pain level pre-, during and post procedure and prior to discharge Provide education on necrotic tissue and debridement process Treatment Activities: Apply topical anesthetic as ordered : 11/04/2020 Enzymatic debridement : 11/04/2020 Excisional debridement : 11/04/2020 Notes: Wound/Skin Impairment Nursing  Diagnoses: Impaired tissue integrity Goals: Patient/caregiver will verbalize understanding of skin care regimen Date Initiated: 11/04/2020 Target Resolution Date: 11/04/2020 Goal Status: Active Ulcer/skin breakdown will have a volume reduction of 30% by week 4 Date Initiated: 11/04/2020 Target Resolution Date: 12/04/2020 Goal Status: Active Ulcer/skin breakdown will have a volume reduction of 50% by week 8 Date Initiated: 11/04/2020 Target Resolution Date: 01/04/2021 Goal Status: Active Ulcer/skin breakdown will have a volume reduction of 80% by week 12 Date Initiated: 11/04/2020 Target Resolution Date: 02/04/2021 Goal Status: Active Ulcer/skin breakdown will heal within 14 weeks Date Initiated: 11/04/2020 Target Resolution Date: 03/06/2021 Goal Status: Active Interventions: Assess patient/caregiver ability to obtain necessary supplies Assess patient/caregiver ability to perform ulcer/skin care regimen upon admission and as needed Assess ulceration(s) every visit Provide education on ulcer and skin care Treatment Activities: Rodney IdolRKER, Marciano R. ([id number]) Referred to DME Terrin Meddaugh for dressing supplies : 11/04/2020 Skin care regimen initiated : 11/04/2020 Notes: Electronic Signature(s) Signed: 11/25/2020 12:27:32 PM By: Phillis HaggisSanchez Pereyda, Dondra PraderKenia RN Entered By: Phillis HaggisSanchez Pereyda, Dondra PraderKenia on 11/25/2020 10:33:31 Rodney IdolPARKER, Bronc R. ([id number]) -------------------------------------------------------------------------------- Pain Assessment Details Patient Name: Rodney IdolPARKER, Mart R. Date of Service: 11/25/2020 10:00 AM Medical Record Number: [id number] Patient Account Number: [id number] Date of Birth/Sex: 06/13/1955 (65 y.o. M) Treating RN: Hansel FeinsteinBishop, Joy Primary Care Burnie Hank: Lenon OmsGauger, Sarah Other Clinician: Referring Skila Rollins: Lenon OmsGauger, Sarah Treating Marge Vandermeulen/Extender: Tilda FrancoHoffman, Jessica Weeks in Treatment: 3 Active Problems Location of Pain Severity and Description of Pain Patient Has Paino Yes Site Locations Pain  Location: Generalized Pain Rate the pain. Current Pain Level: 4 Pain Management and Medication Current Pain Management: Electronic Signature(s) Signed: 11/25/2020 3:28:20 PM By: Hansel FeinsteinBishop, Joy Entered By: Hansel FeinsteinBishop, Joy on 11/25/2020 09:54:51 Rodney IdolPARKER, Bassem R. ([id number]) -------------------------------------------------------------------------------- Patient/Caregiver Education Details Patient Name: Rodney IdolPARKER, Dale R. Date of Service: 11/25/2020 10:00 AM Medical Record Number: [id number] Patient Account Number: [id number] Date of Birth/Gender: 03/17/1956 (65 y.o. M) Treating RN: Rogers BlockerSanchez, Kenia Primary Care Physician: Lenon OmsGauger, Sarah Other Clinician: Referring Physician: Lenon OmsGauger, Sarah Treating Physician/Extender: Tilda FrancoHoffman, Jessica Weeks in Treatment: 3 Education Assessment Education Provided To: Patient Education Topics Provided Wound/Skin Impairment: Methods: Explain/Verbal Responses: State content correctly Electronic Signature(s) Signed: 11/25/2020 12:27:32 PM By: Phillis HaggisSanchez Pereyda, Dondra PraderKenia RN Entered By: Phillis HaggisSanchez Pereyda, Dondra PraderKenia on 11/25/2020 10:38:43 Rodney IdolRKER, Heyward R. ([id number]) -------------------------------------------------------------------------------- Wound Assessment Details Patient Name: Rodney IdolRKER, Nafis R. Date of Service: 11/25/2020 10:00 AM Medical Record Number: [id number] Patient Account Number: [id number] Date of Birth/Sex: 03/27/1956 (65 y.o. M) Treating RN: Hansel FeinsteinBishop, Joy Primary Care Flordia Kassem: Lenon OmsGauger, Sarah Other Clinician: Referring Meygan Kyser: Lenon OmsGauger, Sarah Treating Denzil Mceachron/Extender: Tilda FrancoHoffman, Jessica Weeks in Treatment: 3 Wound Status Wound Number: 1 Primary Trauma, Other Etiology: Wound Location: Left, Medial Lower Leg Wound Open Wounding Event: Skin Tear/Laceration Status: Date Acquired: 10/21/2020 Comorbid Cataracts, Sleep Apnea, Arrhythmia, Coronary Artery Weeks Of  Treatment: 3 History: Disease, Hypertension, Type II Diabetes, Osteoarthritis Clustered Wound:  No Photos Wound Measurements Length: (cm) 2.9 Width: (cm) 1.5 Depth: (cm) 1.5 Area: (cm) 3.416 Volume: (cm) 5.125 % Reduction in Area: -16% % Reduction in Volume: -1637.3% Epithelialization: None Tunneling: No Undermining: Yes Starting Position (o'clock): 12 Ending Position (o'clock): 6 Maximum Distance: (cm) 1 Wound Description Classification: Full Thickness Without Exposed Support Structures Exudate Amount: Large Exudate Type: Sanguinous Exudate Color: red Foul Odor After Cleansing: No Slough/Fibrino Yes Wound Bed Granulation Amount: Large (67-100%) Exposed Structure Granulation Quality: Red, Pink Fascia Exposed: No Necrotic Amount: Small (1-33%) Fat Layer (Subcutaneous Tissue) Exposed: Yes Necrotic Quality: Adherent Slough Tendon Exposed: No Muscle Exposed: No Joint Exposed: No Bone Exposed: No Electronic Signature(s) Signed: 11/25/2020 3:28:20 PM By: Hansel Feinstein Entered By: Hansel Feinstein on 11/25/2020 10:02:13 Rodney Oneal ([id number]) -------------------------------------------------------------------------------- Vitals Details Patient Name: Rodney Oneal. Date of Service: 11/25/2020 10:00 AM Medical Record Number: [id number] Patient Account Number: [id number] Date of Birth/Sex: 25-Apr-1956 (65 y.o. M) Treating RN: Hansel Feinstein Primary Care Azaiah Mello: Lenon Oms Other Clinician: Referring Viyan Rosamond: Lenon Oms Treating Emonie Espericueta/Extender: Tilda Franco in Treatment: 3 Vital Signs Time Taken: 09:55 Temperature (F): 99.2 Height (in): 71 Pulse (bpm): 108 Weight (lbs): 311 Respiratory Rate (breaths/min): 20 Body Mass Index (BMI): 43.4 Blood Pressure (mmHg): 149/86 Reference Range: 80 - 120 mg / dl Electronic Signature(s) Signed: 11/25/2020 3:28:20 PM By: Hansel Feinstein Entered ByHansel Feinstein on 11/25/2020 09:54:34

## 2020-12-02 ENCOUNTER — Encounter: Payer: Medicare Other | Attending: Internal Medicine | Admitting: Internal Medicine

## 2020-12-02 ENCOUNTER — Other Ambulatory Visit: Payer: Self-pay

## 2020-12-02 DIAGNOSIS — W2203XA Walked into furniture, initial encounter: Secondary | ICD-10-CM | POA: Insufficient documentation

## 2020-12-02 DIAGNOSIS — I482 Chronic atrial fibrillation, unspecified: Secondary | ICD-10-CM | POA: Insufficient documentation

## 2020-12-02 DIAGNOSIS — Z7901 Long term (current) use of anticoagulants: Secondary | ICD-10-CM | POA: Diagnosis not present

## 2020-12-02 DIAGNOSIS — L97828 Non-pressure chronic ulcer of other part of left lower leg with other specified severity: Secondary | ICD-10-CM | POA: Insufficient documentation

## 2020-12-02 DIAGNOSIS — E11622 Type 2 diabetes mellitus with other skin ulcer: Secondary | ICD-10-CM | POA: Insufficient documentation

## 2020-12-02 DIAGNOSIS — Z87891 Personal history of nicotine dependence: Secondary | ICD-10-CM | POA: Diagnosis not present

## 2020-12-02 NOTE — Progress Notes (Signed)
TUCKER, MINTER ([id number]) Visit Report for 12/02/2020 Chief Complaint Document Details Patient Name: Rodney Oneal, Rodney Oneal. Date of Service: 12/02/2020 11:15 AM Medical Record Number: [id number] Patient Account Number: [id number] Date of Birth/Sex: [birth year] (65 y.o. M) Treating RN: Rogers Blocker Primary Care Provider: Lenon Oms Other Clinician: Referring Provider: Lenon Oms Treating Provider/Extender: Tilda Franco in Treatment: 4 Information Obtained from: Patient Chief Complaint 11/04/2020; patient is here for review of a hematoma on his left lower leg Electronic Signature(s) Signed: 12/02/2020 12:33:55 PM By: Geralyn Corwin DO Entered By: Geralyn Corwin on 12/02/2020 12:31:15 Rodney Oneal ([id number]) -------------------------------------------------------------------------------- Debridement Details Patient Name: Rodney Oneal. Date of Service: 12/02/2020 11:15 AM Medical Record Number: [id number] Patient Account Number: [id number] Date of Birth/Sex: 07-Mar-1956 (65 y.o. M) Treating RN: Hansel Feinstein Primary Care Provider: Lenon Oms Other Clinician: Referring Provider: Lenon Oms Treating Provider/Extender: Tilda Franco in Treatment: 4 Debridement Performed for Wound #1 Left,Medial Lower Leg Assessment: Performed By: Physician Geralyn Corwin, MD Debridement Type: Debridement Level of Consciousness (Pre- Awake and Alert procedure): Pre-procedure Verification/Time Out Yes - 10:09 Taken: Start Time: 10:09 Total Area Debrided (L x W): 2 (cm) x 1 (cm) = 2 (cm) Tissue and other material Slough, Subcutaneous, Slough debrided: Level: Skin/Subcutaneous Tissue Debridement Description: Excisional Instrument: Curette Bleeding: Moderate Hemostasis Achieved: Silver Nitrate End Time: 10:11 Response to Treatment: Procedure was tolerated well Level of Consciousness (Post- Awake and Alert procedure): Post Debridement Measurements of Total  Wound Length: (cm) 2.74 Width: (cm) 1.5 Depth: (cm) 0.8 Volume: (cm) 2.582 Character of Wound/Ulcer Post Debridement: Improved Post Procedure Diagnosis Same as Pre-procedure Electronic Signature(s) Signed: 12/02/2020 12:33:55 PM By: Geralyn Corwin DO Signed: 12/02/2020 4:25:48 PM By: Hansel Feinstein Entered By: Hansel Feinstein on 12/02/2020 12:11:12 Rodney Oneal ([id number]) -------------------------------------------------------------------------------- HPI Details Patient Name: Rodney Oneal. Date of Service: 12/02/2020 11:15 AM Medical Record Number: [id number] Patient Account Number: [id number] Date of Birth/Sex: 12-29-1955 (65 y.o. M) Treating RN: Rogers Blocker Primary Care Provider: Lenon Oms Other Clinician: Referring Provider: Lenon Oms Treating Provider/Extender: Tilda Franco in Treatment: 4 History of Present Illness HPI Description: ADMISSION 11/04/2020 This is a 65 year old man who is on chronic Coumadin secondary to atrial fibrillation. He had a fall on 5/25 out of bed but he managed to hit his anterior left leg on a tool that was lying in the vicinity. He was seen at his primary doctor's office on 10/30/2020 and there was a concern for cellulitis he was put on cephalexin 500 3 times daily that he still taking. He currently has a significant swelling with some nonviable skin on top of it. He says this is begin leaking but it is not frankly open. He is not complaining of a lot of pain. Past medical history includes type 2 diabetes, chronic A. fib on Coumadin, hypertension, history of a CVA ABI in this clinic was 0.92 on the left 6/15 left medial lower leg hematoma secondary to trauma on Coumadin. The patient arrives back in clinic with a week of 4-layer compression with calcium alginate with the area on his left medial lower leg just above the ankle looking completely the same. No evidence that this is reabsorbing. Still very tight. I do not see any evidence of  infection however I was hoping for some improvement in the amount of periwound swelling. 6/22; left medial lower leg hematoma secondary to trauma in a patient on chronic Coumadin. Last week I made the decision to open this. He had quite a bit of undermining  in the deep wound. We use silver alginate. Really dramatic improvement in the undermining although the wound is still deep. 6/29; patient presents for 1 week follow-up. He has been using Hydrofera Blue under 4-layer compression and tolerating this well. He denies signs of infection. 7/6; patient presents for 1 week follow-up. He has been using Hydrofera Blue under 4-layer compression and tolerating this well. He denies signs of infection. He has no issues or complaints today. Electronic Signature(s) Signed: 12/02/2020 12:33:55 PM By: Geralyn Corwin DO Entered By: Geralyn Corwin on 12/02/2020 12:31:44 Rodney Oneal ([id number]) -------------------------------------------------------------------------------- Physical Exam Details Patient Name: Rodney Oneal, Rodney Oneal. Date of Service: 12/02/2020 11:15 AM Medical Record Number: [id number] Patient Account Number: [id number] Date of Birth/Sex: [birth year] (65 y.o. M) Treating RN: Rogers Blocker Primary Care Provider: Lenon Oms Other Clinician: Referring Provider: Lenon Oms Treating Provider/Extender: Tilda Franco in Treatment: 4 Constitutional . Cardiovascular . Psychiatric . Notes Left lower extremity: On the anterior aspect there is a large deep wound with granulation tissue and nonviable tissue present. There is no exposed bone. No evidence of infection. Electronic Signature(s) Signed: 12/02/2020 12:33:55 PM By: Geralyn Corwin DO Entered By: Geralyn Corwin on 12/02/2020 12:32:13 Rodney Oneal ([id number]) -------------------------------------------------------------------------------- Physician Orders Details Patient Name: Rodney Oneal, Rodney Oneal. Date of Service: 12/02/2020  11:15 AM Medical Record Number: [id number] Patient Account Number: [id number] Date of Birth/Sex: 11/03/1955 (65 y.o. M) Treating RN: Hansel Feinstein Primary Care Provider: Lenon Oms Other Clinician: Referring Provider: Lenon Oms Treating Provider/Extender: Tilda Franco in Treatment: 4 Verbal / Phone Orders: No Diagnosis Coding Follow-up Appointments o Return Appointment in 1 week. o Nurse Visit as needed Bathing/ Shower/ Hygiene o May shower with wound dressing protected with water repellent cover or cast protector. o No tub bath. Edema Control - Lymphedema / Segmental Compressive Device / Other o Optional: One layer of unna paste to top of compression wrap (to act as an anchor). Medications-Please add to medication list. o Other: - Tylenol for pain Wound Treatment Wound #1 - Lower Leg Wound Laterality: Left, Medial Cleanser: Soap and Water 1 x Per Week/15 Days Discharge Instructions: Gently cleanse wound with antibacterial soap, rinse and pat dry prior to dressing wounds Topical: Santyl Collagenase Ointment, 30 (gm), tube 1 x Per Week/15 Days Discharge Instructions: Apply nickel thick to wound bed only Primary Dressing: Gauze 1 x Per Week/15 Days Discharge Instructions: Dry gauze as bolster on top on hydrofera Primary Dressing: Hydrofera Blue Ready Transfer Foam, 2.5x2.5 (in/in) 1 x Per Week/15 Days Discharge Instructions: Cut Hydrofera Blue Ready, pack in undermining and apply to wound bed Secondary Dressing: Xtrasorb Medium 4x5 (in/in) 1 x Per Week/15 Days Discharge Instructions: Apply to wound as directed. Do not cut. Compression Wrap: Medichoice 4 layer Compression System, 35-40 mmHG 1 x Per Week/15 Days Discharge Instructions: Apply multi-layer wrap as directed. Electronic Signature(s) Signed: 12/02/2020 12:33:55 PM By: Geralyn Corwin DO Signed: 12/02/2020 4:25:48 PM By: Hansel Feinstein Entered By: Hansel Feinstein on 12/02/2020 12:02:16 Rodney Oneal  ([id number]) -------------------------------------------------------------------------------- Problem List Details Patient Name: JADARIUS, COMMONS. Date of Service: 12/02/2020 11:15 AM Medical Record Number: [id number] Patient Account Number: [id number] Date of Birth/Sex: 05-03-1956 (65 y.o. M) Treating RN: Rogers Blocker Primary Care Provider: Lenon Oms Other Clinician: Referring Provider: Lenon Oms Treating Provider/Extender: Tilda Franco in Treatment: 4 Active Problems ICD-10 Encounter Code Description Active Date MDM Diagnosis S80.12XD Contusion of left lower leg, subsequent encounter 11/04/2020 No Yes L97.828 Non-pressure chronic ulcer of other part of left lower leg  with other 11/04/2020 No Yes specified severity T45.515S Adverse effect of anticoagulants, sequela 11/04/2020 No Yes Inactive Problems Resolved Problems Electronic Signature(s) Signed: 12/02/2020 12:33:55 PM By: Geralyn CorwinHoffman, Chyna Kneece DO Entered By: Geralyn CorwinHoffman, Jaylenne Hamelin on 12/02/2020 12:30:59 Rodney Oneal, Rodney R. ([id number]) -------------------------------------------------------------------------------- Progress Note Details Patient Name: Rodney Oneal, Rodney R. Date of Service: 12/02/2020 11:15 AM Medical Record Number: [id number] Patient Account Number: [id number] Date of Birth/Sex: 04/17/1956 (65 y.o. M) Treating RN: Rogers BlockerSanchez, Kenia Primary Care Provider: Lenon OmsGauger, Sarah Other Clinician: Referring Provider: Lenon OmsGauger, Sarah Treating Provider/Extender: Tilda FrancoHoffman, Cruzita Lipa Weeks in Treatment: 4 Subjective Chief Complaint Information obtained from Patient 11/04/2020; patient is here for review of a hematoma on his left lower leg History of Present Illness (HPI) ADMISSION 11/04/2020 This is a 65 year old man who is on chronic Coumadin secondary to atrial fibrillation. He had a fall on 5/25 out of bed but he managed to hit his anterior left leg on a tool that was lying in the vicinity. He was seen at his primary doctor's office on 10/30/2020  and there was a concern for cellulitis he was put on cephalexin 500 3 times daily that he still taking. He currently has a significant swelling with some nonviable skin on top of it. He says this is begin leaking but it is not frankly open. He is not complaining of a lot of pain. Past medical history includes type 2 diabetes, chronic A. fib on Coumadin, hypertension, history of a CVA ABI in this clinic was 0.92 on the left 6/15 left medial lower leg hematoma secondary to trauma on Coumadin. The patient arrives back in clinic with a week of 4-layer compression with calcium alginate with the area on his left medial lower leg just above the ankle looking completely the same. No evidence that this is reabsorbing. Still very tight. I do not see any evidence of infection however I was hoping for some improvement in the amount of periwound swelling. 6/22; left medial lower leg hematoma secondary to trauma in a patient on chronic Coumadin. Last week I made the decision to open this. He had quite a bit of undermining in the deep wound. We use silver alginate. Really dramatic improvement in the undermining although the wound is still deep. 6/29; patient presents for 1 week follow-up. He has been using Hydrofera Blue under 4-layer compression and tolerating this well. He denies signs of infection. 7/6; patient presents for 1 week follow-up. He has been using Hydrofera Blue under 4-layer compression and tolerating this well. He denies signs of infection. He has no issues or complaints today. Patient History Information obtained from Patient. Social History Former smoker - ended on 05/30/2013, Marital Status - Married, Alcohol Use - Never, Drug Use - No History, Caffeine Use - Rarely. Medical History Eyes Patient has history of Cataracts - surgery Respiratory Patient has history of Sleep Apnea Cardiovascular Patient has history of Arrhythmia - afib, Coronary Artery Disease, Hypertension Endocrine Patient  has history of Type II Diabetes Musculoskeletal Patient has history of Osteoarthritis Objective Constitutional Vitals Time Taken: 11:50 AM, Height: 71 in, Weight: 311 lbs, BMI: 43.4, Temperature: 98.4 F, Pulse: 87 bpm, Respiratory Rate: 18 breaths/min, Hubble, Riggin R. ([id number]) Blood Pressure: 160/70 mmHg. General Notes: Left lower extremity: On the anterior aspect there is a large deep wound with granulation tissue and nonviable tissue present. There is no exposed bone. No evidence of infection. Integumentary (Hair, Skin) Wound #1 status is Open. Original cause of wound was Skin Tear/Laceration. The date acquired was: 10/21/2020. The wound has been in  treatment 4 weeks. The wound is located on the Left,Medial Lower Leg. The wound measures 2.7cm length x 1.5cm width x 0.8cm depth; 3.181cm^2 area and 2.545cm^3 volume. There is Fat Layer (Subcutaneous Tissue) exposed. There is no tunneling noted, however, there is undermining starting at 12:00 and ending at 3:00 with a maximum distance of 0.8cm. There is a large amount of sanguinous drainage noted. There is large (67-100%) red, pink granulation within the wound bed. There is a small (1-33%) amount of necrotic tissue within the wound bed including Adherent Slough. Assessment Active Problems ICD-10 Contusion of left lower leg, subsequent encounter Non-pressure chronic ulcer of other part of left lower leg with other specified severity Adverse effect of anticoagulants, sequela Patient's wound has shown some improvement since last clinic visit. I debrided nonviable tissue. No signs of infection on exam. I would like to continue with Hydrofera Blue, Santyl under 4-layer compression. I will see him back in 1 week Procedures Wound #1 Pre-procedure diagnosis of Wound #1 is a Trauma, Other located on the Left,Medial Lower Leg . There was a Excisional Skin/Subcutaneous Tissue Debridement with a total area of 2 sq cm performed by Geralyn Corwin,  MD. With the following instrument(s): Curette Material removed includes Subcutaneous Tissue and Slough and. A time out was conducted at 10:09, prior to the start of the procedure. A Moderate amount of bleeding was controlled with Silver Nitrate. The procedure was tolerated well. Post Debridement Measurements: 2.74cm length x 1.5cm width x 0.8cm depth; 2.582cm^3 volume. Character of Wound/Ulcer Post Debridement is improved. Post procedure Diagnosis Wound #1: Same as Pre-Procedure Pre-procedure diagnosis of Wound #1 is a Trauma, Other located on the Left,Medial Lower Leg . There was a Four Layer Compression Therapy Procedure by Hansel Feinstein, RN. Post procedure Diagnosis Wound #1: Same as Pre-Procedure Plan Follow-up Appointments: Return Appointment in 1 week. Nurse Visit as needed Bathing/ Shower/ Hygiene: May shower with wound dressing protected with water repellent cover or cast protector. No tub bath. Edema Control - Lymphedema / Segmental Compressive Device / Other: Optional: One layer of unna paste to top of compression wrap (to act as an anchor). Medications-Please add to medication list.: Other: - Tylenol for pain WOUND #1: - Lower Leg Wound Laterality: Left, Medial Cleanser: Soap and Water 1 x Per Week/15 Days Discharge Instructions: Gently cleanse wound with antibacterial soap, rinse and pat dry prior to dressing wounds Topical: Santyl Collagenase Ointment, 30 (gm), tube 1 x Per Week/15 Days Discharge Instructions: Apply nickel thick to wound bed only Primary Dressing: Gauze 1 x Per Week/15 Days Rodney Oneal, Rodney Oneal ([id number]) Discharge Instructions: Dry gauze as bolster on top on hydrofera Primary Dressing: Hydrofera Blue Ready Transfer Foam, 2.5x2.5 (in/in) 1 x Per Week/15 Days Discharge Instructions: Cut Hydrofera Blue Ready, pack in undermining and apply to wound bed Secondary Dressing: Xtrasorb Medium 4x5 (in/in) 1 x Per Week/15 Days Discharge Instructions: Apply to wound as  directed. Do not cut. Compression Wrap: Medichoice 4 layer Compression System, 35-40 mmHG 1 x Per Week/15 Days Discharge Instructions: Apply multi-layer wrap as directed. 1. In office sharp debridement 2. Continue Hydrofera Blue, Santyl under 4-layer compression 3. Follow-up in 1 week Electronic Signature(s) Signed: 12/02/2020 12:33:55 PM By: Geralyn Corwin DO Entered By: Geralyn Corwin on 12/02/2020 12:33:18 Rodney Oneal ([id number]) -------------------------------------------------------------------------------- ROS/PFSH Details Patient Name: Rodney Oneal. Date of Service: 12/02/2020 11:15 AM Medical Record Number: [id number] Patient Account Number: [id number] Date of Birth/Sex: 1955/06/03 (65 y.o. M) Treating RN: Rogers Blocker Primary Care Provider: Bayard Males,  Sarah Other Clinician: Referring Provider: Lenon Oms Treating Provider/Extender: Tilda Franco in Treatment: 4 Information Obtained From Patient Eyes Medical History: Positive for: Cataracts - surgery Respiratory Medical History: Positive for: Sleep Apnea Cardiovascular Medical History: Positive for: Arrhythmia - afib; Coronary Artery Disease; Hypertension Endocrine Medical History: Positive for: Type II Diabetes Time with diabetes: 2002 Treated with: Insulin, Oral agents Blood sugar tested every day: No Musculoskeletal Medical History: Positive for: Osteoarthritis HBO Extended History Items Eyes: Cataracts Immunizations Pneumococcal Vaccine: Received Pneumococcal Vaccination: Yes Implantable Devices None Family and Social History Former smoker - ended on 05/30/2013; Marital Status - Married; Alcohol Use: Never; Drug Use: No History; Caffeine Use: Rarely; Financial Concerns: No; Food, Clothing or Shelter Needs: No; Support System Lacking: No; Transportation Concerns: No Electronic Signature(s) Signed: 12/02/2020 12:33:55 PM By: Geralyn Corwin DO Signed: 12/02/2020 5:07:08 PM By: Rogers Blocker  RN Entered By: Geralyn Corwin on 12/02/2020 12:31:51 Rodney Oneal, Rodney Oneal ([id number]) -------------------------------------------------------------------------------- SuperBill Details Patient Name: Rodney Oneal, Rodney Oneal. Date of Service: 12/02/2020 Medical Record Number: [id number] Patient Account Number: [id number] Date of Birth/Sex: 08-30-1955 (65 y.o. M) Treating RN: Hansel Feinstein Primary Care Provider: Lenon Oms Other Clinician: Referring Provider: Lenon Oms Treating Provider/Extender: Tilda Franco in Treatment: 4 Diagnosis Coding ICD-10 Codes Code Description [phone] Contusion of left lower leg, subsequent encounter 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 specified severity T45.515S Adverse effect of anticoagulants, sequela Facility Procedures CPT4 Code: [id number] Description: 11042 - DEB SUBQ TISSUE 20 SQ CM/< Modifier: Quantity: 1 CPT4 Code: Description: ICD-10 Diagnosis Description 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 spec Modifier: ified severity Quantity: Physician Procedures CPT4 Code: [id number] Description: 11042 - WC PHYS SUBQ TISS 20 SQ CM Modifier: Quantity: 1 CPT4 Code: Description: ICD-10 Diagnosis Description 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 spec Modifier: ified severity Quantity: Electronic Signature(s) Signed: 12/02/2020 12:33:55 PM By: Geralyn Corwin DO Entered By: Geralyn Corwin on 12/02/2020 12:33:32

## 2020-12-02 NOTE — Progress Notes (Signed)
JABES, PRIMO ([id number]) Visit Report for 12/02/2020 Arrival Information Details Patient Name: Rodney, Oneal. Date of Service: 12/02/2020 11:15 AM Medical Record Number: [id number] Patient Account Number: [id number] Date of Birth/Sex: [birth year] (65 y.o. M) Treating RN: Donnamarie Poag Primary Care Anessia [place]: Gaetano Net Other Clinician: Referring Saavi Mceachron: Gaetano Net Treating Nevaeha Finerty/Extender: Yaakov Guthrie in Treatment: 4 Visit Information History Since Last Visit Added or deleted any medications: No Patient Arrived: Ambulatory Had a fall or experienced change in No Arrival Time: 11:49 activities of daily living that may affect Accompanied By: self risk of falls: Transfer Assistance: None Hospitalized since last visit: No Patient Requires Transmission-Based No Has Dressing in Place as Prescribed: Yes Precautions: Has Compression in Place as Prescribed: Yes Patient Has Alerts: Yes Pain Present Now: No Patient Alerts: Patient on Blood Thinner DIABETIC COUMADIN Electronic Signature(s) Signed: 12/02/2020 4:25:48 PM By: Donnamarie Poag Entered By: Donnamarie Poag on 12/02/2020 11:49:51 Rodney Oneal ([id number]) -------------------------------------------------------------------------------- Clinic Level of Care Assessment Details Patient Name: Rodney Oneal. Date of Service: 12/02/2020 11:15 AM Medical Record Number: [id number] Patient Account Number: [id number] Date of Birth/Sex: 05/19/1956 (65 y.o. M) Treating RN: Donnamarie Poag Primary Care Filbert Craze: Gaetano Net Other Clinician: Referring Dejon Jungman: Gaetano Net Treating Denelle Capurro/Extender: Yaakov Guthrie in Treatment: 4 Clinic Level of Care Assessment Items TOOL 1 Quantity Score []  - Use when EandM and Procedure is performed on INITIAL visit 0 ASSESSMENTS - Nursing Assessment / Reassessment []  - General Physical Exam (combine w/ comprehensive assessment (listed just below) when performed on new 0 pt.  evals) []  - 0 Comprehensive Assessment (HX, ROS, Risk Assessments, Wounds Hx, etc.) ASSESSMENTS - Wound and Skin Assessment / Reassessment []  - Dermatologic / Skin Assessment (not related to wound area) 0 ASSESSMENTS - Ostomy and/or Continence Assessment and Care []  - Incontinence Assessment and Management 0 []  - 0 Ostomy Care Assessment and Management (repouching, etc.) PROCESS - Coordination of Care []  - Simple Patient / Family Education for ongoing care 0 []  - 0 Complex (extensive) Patient / Family Education for ongoing care []  - 0 Staff obtains Programmer, systems, Records, Test Results / Process Orders []  - 0 Staff telephones HHA, Nursing Homes / Clarify orders / etc []  - 0 Routine Transfer to another Facility (non-emergent condition) []  - 0 Routine Hospital Admission (non-emergent condition) []  - 0 New Admissions / Biomedical engineer / Ordering NPWT, Apligraf, etc. []  - 0 Emergency Hospital Admission (emergent condition) PROCESS - Special Needs []  - Pediatric / Minor Patient Management 0 []  - 0 Isolation Patient Management []  - 0 Hearing / Language / Visual special needs []  - 0 Assessment of Community assistance (transportation, D/C planning, etc.) []  - 0 Additional assistance / Altered mentation []  - 0 Support Surface(s) Assessment (bed, cushion, seat, etc.) INTERVENTIONS - Miscellaneous []  - External ear exam 0 []  - 0 Patient Transfer (multiple staff / Civil Service fast streamer / Similar devices) []  - 0 Simple Staple / Suture removal (25 or less) []  - 0 Complex Staple / Suture removal (26 or more) []  - 0 Hypo/Hyperglycemic Management (do not check if billed separately) []  - 0 Ankle / Brachial Index (ABI) - do not check if billed separately Has the patient been seen at the hospital within the last three years: Yes Total Score: 0 Level Of Care: ____ Rodney Oneal ([id number]) Electronic Signature(s) Signed: 12/02/2020 4:25:48 PM By: Donnamarie Poag Entered By: Donnamarie Poag on  12/02/2020 12:10:30 Rodney Oneal ([id number]) -------------------------------------------------------------------------------- Compression Therapy Details Patient Name: Rodney Oneal. Date of  Service: 12/02/2020 11:15 AM Medical Record Number: [id number] Patient Account Number: [id number] Date of Birth/Sex: [birth year] (65 y.o. M) Treating RN: Donnamarie Poag Primary Care Holman Bonsignore: Gaetano Net Other Clinician: Referring Jamarii Banks: Gaetano Net Treating Iveliz Garay/Extender: Yaakov Guthrie in Treatment: 4 Compression Therapy Performed for Wound Assessment: Wound #1 Left,Medial Lower Leg Performed By: Junius Argyle, RN Compression Type: Four Layer Post Procedure Diagnosis Same as Pre-procedure Electronic Signature(s) Signed: 12/02/2020 4:25:48 PM By: Donnamarie Poag Entered By: Donnamarie Poag on 12/02/2020 12:07:57 Rodney Oneal ([id number]) -------------------------------------------------------------------------------- Encounter Discharge Information Details Patient Name: Rodney Oneal. Date of Service: 12/02/2020 11:15 AM Medical Record Number: [id number] Patient Account Number: [id number] Date of Birth/Sex: 11-Oct-1955 (65 y.o. M) Treating RN: Donnamarie Poag Primary Care Carrolyn Hilmes: Gaetano Net Other Clinician: Referring Jacqulyne Gladue: Gaetano Net Treating Umberto Pavek/Extender: Yaakov Guthrie in Treatment: 4 Encounter Discharge Information Items Post Procedure Vitals Discharge Condition: Stable Temperature (F): 98.4 Ambulatory Status: Ambulatory Pulse (bpm): 87 Discharge Destination: Home Respiratory Rate (breaths/min): 18 Transportation: Private Auto Blood Pressure (mmHg): 160/70 Accompanied By: self Schedule Follow-up Appointment: Yes Clinical Summary of Care: Electronic Signature(s) Signed: 12/02/2020 4:25:48 PM By: Donnamarie Poag Entered By: Donnamarie Poag on 12/02/2020 12:19:03 Rodney Oneal  ([id number]) -------------------------------------------------------------------------------- Lower Extremity Assessment Details Patient Name: Rodney Oneal. Date of Service: 12/02/2020 11:15 AM Medical Record Number: [id number] Patient Account Number: [id number] Date of Birth/Sex: [birth year] (65 y.o. M) Treating RN: Donnamarie Poag Primary Care Zykee Avakian: Gaetano Net Other Clinician: Referring Kris No: Gaetano Net Treating Berley Gambrell/Extender: Yaakov Guthrie in Treatment: 4 Edema Assessment Assessed: Shirlyn Goltz: Yes] Rodney Oneal: No] Edema: [Left: N] [Right: o] Calf Left: Right: Point of Measurement: 34 cm From Medial Instep 37 cm Ankle Left: Right: Point of Measurement: 10 cm From Medial Instep 23.5 cm Vascular Assessment Pulses: Dorsalis Pedis Palpable: [Left:Yes] Electronic Signature(s) Signed: 12/02/2020 4:25:48 PM By: Donnamarie Poag Entered By: Donnamarie Poag on 12/02/2020 12:00:40 Rodney Oneal ([id number]) -------------------------------------------------------------------------------- Multi Wound Chart Details Patient Name: Rodney Oneal. Date of Service: 12/02/2020 11:15 AM Medical Record Number: [id number] Patient Account Number: [id number] Date of Birth/Sex: October 30, 1955 (65 y.o. M) Treating RN: Donnamarie Poag Primary Care Donda Friedli: Gaetano Net Other Clinician: Referring Lauralee Waters: Gaetano Net Treating Jaynee Winters/Extender: Yaakov Guthrie in Treatment: 4 Vital Signs Height(in): 79 Pulse(bpm): 71 Weight(lbs): 311 Blood Pressure(mmHg): 160/70 Body Mass Index(BMI): 43 Temperature(F): 98.4 Respiratory Rate(breaths/min): 18 Photos: [N/A:N/A] Wound Location: Left, Medial Lower Leg N/A N/A Wounding Event: Skin Tear/Laceration N/A N/A Primary Etiology: Trauma, Other N/A N/A Comorbid History: Cataracts, Sleep Apnea, N/A N/A Arrhythmia, Coronary Artery Disease, Hypertension, Type II Diabetes, Osteoarthritis Date Acquired: 10/21/2020 N/A N/A Weeks of Treatment: 4 N/A  N/A Wound Status: Open N/A N/A Measurements L x W x D (cm) 2.7x1.5x0.8 N/A N/A Area (cm) : 3.181 N/A N/A Volume (cm) : 2.545 N/A N/A % Reduction in Area: -8.00% N/A N/A % Reduction in Volume: -762.70% N/A N/A Starting Position 1 (o'clock): 12 Ending Position 1 (o'clock): 3 Maximum Distance 1 (cm): 0.8 Undermining: Yes N/A N/A Classification: Full Thickness Without Exposed N/A N/A Support Structures Exudate Amount: Large N/A N/A Exudate Type: Sanguinous N/A N/A Exudate Color: red N/A N/A Granulation Amount: Large (67-100%) N/A N/A Granulation Quality: Red, Pink N/A N/A Necrotic Amount: Small (1-33%) N/A N/A Exposed Structures: Fat Layer (Subcutaneous Tissue): N/A N/A Yes Fascia: No Tendon: No Muscle: No Joint: No Bone: No Epithelialization: None N/A N/A Debridement: Debridement - Excisional N/A N/A Pre-procedure Verification/Time 10:09 N/A N/A Out Taken: Tissue Debrided: Subcutaneous, Slough N/A N/A Level: Skin/Subcutaneous Tissue N/A N/A  Debridement Area (sq cm): 2 N/A N/A Instrument: Curette N/A N/A Bleeding: Moderate N/A N/A Rodney, Oneal ([id number]) Hemostasis Achieved: Silver Nitrate N/A N/A Debridement Treatment Procedure was tolerated well N/A N/A Response: Post Debridement 2.74x1.5x0.8 N/A N/A Measurements L x W x D (cm) Post Debridement Volume: 2.582 N/A N/A (cm) Procedures Performed: Compression Therapy N/A N/A Debridement Treatment Notes Wound #1 (Lower Leg) Wound Laterality: Left, Medial Cleanser Soap and Water Discharge Instruction: Gently cleanse wound with antibacterial soap, rinse and pat dry prior to dressing wounds Peri-Wound Care Topical Santyl Collagenase Ointment, 30 (gm), tube Discharge Instruction: Apply nickel thick to wound bed only Primary Dressing Gauze Discharge Instruction: Dry gauze as bolster on top on hydrofera Hydrofera Blue Ready Transfer Foam, 2.5x2.5 (in/in) Discharge Instruction: Cut Hydrofera Blue Ready, pack in  undermining and apply to wound bed Secondary Dressing Xtrasorb Medium 4x5 (in/in) Discharge Instruction: Apply to wound as directed. Do not cut. Secured With Compression Wrap Medichoice 4 layer Compression System, 35-40 mmHG Discharge Instruction: Apply multi-layer wrap as directed. Compression Stockings Add-Ons Electronic Signature(s) Signed: 12/02/2020 12:33:55 PM By: Kalman Shan DO Entered By: Kalman Shan on 12/02/2020 12:31:06 Rodney Oneal ([id number]) -------------------------------------------------------------------------------- [place] Details Patient Name: Rodney, Oneal. Date of Service: 12/02/2020 11:15 AM Medical Record Number: [id number] Patient Account Number: [id number] Date of Birth/Sex: 26-Sep-1955 (65 y.o. M) Treating RN: Donnamarie Poag Primary Care Clarisa Danser: Gaetano Net Other Clinician: Referring Thurlow Gallaga: Gaetano Net Treating Adir Schicker/Extender: Yaakov Guthrie in Treatment: 4 Active Inactive Wound/Skin Impairment Nursing Diagnoses: Impaired tissue integrity Goals: Patient/caregiver will verbalize understanding of skin care regimen Date Initiated: 11/04/2020 Date Inactivated: 12/02/2020 Target Resolution Date: 11/04/2020 Goal Status: Met Ulcer/skin breakdown will have a volume reduction of 30% by week 4 Date Initiated: 11/04/2020 Target Resolution Date: 12/04/2020 Goal Status: Active Ulcer/skin breakdown will have a volume reduction of 50% by week 8 Date Initiated: 11/04/2020 Target Resolution Date: 01/04/2021 Goal Status: Active Ulcer/skin breakdown will have a volume reduction of 80% by week 12 Date Initiated: 11/04/2020 Target Resolution Date: 02/04/2021 Goal Status: Active Ulcer/skin breakdown will heal within 14 weeks Date Initiated: 11/04/2020 Target Resolution Date: 03/06/2021 Goal Status: Active Interventions: Assess patient/caregiver ability to obtain necessary supplies Assess patient/caregiver ability to perform  ulcer/skin care regimen upon admission and as needed Assess ulceration(s) every visit Provide education on ulcer and skin care Treatment Activities: Referred to DME Avamarie Crossley for dressing supplies : 11/04/2020 Skin care regimen initiated : 11/04/2020 Notes: Electronic Signature(s) Signed: 12/02/2020 4:25:48 PM By: Donnamarie Poag Entered By: Donnamarie Poag on 12/02/2020 12:01:01 Rodney Oneal ([id number]) -------------------------------------------------------------------------------- Pain Assessment Details Patient Name: Rodney Oneal. Date of Service: 12/02/2020 11:15 AM Medical Record Number: [id number] Patient Account Number: [id number] Date of Birth/Sex: Jan 05, 1956 (65 y.o. M) Treating RN: Donnamarie Poag Primary Care Mayah Urquidi: Gaetano Net Other Clinician: Referring Vincient Vanaman: Gaetano Net Treating Kolston Lacount/Extender: Yaakov Guthrie in Treatment: 4 Active Problems Location of Pain Severity and Description of Pain Patient Has Paino No Site Locations Rate the pain. Current Pain Level: 0 Pain Management and Medication Current Pain Management: Electronic Signature(s) Signed: 12/02/2020 4:25:48 PM By: Donnamarie Poag Entered By: Donnamarie Poag on 12/02/2020 11:57:00 Rodney Oneal ([id number]) -------------------------------------------------------------------------------- Patient/Caregiver Education Details Patient Name: Rodney Oneal. Date of Service: 12/02/2020 11:15 AM Medical Record Number: [id number] Patient Account Number: [id number] Date of Birth/Gender: [birth year] (65 y.o. M) Treating RN: Donnamarie Poag Primary Care Physician: Gaetano Net Other Clinician: Referring Physician: Gaetano Net Treating Physician/Extender: Yaakov Guthrie in Treatment: 4 Education Assessment Education Provided  To: Patient Education Topics Provided Basic Hygiene: Wound/Skin Impairment: Electronic Signature(s) Signed: 12/02/2020 4:25:48 PM By: Donnamarie Poag Entered By: Donnamarie Poag on 12/02/2020  12:01:43 Rodney Oneal ([id number]) -------------------------------------------------------------------------------- Wound Assessment Details Patient Name: Rodney, Oneal. Date of Service: 12/02/2020 11:15 AM Medical Record Number: [id number] Patient Account Number: [id number] Date of Birth/Sex: 1956/05/11 (65 y.o. M) Treating RN: Donnamarie Poag Primary Care Rodel Glaspy: Gaetano Net Other Clinician: Referring [place]: Gaetano Net Treating Shulamit Donofrio/Extender: Yaakov Guthrie in Treatment: 4 Wound Status Wound Number: 1 Primary Trauma, Other Etiology: Wound Location: Left, Medial Lower Leg Wound Open Wounding Event: Skin Tear/Laceration Status: Date Acquired: 10/21/2020 Comorbid Cataracts, Sleep Apnea, Arrhythmia, Coronary Artery Weeks Of Treatment: 4 History: Disease, Hypertension, Type II Diabetes, Osteoarthritis Clustered Wound: No Photos Wound Measurements Length: (cm) 2.7 Width: (cm) 1.5 Depth: (cm) 0.8 Area: (cm) 3.181 Volume: (cm) 2.545 % Reduction in Area: -8% % Reduction in Volume: -762.7% Epithelialization: None Tunneling: No Undermining: Yes Starting Position (o'clock): 12 Ending Position (o'clock): 3 Maximum Distance: (cm) 0.8 Wound Description Classification: Full Thickness Without Exposed Support Structu Exudate Amount: Large Exudate Type: Sanguinous Exudate Color: red res Foul Odor After Cleansing: No Slough/Fibrino Yes Wound Bed Granulation Amount: Large (67-100%) Exposed Structure Granulation Quality: Red, Pink Fascia Exposed: No Necrotic Amount: Small (1-33%) Fat Layer (Subcutaneous Tissue) Exposed: Yes Necrotic Quality: Adherent Slough Tendon Exposed: No Muscle Exposed: No Joint Exposed: No Bone Exposed: No Treatment Notes Wound #1 (Lower Leg) Wound Laterality: Left, Medial Cleanser Soap and Water Rodney, Oneal ([id number]) Discharge Instruction: Gently cleanse wound with antibacterial soap, rinse and pat dry prior to dressing  wounds Peri-Wound Care Topical Santyl Collagenase Ointment, 30 (gm), tube Discharge Instruction: Apply nickel thick to wound bed only Primary Dressing Gauze Discharge Instruction: Dry gauze as bolster on top on hydrofera Hydrofera Blue Ready Transfer Foam, 2.5x2.5 (in/in) Discharge Instruction: Cut Hydrofera Blue Ready, pack in undermining and apply to wound bed Secondary Dressing Xtrasorb Medium 4x5 (in/in) Discharge Instruction: Apply to wound as directed. Do not cut. Secured With Compression Wrap Medichoice 4 layer Compression System, 35-40 mmHG Discharge Instruction: Apply multi-layer wrap as directed. Compression Stockings Add-Ons Electronic Signature(s) Signed: 12/02/2020 4:25:48 PM By: Donnamarie Poag Entered By: Donnamarie Poag on 12/02/2020 11:59:38 Rodney Oneal ([id number]) -------------------------------------------------------------------------------- [place] Details Patient Name: Rodney Oneal. Date of Service: 12/02/2020 11:15 AM Medical Record Number: [id number] Patient Account Number: [id number] Date of Birth/Sex: [birth year] (65 y.o. M) Treating RN: Donnamarie Poag Primary Care Royal [place]: Gaetano Net Other Clinician: Referring Kaiden Dardis: Gaetano Net Treating Shavonne Ambroise/Extender: Yaakov Guthrie in Treatment: 4 Vital Signs Time Taken: 11:50 Temperature (F): 98.4 Height (in): 71 Pulse (bpm): 87 Weight (lbs): 311 Respiratory Rate (breaths/min): 18 Body Mass Index (BMI): 43.4 Blood Pressure (mmHg): 160/70 Reference Range: 80 - 120 mg / dl Electronic Signature(s) Signed: 12/02/2020 4:25:48 PM By: Donnamarie Poag Entered ByDonnamarie Poag on 12/02/2020 11:56:51

## 2020-12-09 ENCOUNTER — Other Ambulatory Visit: Payer: Self-pay

## 2020-12-09 ENCOUNTER — Encounter (HOSPITAL_BASED_OUTPATIENT_CLINIC_OR_DEPARTMENT_OTHER): Payer: Medicare Other | Admitting: Internal Medicine

## 2020-12-09 DIAGNOSIS — E11622 Type 2 diabetes mellitus with other skin ulcer: Secondary | ICD-10-CM | POA: Diagnosis not present

## 2020-12-09 DIAGNOSIS — L97828 Non-pressure chronic ulcer of other part of left lower leg with other specified severity: Secondary | ICD-10-CM | POA: Diagnosis not present

## 2020-12-09 DIAGNOSIS — S8012XD Contusion of left lower leg, subsequent encounter: Secondary | ICD-10-CM | POA: Diagnosis not present

## 2020-12-09 DIAGNOSIS — T45515S Adverse effect of anticoagulants, sequela: Secondary | ICD-10-CM

## 2020-12-09 NOTE — Progress Notes (Addendum)
Rodney Oneal, Rodney Oneal ([id number]) Visit Report for 12/09/2020 Arrival Information Details Patient Name: Rodney Oneal, Rodney Oneal. Date of Service: 12/09/2020 11:15 AM Medical Record Number: [id number] Patient Account Number: [id number] Date of Birth/Sex: 06/03/1955 (65 y.o. M) Treating RN: Dolan Amen Primary Care Jaylena Holloway: Gaetano Net Other Clinician: Referring Festus Pursel: Gaetano Net Treating Kechia Yahnke/Extender: Yaakov Guthrie in Treatment: 5 Visit Information History Since Last Visit Has Compression in Place as Prescribed: Yes Patient Arrived: Ambulatory Pain Present Now: No Arrival Time: 11:27 Accompanied By: wife Transfer Assistance: None Patient Identification Verified: Yes Secondary Verification Process Completed: Yes Patient Requires Transmission-Based No Precautions: Patient Has Alerts: Yes Patient Alerts: Patient on Blood Thinner DIABETIC COUMADIN Electronic Signature(s) Signed: 12/09/2020 4:41:30 PM By: Dolan Amen RN Entered By: Dolan Amen on 12/09/2020 11:27:54 Rodney Oneal ([id number]) -------------------------------------------------------------------------------- Clinic Level of Care Assessment Details Patient Name: Rodney Oneal. Date of Service: 12/09/2020 11:15 AM Medical Record Number: [id number] Patient Account Number: [id number] Date of Birth/Sex: August 07, 1955 (65 y.o. M) Treating RN: Dolan Amen Primary Care Antionio Negron: Gaetano Net Other Clinician: Referring Zayda Angell: Gaetano Net Treating Syndey Jaskolski/Extender: Yaakov Guthrie in Treatment: 5 Clinic Level of Care Assessment Items TOOL 1 Quantity Score []  - Use when EandM and Procedure is performed on INITIAL visit 0 ASSESSMENTS - Nursing Assessment / Reassessment []  - General Physical Exam (combine w/ comprehensive assessment (listed just below) when performed on new 0 pt. evals) []  - 0 Comprehensive Assessment (HX, ROS, Risk Assessments, Wounds Hx, etc.) ASSESSMENTS - Wound and Skin  Assessment / Reassessment []  - Dermatologic / Skin Assessment (not related to wound area) 0 ASSESSMENTS - Ostomy and/or Continence Assessment and Care []  - Incontinence Assessment and Management 0 []  - 0 Ostomy Care Assessment and Management (repouching, etc.) PROCESS - Coordination of Care []  - Simple Patient / Family Education for ongoing care 0 []  - 0 Complex (extensive) Patient / Family Education for ongoing care []  - 0 Staff obtains Programmer, systems, Records, Test Results / Process Orders []  - 0 Staff telephones HHA, Nursing Homes / Clarify orders / etc []  - 0 Routine Transfer to another Facility (non-emergent condition) []  - 0 Routine Hospital Admission (non-emergent condition) []  - 0 New Admissions / Biomedical engineer / Ordering NPWT, Apligraf, etc. []  - 0 Emergency Hospital Admission (emergent condition) PROCESS - Special Needs []  - Pediatric / Minor Patient Management 0 []  - 0 Isolation Patient Management []  - 0 Hearing / Language / Visual special needs []  - 0 Assessment of Community assistance (transportation, D/C planning, etc.) []  - 0 Additional assistance / Altered mentation []  - 0 Support Surface(s) Assessment (bed, cushion, seat, etc.) INTERVENTIONS - Miscellaneous []  - External ear exam 0 []  - 0 Patient Transfer (multiple staff / Civil Service fast streamer / Similar devices) []  - 0 Simple Staple / Suture removal (25 or less) []  - 0 Complex Staple / Suture removal (26 or more) []  - 0 Hypo/Hyperglycemic Management (do not check if billed separately) []  - 0 Ankle / Brachial Index (ABI) - do not check if billed separately Has the patient been seen at the hospital within the last three years: Yes Total Score: 0 Level Of Care: ____ Rodney Oneal ([id number]) Electronic Signature(s) Signed: 12/09/2020 4:41:30 PM By: Dolan Amen RN Entered By: Dolan Amen on 12/09/2020 11:53:00 Rodney Oneal  ([id number]) -------------------------------------------------------------------------------- Compression Therapy Details Patient Name: Rodney Oneal. Date of Service: 12/09/2020 11:15 AM Medical Record Number: [id number] Patient Account Number: [id number] Date of Birth/Sex: 1956-01-21 (65 y.o. M) Treating RN:  Dolan Amen Primary Care Nayzeth Altman: Gaetano Net Other Clinician: Referring Analeigh Aries: Gaetano Net Treating Iysha Mishkin/Extender: Yaakov Guthrie in Treatment: 5 Compression Therapy Performed for Wound Assessment: Wound #1 Left,Medial Lower Leg Performed By: Cora Daniels, RN Compression Type: Four Layer Pre Treatment ABI: 0.9 Post Procedure Diagnosis Same as Pre-procedure Electronic Signature(s) Signed: 12/09/2020 4:41:30 PM By: Dolan Amen RN Entered By: Dolan Amen on 12/09/2020 11:52:03 Rodney Oneal ([id number]) -------------------------------------------------------------------------------- Lower Extremity Assessment Details Patient Name: Rodney Oneal, Rodney Oneal. Date of Service: 12/09/2020 11:15 AM Medical Record Number: [id number] Patient Account Number: [id number] Date of Birth/Sex: 03-Sep-1955 (65 y.o. M) Treating RN: Dolan Amen Primary Care Katieann Hungate: Gaetano Net Other Clinician: Referring Maxine Huynh: Gaetano Net Treating Aleighna Wojtas/Extender: Yaakov Guthrie in Treatment: 5 Edema Assessment Assessed: Shirlyn Goltz: Yes] Patrice Paradise: No] Edema: [Left: Ye] [Right: s] Calf Left: Right: Point of Measurement: 34 cm From Medial Instep 37.5 cm Ankle Left: Right: Point of Measurement: 10 cm From Medial Instep 24 cm Vascular Assessment Pulses: Dorsalis Pedis Palpable: [Left:Yes] Electronic Signature(s) Signed: 12/09/2020 4:41:30 PM By: Dolan Amen RN Entered By: Dolan Amen on 12/09/2020 11:39:34 Rodney Oneal ([id number]) -------------------------------------------------------------------------------- Multi Wound Chart Details Patient  Name: Rodney Oneal. Date of Service: 12/09/2020 11:15 AM Medical Record Number: [id number] Patient Account Number: [id number] Date of Birth/Sex: 21-Aug-1955 (65 y.o. M) Treating RN: Dolan Amen Primary Care Lexee Brashears: Gaetano Net Other Clinician: Referring Patsey Pitstick: Gaetano Net Treating Rayfield Beem/Extender: Yaakov Guthrie in Treatment: 5 Vital Signs Height(in): 71 Pulse(bpm): 103 Weight(lbs): 311 Blood Pressure(mmHg): 158/90 Body Mass Index(BMI): 43 Temperature(F): 98.2 Respiratory Rate(breaths/min): 18 Photos: [N/A:N/A] Wound Location: Left, Medial Lower Leg N/A N/A Wounding Event: Skin Tear/Laceration N/A N/A Primary Etiology: Trauma, Other N/A N/A Comorbid History: Cataracts, Sleep Apnea, N/A N/A Arrhythmia, Coronary Artery Disease, Hypertension, Type II Diabetes, Osteoarthritis Date Acquired: 10/21/2020 N/A N/A Weeks of Treatment: 5 N/A N/A Wound Status: Open N/A N/A Measurements L x W x D (cm) 2.5x1.5x0.7 N/A N/A Area (cm) : 2.945 N/A N/A Volume (cm) : 2.062 N/A N/A % Reduction in Area: 0.00% N/A N/A % Reduction in Volume: -599.00% N/A N/A Classification: Full Thickness Without Exposed N/A N/A Support Structures Exudate Amount: Large N/A N/A Exudate Type: Sanguinous N/A N/A Exudate Color: red N/A N/A Granulation Amount: Large (67-100%) N/A N/A Granulation Quality: Red, Pink N/A N/A Necrotic Amount: None Present (0%) N/A N/A Exposed Structures: Fat Layer (Subcutaneous Tissue): N/A N/A Yes Fascia: No Tendon: No Muscle: No Joint: No Bone: No Epithelialization: None N/A N/A Procedures Performed: Compression Therapy N/A N/A Treatment Notes Electronic Signature(s) Signed: 12/09/2020 2:20:53 PM By: Kalman Shan DO Entered By: Kalman Shan on 12/09/2020 14:10:31 Rodney Oneal ([id number]) -------------------------------------------------------------------------------- [place] Details Patient Name: Rodney Oneal, Rodney Oneal. Date  of Service: 12/09/2020 11:15 AM Medical Record Number: [id number] Patient Account Number: [id number] Date of Birth/Sex: [birth year] (65 y.o. M) Treating RN: Dolan Amen Primary Care Vitaliy Eisenhour: Gaetano Net Other Clinician: Referring Toddrick Sanna: Gaetano Net Treating Maleiyah Releford/Extender: Yaakov Guthrie in Treatment: 5 Active Inactive Wound/Skin Impairment Nursing Diagnoses: Impaired tissue integrity Goals: Patient/caregiver will verbalize understanding of skin care regimen Date Initiated: 11/04/2020 Date Inactivated: 12/02/2020 Target Resolution Date: 11/04/2020 Goal Status: Met Ulcer/skin breakdown will have a volume reduction of 30% by week 4 Date Initiated: 11/04/2020 Target Resolution Date: 12/04/2020 Goal Status: Active Ulcer/skin breakdown will have a volume reduction of 50% by week 8 Date Initiated: 11/04/2020 Target Resolution Date: 01/04/2021 Goal Status: Active Ulcer/skin breakdown will have a volume reduction of 80% by week 12 Date  Initiated: 11/04/2020 Target Resolution Date: 02/04/2021 Goal Status: Active Ulcer/skin breakdown will heal within 14 weeks Date Initiated: 11/04/2020 Target Resolution Date: 03/06/2021 Goal Status: Active Interventions: Assess patient/caregiver ability to obtain necessary supplies Assess patient/caregiver ability to perform ulcer/skin care regimen upon admission and as needed Assess ulceration(s) every visit Provide education on ulcer and skin care Treatment Activities: Referred to DME Corney Knighton for dressing supplies : 11/04/2020 Skin care regimen initiated : 11/04/2020 Notes: Electronic Signature(s) Signed: 12/09/2020 4:41:30 PM By: Dolan Amen RN Entered By: Dolan Amen on 12/09/2020 11:51:30 Rodney Oneal ([id number]) -------------------------------------------------------------------------------- Pain Assessment Details Patient Name: Rodney Oneal. Date of Service: 12/09/2020 11:15 AM Medical Record Number: [id number] Patient Account  Number: [id number] Date of Birth/Sex: [birth year] (65 y.o. M) Treating RN: Dolan Amen Primary Care Brynne Doane: Gaetano Net Other Clinician: Referring Santonio Speakman: Gaetano Net Treating Chesni Vos/Extender: Yaakov Guthrie in Treatment: 5 Active Problems Location of Pain Severity and Description of Pain Patient Has Paino No Site Locations Rate the pain. Current Pain Level: 0 Pain Management and Medication Current Pain Management: Electronic Signature(s) Signed: 12/09/2020 4:41:30 PM By: Dolan Amen RN Entered By: Dolan Amen on 12/09/2020 11:29:15 Rodney Oneal ([id number]) -------------------------------------------------------------------------------- Patient/Caregiver Education Details Patient Name: Rodney Oneal. Date of Service: 12/09/2020 11:15 AM Medical Record Number: [id number] Patient Account Number: [id number] Date of Birth/Gender: 1955-10-06 (65 y.o. M) Treating RN: Dolan Amen Primary Care Physician: Gaetano Net Other Clinician: Referring Physician: Gaetano Net Treating Physician/Extender: Yaakov Guthrie in Treatment: 5 Education Assessment Education Provided To: Patient Education Topics Provided Wound/Skin Impairment: Methods: Explain/Verbal Responses: State content correctly Electronic Signature(s) Signed: 12/09/2020 4:41:30 PM By: Dolan Amen RN Entered By: Dolan Amen on 12/09/2020 11:53:33 Rodney Oneal ([id number]) -------------------------------------------------------------------------------- Wound Assessment Details Patient Name: Rodney Oneal, Rodney Oneal. Date of Service: 12/09/2020 11:15 AM Medical Record Number: [id number] Patient Account Number: [id number] Date of Birth/Sex: 1956/03/02 (65 y.o. M) Treating RN: Dolan Amen Primary Care Cynethia Schindler: Gaetano Net Other Clinician: Referring Mayvis Agudelo: Gaetano Net Treating Cedar Ditullio/Extender: Yaakov Guthrie in Treatment: 5 Wound Status Wound Number: 1 Primary  Trauma, Other Etiology: Wound Location: Left, Medial Lower Leg Wound Open Wounding Event: Skin Tear/Laceration Status: Date Acquired: 10/21/2020 Comorbid Cataracts, Sleep Apnea, Arrhythmia, Coronary Artery Weeks Of Treatment: 5 History: Disease, Hypertension, Type II Diabetes, Osteoarthritis Clustered Wound: No Photos Wound Measurements Length: (cm) 2.5 Width: (cm) 1.5 Depth: (cm) 0.7 Area: (cm) 2.945 Volume: (cm) 2.062 % Reduction in Area: 0% % Reduction in Volume: -599% Epithelialization: None Tunneling: No Undermining: No Wound Description Classification: Full Thickness Without Exposed Support Structures Exudate Amount: Large Exudate Type: Sanguinous Exudate Color: red Foul Odor After Cleansing: No Slough/Fibrino Yes Wound Bed Granulation Amount: Large (67-100%) Exposed Structure Granulation Quality: Red, Pink Fascia Exposed: No Necrotic Amount: None Present (0%) Fat Layer (Subcutaneous Tissue) Exposed: Yes Tendon Exposed: No Muscle Exposed: No Joint Exposed: No Bone Exposed: No Electronic Signature(s) Signed: 12/09/2020 4:41:30 PM By: Dolan Amen RN Entered By: Dolan Amen on 12/09/2020 11:38:19 Rodney Oneal ([id number]) -------------------------------------------------------------------------------- Vitals Details Patient Name: Rodney Oneal. Date of Service: 12/09/2020 11:15 AM Medical Record Number: [id number] Patient Account Number: [id number] Date of Birth/Sex: 04/17/1956 (65 y.o. M) Treating RN: Dolan Amen Primary Care Shila Kruczek: Gaetano Net Other Clinician: Referring Luisenrique Conran: Gaetano Net Treating Macyn Remmert/Extender: Yaakov Guthrie in Treatment: 5 Vital Signs Time Taken: 11:27 Temperature (F): 98.2 Height (in): 71 Pulse (bpm): 103 Weight (lbs): 311 Respiratory Rate (breaths/min): 18 Body Mass Index (BMI): 43.4 Blood Pressure (mmHg):  158/90 Reference Range: 80 - 120 mg / dl Electronic Signature(s) Signed: 12/09/2020  4:41:30 PM By: Dolan Amen RN Entered By: Dolan Amen on 12/09/2020 11:29:04

## 2020-12-10 NOTE — Progress Notes (Addendum)
Rodney, Oneal ([id number]) Visit Report for 12/09/2020 Chief Complaint Document Details Patient Name: Rodney Oneal, Rodney Oneal. Date of Service: 12/09/2020 11:15 AM Medical Record Number: [id number] Patient Account Number: [id number] Date of Birth/Sex: January 17, 1956 (65 y.o. M) Treating RN: Huel Coventry Primary Care Provider: Lenon Oms Other Clinician: Referring Provider: Lenon Oms Treating Provider/Extender: Tilda Franco in Treatment: 5 Information Obtained from: Patient Chief Complaint 11/04/2020; patient is here for review of a hematoma on his left lower leg Electronic Signature(s) Signed: 12/09/2020 2:20:53 PM By: Geralyn Corwin DO Entered By: Geralyn Corwin on 12/09/2020 14:10:52 Rodney Oneal ([id number]) -------------------------------------------------------------------------------- HPI Details Patient Name: Rodney Oneal. Date of Service: 12/09/2020 11:15 AM Medical Record Number: [id number] Patient Account Number: [id number] Date of Birth/Sex: July 20, 1955 (65 y.o. M) Treating RN: Huel Coventry Primary Care Provider: Lenon Oms Other Clinician: Referring Provider: Lenon Oms Treating Provider/Extender: Tilda Franco in Treatment: 5 History of Present Illness HPI Description: ADMISSION 11/04/2020 This is a 65 year old man who is on chronic Coumadin secondary to atrial fibrillation. He had a fall on 5/25 out of bed but he managed to hit his anterior left leg on a tool that was lying in the vicinity. He was seen at his primary doctor's office on 10/30/2020 and there was a concern for cellulitis he was put on cephalexin 500 3 times daily that he still taking. He currently has a significant swelling with some nonviable skin on top of it. He says this is begin leaking but it is not frankly open. He is not complaining of a lot of pain. Past medical history includes type 2 diabetes, chronic A. fib on Coumadin, hypertension, history of a CVA ABI in this clinic was 0.92  on the left 6/15 left medial lower leg hematoma secondary to trauma on Coumadin. The patient arrives back in clinic with a week of 4-layer compression with calcium alginate with the area on his left medial lower leg just above the ankle looking completely the same. No evidence that this is reabsorbing. Still very tight. I do not see any evidence of infection however I was hoping for some improvement in the amount of periwound swelling. 6/22; left medial lower leg hematoma secondary to trauma in a patient on chronic Coumadin. Last week I made the decision to open this. He had quite a bit of undermining in the deep wound. We use silver alginate. Really dramatic improvement in the undermining although the wound is still deep. 6/29; patient presents for 1 week follow-up. He has been using Hydrofera Blue under 4-layer compression and tolerating this well. He denies signs of infection. 7/6; patient presents for 1 week follow-up. He has been using Hydrofera Blue under 4-layer compression and tolerating this well. He denies signs of infection. He has no issues or complaints today. 7/13; Patient presents for 1 week follow-up. He has been using Hydrofera Blue under 4-layer compression and doing well with this. He denies signs of infection. He has no issues or complaints today. Electronic Signature(s) Signed: 12/09/2020 2:20:53 PM By: Geralyn Corwin DO Entered By: Geralyn Corwin on 12/09/2020 14:15:36 Rodney Oneal ([id number]) -------------------------------------------------------------------------------- Physical Exam Details Patient Name: Rodney, Oneal. Date of Service: 12/09/2020 11:15 AM Medical Record Number: [id number] Patient Account Number: [id number] Date of Birth/Sex: [birth year] (65 y.o. M) Treating RN: Huel Coventry Primary Care Provider: Lenon Oms Other Clinician: Referring Provider: Lenon Oms Treating Provider/Extender: Tilda Franco in Treatment:  5 Constitutional . Psychiatric . Notes Left lower extremity: On the anterior aspect there is a open  wound with granulation tissue present. No evidence of infection. Electronic Signature(s) Signed: 12/09/2020 2:20:53 PM By: Geralyn Corwin DO Entered By: Geralyn Corwin on 12/09/2020 14:18:11 Rodney Oneal ([id number]) -------------------------------------------------------------------------------- Physician Orders Details Patient Name: Rodney, Oneal. Date of Service: 12/09/2020 11:15 AM Medical Record Number: [id number] Patient Account Number: [id number] Date of Birth/Sex: 1956-03-19 (65 y.o. M) Treating RN: Rogers Blocker Primary Care Provider: Lenon Oms Other Clinician: Referring Provider: Lenon Oms Treating Provider/Extender: Tilda Franco in Treatment: 5 Verbal / Phone Orders: No Diagnosis Coding Follow-up Appointments o Return Appointment in 1 week. o Nurse Visit as needed Bathing/ Shower/ Hygiene o May shower with wound dressing protected with water repellent cover or cast protector. o No tub bath. Edema Control - Lymphedema / Segmental Compressive Device / Other o Optional: One layer of unna paste to top of compression wrap (to act as an anchor). Medications-Please add to medication list. o Other: - Tylenol for pain Wound Treatment Wound #1 - Lower Leg Wound Laterality: Left, Medial Cleanser: Soap and Water 1 x Per Week/15 Days Discharge Instructions: Gently cleanse wound with antibacterial soap, rinse and pat dry prior to dressing wounds Topical: Santyl Collagenase Ointment, 30 (gm), tube 1 x Per Week/15 Days Discharge Instructions: Apply nickel thick to wound bed only Primary Dressing: Gauze 1 x Per Week/15 Days Discharge Instructions: Dry gauze as bolster on top on hydrofera Primary Dressing: Hydrofera Blue Ready Transfer Foam, 2.5x2.5 (in/in) 1 x Per Week/15 Days Discharge Instructions: Cut Hydrofera Blue Ready, pack in undermining  and apply to wound bed Secondary Dressing: Xtrasorb Medium 4x5 (in/in) 1 x Per Week/15 Days Discharge Instructions: Apply to wound as directed. Do not cut. Compression Wrap: Medichoice 4 layer Compression System, 35-40 mmHG 1 x Per Week/15 Days Discharge Instructions: Apply multi-layer wrap as directed. Electronic Signature(s) Signed: 12/09/2020 2:20:53 PM By: Geralyn Corwin DO Signed: 12/09/2020 4:41:30 PM By: Rogers Blocker RN Entered By: Rogers Blocker on 12/09/2020 11:52:52 Rodney Oneal ([id number]) -------------------------------------------------------------------------------- Problem List Details Patient Name: ROMAN, DUBUC. Date of Service: 12/09/2020 11:15 AM Medical Record Number: [id number] Patient Account Number: [id number] Date of Birth/Sex: Nov 01, 1955 (65 y.o. M) Treating RN: Huel Coventry Primary Care Provider: Lenon Oms Other Clinician: Referring Provider: Lenon Oms Treating Provider/Extender: Tilda Franco in Treatment: 5 Active Problems ICD-10 Encounter Code Description Active Date MDM Diagnosis S80.12XD Contusion of left lower leg, subsequent encounter 11/04/2020 No Yes 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 11/04/2020 No Yes specified severity T45.515S Adverse effect of anticoagulants, sequela 11/04/2020 No Yes Inactive Problems Resolved Problems Electronic Signature(s) Signed: 12/09/2020 2:20:53 PM By: Geralyn Corwin DO Entered By: Geralyn Corwin on 12/09/2020 14:10:22 Rodney Oneal ([id number]) -------------------------------------------------------------------------------- Progress Note Details Patient Name: Rodney Oneal. Date of Service: 12/09/2020 11:15 AM Medical Record Number: [id number] Patient Account Number: [id number] Date of Birth/Sex: 1956/02/02 (65 y.o. M) Treating RN: Huel Coventry Primary Care Provider: Lenon Oms Other Clinician: Referring Provider: Lenon Oms Treating  Provider/Extender: Tilda Franco in Treatment: 5 Subjective Chief Complaint Information obtained from Patient 11/04/2020; patient is here for review of a hematoma on his left lower leg History of Present Illness (HPI) ADMISSION 11/04/2020 This is a 65 year old man who is on chronic Coumadin secondary to atrial fibrillation. He had a fall on 5/25 out of bed but he managed to hit his anterior left leg on a tool that was lying in the vicinity. He was seen at his primary doctor's office on 10/30/2020 and there  was a concern for cellulitis he was put on cephalexin 500 3 times daily that he still taking. He currently has a significant swelling with some nonviable skin on top of it. He says this is begin leaking but it is not frankly open. He is not complaining of a lot of pain. Past medical history includes type 2 diabetes, chronic A. fib on Coumadin, hypertension, history of a CVA ABI in this clinic was 0.92 on the left 6/15 left medial lower leg hematoma secondary to trauma on Coumadin. The patient arrives back in clinic with a week of 4-layer compression with calcium alginate with the area on his left medial lower leg just above the ankle looking completely the same. No evidence that this is reabsorbing. Still very tight. I do not see any evidence of infection however I was hoping for some improvement in the amount of periwound swelling. 6/22; left medial lower leg hematoma secondary to trauma in a patient on chronic Coumadin. Last week I made the decision to open this. He had quite a bit of undermining in the deep wound. We use silver alginate. Really dramatic improvement in the undermining although the wound is still deep. 6/29; patient presents for 1 week follow-up. He has been using Hydrofera Blue under 4-layer compression and tolerating this well. He denies signs of infection. 7/6; patient presents for 1 week follow-up. He has been using Hydrofera Blue under 4-layer compression and  tolerating this well. He denies signs of infection. He has no issues or complaints today. 7/13; Patient presents for 1 week follow-up. He has been using Hydrofera Blue under 4-layer compression and doing well with this. He denies signs of infection. He has no issues or complaints today. Patient History Information obtained from Patient. Social History Former smoker - ended on 05/30/2013, Marital Status - Married, Alcohol Use - Never, Drug Use - No History, Caffeine Use - Rarely. Medical History Eyes Patient has history of Cataracts - surgery Respiratory Patient has history of Sleep Apnea Cardiovascular Patient has history of Arrhythmia - afib, Coronary Artery Disease, Hypertension Endocrine Patient has history of Type II Diabetes Musculoskeletal Patient has history of Osteoarthritis Objective DONIE, LEMELIN. ([id number]) Constitutional Vitals Time Taken: 11:27 AM, Height: 71 in, Weight: 311 lbs, BMI: 43.4, Temperature: 98.2 F, Pulse: 103 bpm, Respiratory Rate: 18 breaths/min, Blood Pressure: 158/90 mmHg. General Notes: Left lower extremity: On the anterior aspect there is a open wound with granulation tissue present. No evidence of infection. Integumentary (Hair, Skin) Wound #1 status is Open. Original cause of wound was Skin Tear/Laceration. The date acquired was: 10/21/2020. The wound has been in treatment 5 weeks. The wound is located on the Left,Medial Lower Leg. The wound measures 2.5cm length x 1.5cm width x 0.7cm depth; 2.945cm^2 area and 2.062cm^3 volume. There is Fat Layer (Subcutaneous Tissue) exposed. There is no tunneling or undermining noted. There is a large amount of sanguinous drainage noted. There is large (67-100%) red, pink granulation within the wound bed. There is no necrotic tissue within the wound bed. Assessment Active Problems ICD-10 Contusion of left lower leg, subsequent encounter Non-pressure chronic ulcer of other part of left lower leg with other  specified severity Adverse effect of anticoagulants, sequela Patient's wound shows improvement in size and appearance since last clinic visit. No signs of infection on exam. We will continue with Santyl and Hydrofera Blue under 4-layer compression. Procedures Wound #1 Pre-procedure diagnosis of Wound #1 is a Trauma, Other located on the Left,Medial Lower Leg . There was  a Four Layer Compression Therapy Procedure with a pre-treatment ABI of 0.9 by Rogers Blocker, RN. Post procedure Diagnosis Wound #1: Same as Pre-Procedure Plan Follow-up Appointments: Return Appointment in 1 week. Nurse Visit as needed Bathing/ Shower/ Hygiene: May shower with wound dressing protected with water repellent cover or cast protector. No tub bath. Edema Control - Lymphedema / Segmental Compressive Device / Other: Optional: One layer of unna paste to top of compression wrap (to act as an anchor). Medications-Please add to medication list.: Other: - Tylenol for pain WOUND #1: - Lower Leg Wound Laterality: Left, Medial Cleanser: Soap and Water 1 x Per Week/15 Days Discharge Instructions: Gently cleanse wound with antibacterial soap, rinse and pat dry prior to dressing wounds Topical: Santyl Collagenase Ointment, 30 (gm), tube 1 x Per Week/15 Days Discharge Instructions: Apply nickel thick to wound bed only Primary Dressing: Gauze 1 x Per Week/15 Days Discharge Instructions: Dry gauze as bolster on top on hydrofera Primary Dressing: Hydrofera Blue Ready Transfer Foam, 2.5x2.5 (in/in) 1 x Per Week/15 Days Discharge Instructions: Cut Hydrofera Blue Ready, pack in undermining and apply to wound bed Secondary Dressing: Xtrasorb Medium 4x5 (in/in) 1 x Per Week/15 Days Discharge Instructions: Apply to wound as directed. Do not cut. Compression Wrap: Medichoice 4 layer Compression System, 35-40 mmHG 1 x Per Week/15 Days Discharge Instructions: Apply multi-layer wrap as directed. SHANARD, TRETO R. ([id number]) 1.  Hydrofera Blue, Santyl under 4-layer compression 2. Follow-up in 1 week Electronic Signature(s) Signed: 12/09/2020 2:20:53 PM By: Geralyn Corwin DO Entered By: Geralyn Corwin on 12/09/2020 14:19:07 Rodney Oneal ([id number]) -------------------------------------------------------------------------------- ROS/PFSH Details Patient Name: Rodney Oneal. Date of Service: 12/09/2020 11:15 AM Medical Record Number: [id number] Patient Account Number: [id number] Date of Birth/Sex: [birth year] (65 y.o. M) Treating RN: Huel Coventry Primary Care Provider: Lenon Oms Other Clinician: Referring Provider: Lenon Oms Treating Provider/Extender: Tilda Franco in Treatment: 5 Information Obtained From Patient Eyes Medical History: Positive for: Cataracts - surgery Respiratory Medical History: Positive for: Sleep Apnea Cardiovascular Medical History: Positive for: Arrhythmia - afib; Coronary Artery Disease; Hypertension Endocrine Medical History: Positive for: Type II Diabetes Time with diabetes: 2002 Treated with: Insulin, Oral agents Blood sugar tested every day: No Musculoskeletal Medical History: Positive for: Osteoarthritis HBO Extended History Items Eyes: Cataracts Immunizations Pneumococcal Vaccine: Received Pneumococcal Vaccination: Yes Implantable Devices None Family and Social History Former smoker - ended on 05/30/2013; Marital Status - Married; Alcohol Use: Never; Drug Use: No History; Caffeine Use: Rarely; Financial Concerns: No; Food, Clothing or Shelter Needs: No; Support System Lacking: No; Transportation Concerns: No Electronic Signature(s) Signed: 12/09/2020 2:20:53 PM By: Geralyn Corwin DO Signed: 12/10/2020 4:19:16 PM By: Elliot Gurney, BSN, RN, CWS, Kim RN, BSN Entered By: Geralyn Corwin on 12/09/2020 14:15:43 Rodney Oneal ([id number]) -------------------------------------------------------------------------------- SuperBill Details Patient Name:  EARLIN, SWEEDEN. Date of Service: 12/09/2020 Medical Record Number: [id number] Patient Account Number: [id number] Date of Birth/Sex: 03-07-1956 (65 y.o. 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 specified severity T45.515S Adverse effect of anticoagulants, sequela Facility Procedures CPT4 Code: [id number] Description: (Facility Use Only) 29581LT - APPLY MULTLAY COMPRS LWR LT LEG Modifier: Quantity: 1 Physician Procedures CPT4 Code: 5631497 Description: 99213 - WC PHYS LEVEL 3 - EST PT Modifier: Quantity: 1 CPT4 Code: Description:  ICD-10 Diagnosis Description S80.12XD Contusion of left lower leg, subsequent encounter 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 spe T45.515S Adverse effect of anticoagulants, sequela Modifier: cified severity Quantity: Electronic Signature(s) Signed: 12/09/2020 2:20:53 PM By: Geralyn CorwinHoffman, Carlyne Keehan DO Entered By: Geralyn CorwinHoffman, Saara Kijowski on 12/09/2020 14:20:10

## 2020-12-16 ENCOUNTER — Encounter (HOSPITAL_BASED_OUTPATIENT_CLINIC_OR_DEPARTMENT_OTHER): Payer: Medicare Other | Admitting: Internal Medicine

## 2020-12-16 ENCOUNTER — Other Ambulatory Visit: Payer: Self-pay

## 2020-12-16 DIAGNOSIS — L97828 Non-pressure chronic ulcer of other part of left lower leg with other specified severity: Secondary | ICD-10-CM

## 2020-12-16 DIAGNOSIS — E11622 Type 2 diabetes mellitus with other skin ulcer: Secondary | ICD-10-CM | POA: Diagnosis not present

## 2020-12-16 NOTE — Progress Notes (Addendum)
BRYAM, Oneal ([id number]) Visit Report for 12/16/2020 Arrival Information Details Patient Name: Rodney Oneal, Rodney Oneal. Date of Service: 12/16/2020 11:15 AM Medical Record Number: [id number] Patient Account Number: [id number] Date of Birth/Sex: 10/17/1955 (65 y.o. M) Treating RN: Donnamarie Poag Primary Care Lanasia Porras: Gaetano Net Other Clinician: Referring Donique Hammonds: Gaetano Net Treating Georgina Krist/Extender: Yaakov Guthrie in Treatment: 6 Visit Information History Since Last Visit Added or deleted any medications: No Patient Arrived: Ambulatory Had a fall or experienced change in No Arrival Time: 11:24 activities of daily living that may affect Accompanied By: wife risk of falls: Transfer Assistance: None Hospitalized since last visit: No Patient Identification Verified: Yes Has Dressing in Place as Prescribed: Yes Secondary Verification Process Completed: Yes Has Compression in Place as Prescribed: Yes Patient Requires Transmission-Based No Pain Present Now: Yes Precautions: Patient Has Alerts: Yes Patient Alerts: Patient on Blood Thinner DIABETIC COUMADIN Electronic Signature(s) Signed: 12/16/2020 11:43:39 AM By: Donnamarie Poag Entered By: Donnamarie Poag on 12/16/2020 11:25:01 Rodney Oneal ([id number]) -------------------------------------------------------------------------------- Clinic Level of Care Assessment Details Patient Name: Rodney Oneal. Date of Service: 12/16/2020 11:15 AM Medical Record Number: [id number] Patient Account Number: [id number] Date of Birth/Sex: 12-17-1955 (64 y.o. M) Treating RN: Donnamarie Poag Primary Care Naman Spychalski: Gaetano Net Other Clinician: Referring Joyceline Maiorino: Gaetano Net Treating Arieon Corcoran/Extender: Yaakov Guthrie in Treatment: 6 Clinic Level of Care Assessment Items TOOL 1 Quantity Score _0  - Use when EandM and Procedure is performed on INITIAL visit 0 ASSESSMENTS - Nursing Assessment / Reassessment _1  - General Physical Exam  (combine w/ comprehensive assessment (listed just below) when performed on new 0 pt. evals) _2  - 0 Comprehensive Assessment (HX, ROS, Risk Assessments, Wounds Hx, etc.) ASSESSMENTS - Wound and Skin Assessment / Reassessment _3  - Dermatologic / Skin Assessment (not related to wound area) 0 ASSESSMENTS - Ostomy and/or Continence Assessment and Care _4  - Incontinence Assessment and Management 0 _5  - 0 Ostomy Care Assessment and Management (repouching, etc.) PROCESS - Coordination of Care _6  - Simple Patient / Family Education for ongoing care 0 _7  - 0 Complex (extensive) Patient / Family Education for ongoing care _8  - 0 Staff obtains Programmer, systems, Records, Test Results / Process Orders _9  - 0 Staff telephones HHA, Nursing Homes / Clarify orders / etc _10  - 0 Routine Transfer to another Facility (non-emergent condition) _11  - 0 Routine Hospital Admission (non-emergent condition) _12  - 0 New Admissions / Biomedical engineer / Ordering NPWT, Apligraf, etc. _13  - 0 Emergency Hospital Admission (emergent condition) PROCESS - Special Needs _14  - Pediatric / Minor Patient Management 0 _15  - 0 Isolation Patient Management _16  - 0 Hearing / Language / Visual special needs _17  - 0 Assessment of Community assistance (transportation, D/C planning, etc.) _18  - 0 Additional assistance / Altered mentation _19  - 0 Support Surface(s) Assessment (bed, cushion, seat, etc.) INTERVENTIONS - Miscellaneous _20  - External ear exam 0 _21  - 0 Patient Transfer (multiple staff / Civil Service fast streamer / Similar devices) _22  - 0 Simple Staple / Suture removal (25 or less) _23  - 0 Complex Staple / Suture removal (26 or more) _24  - 0 Hypo/Hyperglycemic Management (do not check if billed separately) _25  - 0 Ankle / Brachial Index (ABI) - do not check if billed separately Has the patient been seen at the hospital within the last three years: Yes Total Score: 0 Level Of Care: ____ Rodney Oneal ([id number]) Electronic  Signature(s) Signed: 12/17/2020 10:03:03 AM By: Donnamarie Poag Entered By: Donnamarie Poag on 12/16/2020 12:07:39 Rodney Oneal ([id number]) -------------------------------------------------------------------------------- Compression  Therapy Details Patient Name: Rodney, Oneal. Date of Service: 12/16/2020 11:15 AM Medical Record Number: [id number] Patient Account Number: [id number] Date of Birth/Sex: 1955-08-04 (65 y.o. M) Treating RN: Donnamarie Poag Primary Care Rajveer Handler: Gaetano Net Other Clinician: Referring Haskell Rihn: Gaetano Net Treating Charmayne Odell/Extender: Yaakov Guthrie in Treatment: 6 Compression Therapy Performed for Wound Assessment: Wound #1 Left,Medial Lower Leg Performed By: Junius Argyle, RN Compression Type: Four Layer Post Procedure Diagnosis Same as Pre-procedure Electronic Signature(s) Signed: 12/17/2020 10:03:03 AM By: Donnamarie Poag Entered By: Donnamarie Poag on 12/16/2020 12:03:02 Rodney Oneal ([id number]) -------------------------------------------------------------------------------- Encounter Discharge Information Details Patient Name: Rodney Oneal. Date of Service: 12/16/2020 11:15 AM Medical Record Number: [id number] Patient Account Number: [id number] Date of Birth/Sex: 1955/12/14 (65 y.o. M) Treating RN: Donnamarie Poag Primary Care Sandhya Denherder: Gaetano Net Other Clinician: Referring Kaliope Quinonez: Gaetano Net Treating Elainah Rhyne/Extender: Yaakov Guthrie in Treatment: 6 Encounter Discharge Information Items Post Procedure Vitals Discharge Condition: Stable Temperature (F): 98.4 Ambulatory Status: Ambulatory Pulse (bpm): 78 Discharge Destination: Home Respiratory Rate (breaths/min): 18 Transportation: Private Auto Blood Pressure (mmHg): 113/78 Accompanied By: wife Schedule Follow-up Appointment: Yes Clinical Summary of Care: Electronic Signature(s) Signed: 12/17/2020 10:03:03 AM By: Donnamarie Poag Entered By: Donnamarie Poag on 12/16/2020  12:17:10 Rodney Oneal ([id number]) -------------------------------------------------------------------------------- Lower Extremity Assessment Details Patient Name: Rodney Oneal. Date of Service: 12/16/2020 11:15 AM Medical Record Number: [id number] Patient Account Number: [id number] Date of Birth/Sex: 1956-02-18 (65 y.o. M) Treating RN: Donnamarie Poag Primary Care Jamilia Jacques: Gaetano Net Other Clinician: Referring Terryl Niziolek: Gaetano Net Treating Andree Golphin/Extender: Yaakov Guthrie in Treatment: 6 Edema Assessment Assessed: Shirlyn Goltz: Yes] Patrice Paradise: No] [Left: Edema] [Right: :] Calf Left: Right: Point of Measurement: 34 cm From Medial Instep 37.5 cm Ankle Left: Right: Point of Measurement: 10 cm From Medial Instep 23 cm Knee To Floor Left: Right: From Medial Instep 42 cm Vascular Assessment Pulses: Dorsalis Pedis Palpable: [Left:Yes] Electronic Signature(s) Signed: 12/16/2020 11:43:39 AM By: Donnamarie Poag Entered By: Donnamarie Poag on 12/16/2020 11:32:04 Rodney Oneal ([id number]) -------------------------------------------------------------------------------- Multi Wound Chart Details Patient Name: Rodney Oneal. Date of Service: 12/16/2020 11:15 AM Medical Record Number: [id number] Patient Account Number: [id number] Date of Birth/Sex: [birth year] (65 y.o. M) Treating RN: Donnamarie Poag Primary Care Orpah Hausner: Gaetano Net Other Clinician: Referring Anet Logsdon: Gaetano Net Treating Hayleen Clinkscales/Extender: Yaakov Guthrie in Treatment: 6 Vital Signs Height(in): 56 Pulse(bpm): 34 Weight(lbs): 311 Blood Pressure(mmHg): 131/84 Body Mass Index(BMI): 43 Temperature(F): 98.2 Respiratory Rate(breaths/min): 18 Photos: [N/A:N/A] Wound Location: Left, Medial Lower Leg N/A N/A Wounding Event: Skin Tear/Laceration N/A N/A Primary Etiology: Trauma, Other N/A N/A Comorbid History: Cataracts, Sleep Apnea, N/A N/A Arrhythmia, Coronary Artery Disease, Hypertension, Type  II Diabetes, Osteoarthritis Date Acquired: 10/21/2020 N/A N/A Weeks of Treatment: 6 N/A N/A Wound Status: Open N/A N/A Measurements L x W x D (cm) 1.9x1.4x0.4 N/A N/A Area (cm) : 2.089 N/A N/A Volume (cm) : 0.836 N/A N/A % Reduction in Area: 29.10% N/A N/A % Reduction in Volume: -183.40% N/A N/A Classification: Full Thickness Without Exposed N/A N/A Support Structures Exudate Amount: Medium N/A N/A Exudate Type: Serosanguineous N/A N/A Exudate Color: red, brown N/A N/A Granulation Amount: Large (67-100%) N/A N/A Granulation Quality: Red, Pink N/A N/A Necrotic Amount: None Present (0%) N/A N/A Exposed Structures: Fat Layer (Subcutaneous Tissue): N/A N/A Yes Fascia: No Tendon: No Muscle: No Joint: No Bone: No Epithelialization: None N/A N/A Debridement: Debridement - Excisional N/A N/A Pre-procedure Verification/Time 12:05 N/A N/A Out Taken: Pain Control: Lidocaine N/A N/A Tissue Debrided: Subcutaneous, Slough N/A  N/A Level: Skin/Subcutaneous Tissue N/A N/A Debridement Area (sq cm): 2.66 N/A N/A Instrument: Curette N/A N/A Bleeding: Moderate N/A N/A Hemostasis Achieved: Gel Foam N/A N/A Debridement Treatment Procedure was tolerated well N/A N/A Response: SHAUNAK, KREIS ([id number]) Post Debridement 1.9x1.4x0.4 N/A N/A Measurements L x W x D (cm) Post Debridement Volume: 0.836 N/A N/A (cm) Procedures Performed: Compression Therapy N/A N/A Debridement Treatment Notes Wound #1 (Lower Leg) Wound Laterality: Left, Medial Cleanser Soap and Water Discharge Instruction: Gently cleanse wound with antibacterial soap, rinse and pat dry prior to dressing wounds Peri-Wound Care Topical Santyl Collagenase Ointment, 30 (gm), tube Discharge Instruction: Apply nickel thick to wound bed only Primary Dressing Gauze Discharge Instruction: Dry gauze as bolster on top on hydrofera Hydrofera Blue Ready Transfer Foam, 2.5x2.5 (in/in) Discharge Instruction: Cut Hydrofera Blue  Ready, pack in undermining and apply to wound bed Secondary Dressing Xtrasorb Medium 4x5 (in/in) Discharge Instruction: Apply to wound as directed. Do not cut. Secured With Compression Wrap Medichoice 4 layer Compression System, 35-40 mmHG Discharge Instruction: Apply multi-layer wrap as directed. Compression Stockings Add-Ons Electronic Signature(s) Signed: 12/16/2020 12:36:08 PM By: Kalman Shan DO Previous Signature: 12/16/2020 11:43:39 AM Version By: Donnamarie Poag Entered By: Kalman Shan on 12/16/2020 12:31:09 Rodney Oneal ([id number]) -------------------------------------------------------------------------------- [place] Details Patient Name: JOSEH, SJOGREN. Date of Service: 12/16/2020 11:15 AM Medical Record Number: [id number] Patient Account Number: [id number] Date of Birth/Sex: 1955-12-04 (65 y.o. M) Treating RN: Donnamarie Poag Primary Care Nazirah Tri: Gaetano Net Other Clinician: Referring La Dibella: Gaetano Net Treating Oliwia Berzins/Extender: Yaakov Guthrie in Treatment: 6 Active Inactive Wound/Skin Impairment Nursing Diagnoses: Impaired tissue integrity Goals: Patient/caregiver will verbalize understanding of skin care regimen Date Initiated: 11/04/2020 Date Inactivated: 12/02/2020 Target Resolution Date: 11/04/2020 Goal Status: Met Ulcer/skin breakdown will have a volume reduction of 30% by week 4 Date Initiated: 11/04/2020 Date Inactivated: 12/16/2020 Target Resolution Date: 12/04/2020 Goal Status: Met Ulcer/skin breakdown will have a volume reduction of 50% by week 8 Date Initiated: 11/04/2020 Target Resolution Date: 01/04/2021 Goal Status: Active Ulcer/skin breakdown will have a volume reduction of 80% by week 12 Date Initiated: 11/04/2020 Target Resolution Date: 02/04/2021 Goal Status: Active Ulcer/skin breakdown will heal within 14 weeks Date Initiated: 11/04/2020 Target Resolution Date: 03/06/2021 Goal Status: Active Interventions: Assess  patient/caregiver ability to obtain necessary supplies Assess patient/caregiver ability to perform ulcer/skin care regimen upon admission and as needed Assess ulceration(s) every visit Provide education on ulcer and skin care Treatment Activities: Referred to DME Darbie Biancardi for dressing supplies : 11/04/2020 Skin care regimen initiated : 11/04/2020 Notes: Electronic Signature(s) Signed: 12/16/2020 11:43:39 AM By: Donnamarie Poag Entered By: Donnamarie Poag on 12/16/2020 11:32:18 Rodney Oneal ([id number]) -------------------------------------------------------------------------------- Pain Assessment Details Patient Name: Rodney Oneal. Date of Service: 12/16/2020 11:15 AM Medical Record Number: [id number] Patient Account Number: [id number] Date of Birth/Sex: [birth year] (65 y.o. M) Treating RN: Donnamarie Poag Primary Care Hyun Reali: Gaetano Net Other Clinician: Referring Pressley Tadesse: Gaetano Net Treating Aylissa Heinemann/Extender: Yaakov Guthrie in Treatment: 6 Active Problems Location of Pain Severity and Description of Pain Patient Has Paino Yes Site Locations Rate the pain. Current Pain Level: 3 Pain Management and Medication Current Pain Management: Electronic Signature(s) Signed: 12/16/2020 11:43:39 AM By: Donnamarie Poag Entered By: Donnamarie Poag on 12/16/2020 11:25:38 Rodney Oneal ([id number]) -------------------------------------------------------------------------------- Patient/Caregiver Education Details Patient Name: Rodney Oneal. Date of Service: 12/16/2020 11:15 AM Medical Record Number: [id number] Patient Account Number: [id number] Date of Birth/Gender: 1956/02/03 (65 y.o. M) Treating RN: Donnamarie Poag Primary Care Physician: Gaetano Net  Other Clinician: Referring Physician: Gaetano Net Treating Physician/Extender: Yaakov Guthrie in Treatment: 6 Education Assessment Education Provided To: Patient Education Topics Provided Wound Debridement: Wound/Skin  Impairment: Electronic Signature(s) Signed: 12/17/2020 10:03:03 AM By: Donnamarie Poag Entered By: Donnamarie Poag on 12/16/2020 12:08:12 Rodney Oneal ([id number]) -------------------------------------------------------------------------------- Wound Assessment Details Patient Name: DUSTY, RACZKOWSKI. Date of Service: 12/16/2020 11:15 AM Medical Record Number: [id number] Patient Account Number: [id number] Date of Birth/Sex: February 15, 1956 (65 y.o. M) Treating RN: Donnamarie Poag Primary Care Dalylah Ramey: Gaetano Net Other Clinician: Referring Anjeli Casad: Gaetano Net Treating Keston Seever/Extender: Yaakov Guthrie in Treatment: 6 Wound Status Wound Number: 1 Primary Trauma, Other Etiology: Wound Location: Left, Medial Lower Leg Wound Open Wounding Event: Skin Tear/Laceration Status: Date Acquired: 10/21/2020 Comorbid Cataracts, Sleep Apnea, Arrhythmia, Coronary Artery Weeks Of Treatment: 6 History: Disease, Hypertension, Type II Diabetes, Osteoarthritis Clustered Wound: No Photos Wound Measurements Length: (cm) 1.9 Width: (cm) 1.4 Depth: (cm) 0.4 Area: (cm) 2.089 Volume: (cm) 0.836 % Reduction in Area: 29.1% % Reduction in Volume: -183.4% Epithelialization: None Tunneling: No Undermining: No Wound Description Classification: Full Thickness Without Exposed Support Structures Exudate Amount: Medium Exudate Type: Serosanguineous Exudate Color: red, brown Foul Odor After Cleansing: No Slough/Fibrino No Wound Bed Granulation Amount: Large (67-100%) Exposed Structure Granulation Quality: Red, Pink Fascia Exposed: No Necrotic Amount: None Present (0%) Fat Layer (Subcutaneous Tissue) Exposed: Yes Tendon Exposed: No Muscle Exposed: No Joint Exposed: No Bone Exposed: No Treatment Notes Wound #1 (Lower Leg) Wound Laterality: Left, Medial Cleanser Soap and Water Discharge Instruction: Gently cleanse wound with antibacterial soap, rinse and pat dry prior to dressing wounds Peri-Wound  Care BREYDON, SENTERS ([id number]) Topical Santyl Collagenase Ointment, 30 (gm), tube Discharge Instruction: Apply nickel thick to wound bed only Primary Dressing Gauze Discharge Instruction: Dry gauze as bolster on top on hydrofera Hydrofera Blue Ready Transfer Foam, 2.5x2.5 (in/in) Discharge Instruction: Cut Hydrofera Blue Ready, pack in undermining and apply to wound bed Secondary Dressing Xtrasorb Medium 4x5 (in/in) Discharge Instruction: Apply to wound as directed. Do not cut. Secured With Compression Wrap Medichoice 4 layer Compression System, 35-40 mmHG Discharge Instruction: Apply multi-layer wrap as directed. Compression Stockings Add-Ons Electronic Signature(s) Signed: 12/16/2020 11:43:39 AM By: Donnamarie Poag Entered By: Donnamarie Poag on 12/16/2020 11:30:46 Rodney Oneal ([id number]) -------------------------------------------------------------------------------- [place] Details Patient Name: Rodney Oneal. Date of Service: 12/16/2020 11:15 AM Medical Record Number: [id number] Patient Account Number: [id number] Date of Birth/Sex: 18-Sep-1955 (65 y.o. M) Treating RN: Donnamarie Poag Primary Care Demarcus Thielke: Gaetano Net Other Clinician: Referring Sharonne Ricketts: Gaetano Net Treating Aleyah Balik/Extender: Yaakov Guthrie in Treatment: 6 Vital Signs Time Taken: 11:27 Temperature (F): 98.2 Height (in): 71 Pulse (bpm): 75 Weight (lbs): 311 Respiratory Rate (breaths/min): 18 Body Mass Index (BMI): 43.4 Blood Pressure (mmHg): 131/84 Reference Range: 80 - 120 mg / dl Electronic Signature(s) Signed: 12/16/2020 11:43:39 AM By: Donnamarie Poag Entered ByDonnamarie Poag on 12/16/2020 11:25:28

## 2020-12-17 NOTE — Progress Notes (Signed)
Rodney, Oneal ([id number]) Visit Report for 12/16/2020 Chief Complaint Document Details Patient Name: Rodney Oneal, Rodney Oneal. Date of Service: 12/16/2020 11:15 AM Medical Record Number: [id number] Patient Account Number: [id number] Date of Birth/Sex: 12-Dec-1955 (65 y.o. M) Treating RN: Huel Coventry Primary Care Provider: Lenon Oms Other Clinician: Referring Provider: Lenon Oms Treating Provider/Extender: Tilda Franco in Treatment: 6 Information Obtained from: Patient Chief Complaint 11/04/2020; patient is here for review of a hematoma on his left lower leg Electronic Signature(s) Signed: 12/16/2020 12:36:08 PM By: Geralyn Corwin DO Entered By: Geralyn Corwin on 12/16/2020 12:31:19 Rodney Oneal ([id number]) -------------------------------------------------------------------------------- Debridement Details Patient Name: Rodney Oneal. Date of Service: 12/16/2020 11:15 AM Medical Record Number: [id number] Patient Account Number: [id number] Date of Birth/Sex: 02/21/1956 (65 y.o. M) Treating RN: Hansel Feinstein Primary Care Provider: Lenon Oms Other Clinician: Referring Provider: Lenon Oms Treating Provider/Extender: Tilda Franco in Treatment: 6 Debridement Performed for Wound #1 Left,Medial Lower Leg Assessment: Performed By: Physician Geralyn Corwin, MD Debridement Type: Debridement Level of Consciousness (Pre- Awake and Alert procedure): Pre-procedure Verification/Time Out Yes - 12:05 Taken: Start Time: 12:05 Pain Control: Lidocaine Total Area Debrided (L x W): 1.9 (cm) x 1.4 (cm) = 2.66 (cm) Tissue and other material Slough, Subcutaneous, Biofilm, Slough debrided: Level: Skin/Subcutaneous Tissue Debridement Description: Excisional Instrument: Curette Bleeding: Moderate Hemostasis Achieved: Gel Foam End Time: 12:07 Response to Treatment: Procedure was tolerated well Level of Consciousness (Post- Awake and Alert procedure): Post  Debridement Measurements of Total Wound Length: (cm) 1.9 Width: (cm) 1.4 Depth: (cm) 0.4 Volume: (cm) 0.836 Character of Wound/Ulcer Post Debridement: Improved Post Procedure Diagnosis Same as Pre-procedure Electronic Signature(s) Signed: 12/16/2020 12:36:08 PM By: Geralyn Corwin DO Signed: 12/17/2020 10:03:03 AM By: Hansel Feinstein Entered By: Hansel Feinstein on 12/16/2020 12:05:48 Rodney Oneal ([id number]) -------------------------------------------------------------------------------- HPI Details Patient Name: Rodney Oneal. Date of Service: 12/16/2020 11:15 AM Medical Record Number: [id number] Patient Account Number: [id number] Date of Birth/Sex: 08-26-1955 (65 y.o. M) Treating RN: Huel Coventry Primary Care Provider: Lenon Oms Other Clinician: Referring Provider: Lenon Oms Treating Provider/Extender: Tilda Franco in Treatment: 6 History of Present Illness HPI Description: ADMISSION 11/04/2020 This is a 65 year old man who is on chronic Coumadin secondary to atrial fibrillation. He had a fall on 5/25 out of bed but he managed to hit his anterior left leg on a tool that was lying in the vicinity. He was seen at his primary doctor's office on 10/30/2020 and there was a concern for cellulitis he was put on cephalexin 500 3 times daily that he still taking. He currently has a significant swelling with some nonviable skin on top of it. He says this is begin leaking but it is not frankly open. He is not complaining of a lot of pain. Past medical history includes type 2 diabetes, chronic A. fib on Coumadin, hypertension, history of a CVA ABI in this clinic was 0.92 on the left 6/15 left medial lower leg hematoma secondary to trauma on Coumadin. The patient arrives back in clinic with a week of 4-layer compression with calcium alginate with the area on his left medial lower leg just above the ankle looking completely the same. No evidence that this is reabsorbing. Still very  tight. I do not see any evidence of infection however I was hoping for some improvement in the amount of periwound swelling. 6/22; left medial lower leg hematoma secondary to trauma in a patient on chronic Coumadin. Last week I made the decision to open this. He had quite  a bit of undermining in the deep wound. We use silver alginate. Really dramatic improvement in the undermining although the wound is still deep. 6/29; patient presents for 1 week follow-up. He has been using Hydrofera Blue under 4-layer compression and tolerating this well. He denies signs of infection. 7/6; patient presents for 1 week follow-up. He has been using Hydrofera Blue under 4-layer compression and tolerating this well. He denies signs of infection. He has no issues or complaints today. 7/13; Patient presents for 1 week follow-up. He has been using Hydrofera Blue under 4-layer compression and doing well with this. He denies signs of infection. He has no issues or complaints today. 7/20; patient presents for 1 week follow-up. We have been using 4-layer compression with Hydrofera Blue underneath. He denies signs of infection. He did notice some slight irritation over the front of his left foot and lower leg from the wrap. Overall he is doing well and reports progress to the wound healing. Electronic Signature(s) Signed: 12/16/2020 12:36:08 PM By: Geralyn CorwinHoffman, Janel Beane DO Entered By: Geralyn CorwinHoffman, Janiqua Friscia on 12/16/2020 12:33:09 Rodney IdolPARKER, Colin R. ([id number]) -------------------------------------------------------------------------------- Physical Exam Details Patient Name: Rodney IdolRKER, Emoni R. Date of Service: 12/16/2020 11:15 AM Medical Record Number: [id number] Patient Account Number: [id number] Date of Birth/Sex: 03/09/1956 (65 y.o. M) Treating RN: Huel CoventryWoody, Kim Primary Care Provider: Lenon OmsGauger, Sarah Other Clinician: Referring Provider: Lenon OmsGauger, Sarah Treating Provider/Extender: Tilda FrancoHoffman, Monae Topping Weeks in Treatment:  6 Constitutional . Psychiatric . Notes Left lower extremity: On the anterior aspect there is a open wound with granulation tissue And scant nonviable tissue present. No evidence of infection. Mild area of skin breakdown to the left anterior foot and lower leg. Electronic Signature(s) Signed: 12/16/2020 12:36:08 PM By: Geralyn CorwinHoffman, Yanai Hobson DO Entered By: Geralyn CorwinHoffman, Hadyn Azer on 12/16/2020 12:33:57 Rodney IdolPARKER, Shaune R. ([id number]) -------------------------------------------------------------------------------- Physician Orders Details Patient Name: Rodney IdolRKER, Greer R. Date of Service: 12/16/2020 11:15 AM Medical Record Number: [id number] Patient Account Number: [id number] Date of Birth/Sex: 01/13/1956 (65 y.o. M) Treating RN: Hansel FeinsteinBishop, Joy Primary Care Provider: Lenon OmsGauger, Sarah Other Clinician: Referring Provider: Lenon OmsGauger, Sarah Treating Provider/Extender: Tilda FrancoHoffman, Tomislav Micale Weeks in Treatment: 6 Verbal / Phone Orders: No Diagnosis Coding Follow-up Appointments o Return Appointment in 1 week. o Nurse Visit as needed Bathing/ Shower/ Hygiene o May shower with wound dressing protected with water repellent cover or cast protector. o No tub bath. Edema Control - Lymphedema / Segmental Compressive Device / Other o Optional: One layer of unna paste to top of compression wrap (to act as an anchor). Medications-Please add to medication list. o Other: - Tylenol for pain Wound Treatment Wound #1 - Lower Leg Wound Laterality: Left, Medial Cleanser: Soap and Water 1 x Per Week/15 Days Discharge Instructions: Gently cleanse wound with antibacterial soap, rinse and pat dry prior to dressing wounds Topical: Santyl Collagenase Ointment, 30 (gm), tube 1 x Per Week/15 Days Discharge Instructions: Apply nickel thick to wound bed only Primary Dressing: Gauze 1 x Per Week/15 Days Discharge Instructions: Dry gauze as bolster on top on hydrofera Primary Dressing: Hydrofera Blue Ready Transfer Foam, 2.5x2.5  (in/in) 1 x Per Week/15 Days Discharge Instructions: Cut Hydrofera Blue Ready, pack in undermining and apply to wound bed Secondary Dressing: Xtrasorb Medium 4x5 (in/in) 1 x Per Week/15 Days Discharge Instructions: Apply to wound as directed. Do not cut. Compression Wrap: Medichoice 4 layer Compression System, 35-40 mmHG 1 x Per Week/15 Days Discharge Instructions: Apply multi-layer wrap as directed. Electronic Signature(s) Signed: 12/16/2020 12:36:08 PM By: Geralyn CorwinHoffman, Kierstan Auer DO Signed: 12/17/2020 10:03:03 AM By: Hansel FeinsteinBishop, Joy  Entered By: Hansel Feinstein on 12/16/2020 12:07:17 Rodney Oneal ([id number]) -------------------------------------------------------------------------------- Problem List Details Patient Name: ELIAS, DENNINGTON. Date of Service: 12/16/2020 11:15 AM Medical Record Number: [id number] Patient Account Number: [id number] Date of Birth/Sex: June 27, 1955 (65 y.o. M) Treating RN: Huel Coventry Primary Care Provider: Lenon Oms Other Clinician: Referring Provider: Lenon Oms Treating Provider/Extender: Tilda Franco in Treatment: 6 Active Problems ICD-10 Encounter Code Description Active Date MDM Diagnosis S80.12XD Contusion of left lower leg, subsequent encounter 11/04/2020 No Yes 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 11/04/2020 No Yes specified severity T45.515S Adverse effect of anticoagulants, sequela 11/04/2020 No Yes Inactive Problems Resolved Problems Electronic Signature(s) Signed: 12/16/2020 12:36:08 PM By: Geralyn Corwin DO Entered By: Geralyn Corwin on 12/16/2020 12:30:58 Rodney Oneal ([id number]) -------------------------------------------------------------------------------- Progress Note Details Patient Name: Rodney Oneal. Date of Service: 12/16/2020 11:15 AM Medical Record Number: [id number] Patient Account Number: [id number] Date of Birth/Sex: 21-Apr-1956 (65 y.o. M) Treating RN: Huel Coventry Primary Care  Provider: Lenon Oms Other Clinician: Referring Provider: Lenon Oms Treating Provider/Extender: Tilda Franco in Treatment: 6 Subjective Chief Complaint Information obtained from Patient 11/04/2020; patient is here for review of a hematoma on his left lower leg History of Present Illness (HPI) ADMISSION 11/04/2020 This is a 65 year old man who is on chronic Coumadin secondary to atrial fibrillation. He had a fall on 5/25 out of bed but he managed to hit his anterior left leg on a tool that was lying in the vicinity. He was seen at his primary doctor's office on 10/30/2020 and there was a concern for cellulitis he was put on cephalexin 500 3 times daily that he still taking. He currently has a significant swelling with some nonviable skin on top of it. He says this is begin leaking but it is not frankly open. He is not complaining of a lot of pain. Past medical history includes type 2 diabetes, chronic A. fib on Coumadin, hypertension, history of a CVA ABI in this clinic was 0.92 on the left 6/15 left medial lower leg hematoma secondary to trauma on Coumadin. The patient arrives back in clinic with a week of 4-layer compression with calcium alginate with the area on his left medial lower leg just above the ankle looking completely the same. No evidence that this is reabsorbing. Still very tight. I do not see any evidence of infection however I was hoping for some improvement in the amount of periwound swelling. 6/22; left medial lower leg hematoma secondary to trauma in a patient on chronic Coumadin. Last week I made the decision to open this. He had quite a bit of undermining in the deep wound. We use silver alginate. Really dramatic improvement in the undermining although the wound is still deep. 6/29; patient presents for 1 week follow-up. He has been using Hydrofera Blue under 4-layer compression and tolerating this well. He denies signs of infection. 7/6; patient presents for 1  week follow-up. He has been using Hydrofera Blue under 4-layer compression and tolerating this well. He denies signs of infection. He has no issues or complaints today. 7/13; Patient presents for 1 week follow-up. He has been using Hydrofera Blue under 4-layer compression and doing well with this. He denies signs of infection. He has no issues or complaints today. 7/20; patient presents for 1 week follow-up. We have been using 4-layer compression with Hydrofera Blue underneath. He denies signs of infection. He did notice some slight irritation over the front of his left  foot and lower leg from the wrap. Overall he is doing well and reports progress to the wound healing. Patient History Information obtained from Patient. Social History Former smoker - ended on 05/30/2013, Marital Status - Married, Alcohol Use - Never, Drug Use - No History, Caffeine Use - Rarely. Medical History Eyes Patient has history of Cataracts - surgery Respiratory Patient has history of Sleep Apnea Cardiovascular Patient has history of Arrhythmia - afib, Coronary Artery Disease, Hypertension Endocrine Patient has history of Type II Diabetes Musculoskeletal Patient has history of Osteoarthritis TERRYL, MOLINELLI R. ([id number]) Objective Constitutional Vitals Time Taken: 11:27 AM, Height: 71 in, Weight: 311 lbs, BMI: 43.4, Temperature: 98.2 F, Pulse: 75 bpm, Respiratory Rate: 18 breaths/min, Blood Pressure: 131/84 mmHg. General Notes: Left lower extremity: On the anterior aspect there is a open wound with granulation tissue And scant nonviable tissue present. No evidence of infection. Mild area of skin breakdown to the left anterior foot and lower leg. Integumentary (Hair, Skin) Wound #1 status is Open. Original cause of wound was Skin Tear/Laceration. The date acquired was: 10/21/2020. The wound has been in treatment 6 weeks. The wound is located on the Left,Medial Lower Leg. The wound measures 1.9cm length x 1.4cm  width x 0.4cm depth; 2.089cm^2 area and 0.836cm^3 volume. There is Fat Layer (Subcutaneous Tissue) exposed. There is no tunneling or undermining noted. There is a medium amount of serosanguineous drainage noted. There is large (67-100%) red, pink granulation within the wound bed. There is no necrotic tissue within the wound bed. Assessment Active Problems ICD-10 Contusion of left lower leg, subsequent encounter Non-pressure chronic ulcer of other part of left lower leg with other specified severity Adverse effect of anticoagulants, sequela Patient has shown significant improvement in size and appearance since last clinic visit. I debrided nonviable tissue. I recommended continuing with Hydrofera Blue, Santyl under compression wrap. We will add extra padding to the top of the foot to help prevent skin breakdown from the wrap. No signs of infection on exam. Follow-up in 1 week. Procedures Wound #1 Pre-procedure diagnosis of Wound #1 is a Trauma, Other located on the Left,Medial Lower Leg . There was a Excisional Skin/Subcutaneous Tissue Debridement with a total area of 2.66 sq cm performed by Geralyn Corwin, MD. With the following instrument(s): Curette Material removed includes Subcutaneous Tissue, Slough, and Biofilm after achieving pain control using Lidocaine. A time out was conducted at 12:05, prior to the start of the procedure. A Moderate amount of bleeding was controlled with Gel Foam. The procedure was tolerated well. Post Debridement Measurements: 1.9cm length x 1.4cm width x 0.4cm depth; 0.836cm^3 volume. Character of Wound/Ulcer Post Debridement is improved. Post procedure Diagnosis Wound #1: Same as Pre-Procedure Pre-procedure diagnosis of Wound #1 is a Trauma, Other located on the Left,Medial Lower Leg . There was a Four Layer Compression Therapy Procedure by Hansel Feinstein, RN. Post procedure Diagnosis Wound #1: Same as Pre-Procedure Plan Follow-up Appointments: Return  Appointment in 1 week. Nurse Visit as needed Bathing/ Shower/ Hygiene: May shower with wound dressing protected with water repellent cover or cast protector. No tub bath. Edema Control - Lymphedema / Segmental Compressive Device / Other: Optional: One layer of unna paste to top of compression wrap (to act as an anchor). Medications-Please add to medication list.: Other: - Tylenol for pain GURSHAN, SETTLEMIRE ([id number]) WOUND #1: - Lower Leg Wound Laterality: Left, Medial Cleanser: Soap and Water 1 x Per Week/15 Days Discharge Instructions: Gently cleanse wound with antibacterial soap, rinse and pat  dry prior to dressing wounds Topical: Santyl Collagenase Ointment, 30 (gm), tube 1 x Per Week/15 Days Discharge Instructions: Apply nickel thick to wound bed only Primary Dressing: Gauze 1 x Per Week/15 Days Discharge Instructions: Dry gauze as bolster on top on hydrofera Primary Dressing: Hydrofera Blue Ready Transfer Foam, 2.5x2.5 (in/in) 1 x Per Week/15 Days Discharge Instructions: Cut Hydrofera Blue Ready, pack in undermining and apply to wound bed Secondary Dressing: Xtrasorb Medium 4x5 (in/in) 1 x Per Week/15 Days Discharge Instructions: Apply to wound as directed. Do not cut. Compression Wrap: Medichoice 4 layer Compression System, 35-40 mmHG 1 x Per Week/15 Days Discharge Instructions: Apply multi-layer wrap as directed. 1. In office sharp debridement 2. Hydrofera Blue, Santyl under 3 layer compression 3. Follow-up in 1 week Electronic Signature(s) Signed: 12/16/2020 12:36:08 PM By: Geralyn Corwin DO Entered By: Geralyn Corwin on 12/16/2020 12:35:33 WILBERT, HAYASHI ([id number]) -------------------------------------------------------------------------------- ROS/PFSH Details Patient Name: Rodney Oneal. Date of Service: 12/16/2020 11:15 AM Medical Record Number: [id number] Patient Account Number: [id number] Date of Birth/Sex: May 03, 1956 (65 y.o. M) Treating RN: Huel Coventry Primary Care Provider: Lenon Oms Other Clinician: Referring Provider: Lenon Oms Treating Provider/Extender: Tilda Franco in Treatment: 6 Information Obtained From Patient Eyes Medical History: Positive for: Cataracts - surgery Respiratory Medical History: Positive for: Sleep Apnea Cardiovascular Medical History: Positive for: Arrhythmia - afib; Coronary Artery Disease; Hypertension Endocrine Medical History: Positive for: Type II Diabetes Time with diabetes: 2002 Treated with: Insulin, Oral agents Blood sugar tested every day: No Musculoskeletal Medical History: Positive for: Osteoarthritis HBO Extended History Items Eyes: Cataracts Immunizations Pneumococcal Vaccine: Received Pneumococcal Vaccination: Yes Implantable Devices None Family and Social History Former smoker - ended on 05/30/2013; Marital Status - Married; Alcohol Use: Never; Drug Use: No History; Caffeine Use: Rarely; Financial Concerns: No; Food, Clothing or Shelter Needs: No; Support System Lacking: No; Transportation Concerns: No Electronic Signature(s) Signed: 12/16/2020 12:36:08 PM By: Geralyn Corwin DO Signed: 12/17/2020 11:45:43 AM By: Elliot Gurney, BSN, RN, CWS, Kim RN, BSN Entered By: Geralyn Corwin on 12/16/2020 12:33:16 KADDEN, OSTERHOUT ([id number]) -------------------------------------------------------------------------------- SuperBill Details Patient Name: LEMARIO, CHAIKIN. Date of Service: 12/16/2020 Medical Record Number: [id number] Patient Account Number: [id number] Date of Birth/Sex: May 21, 1956 (65 y.o. 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  specified severity T45.515S Adverse effect of anticoagulants, sequela Facility Procedures CPT4 Code: [id number] Description: 11042 - DEB SUBQ TISSUE 20 SQ CM/< Modifier: Quantity: 1 CPT4 Code: Description: ICD-10 Diagnosis Description 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 spec Modifier: ified severity Quantity: Physician Procedures CPT4 Code: [id number] Description: 11042 - WC PHYS SUBQ TISS 20 SQ CM Modifier: Quantity: 1 CPT4 Code: Description: ICD-10 Diagnosis Description 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 spec Modifier: ified severity Quantity: Electronic Signature(s) Signed: 12/16/2020 12:36:08 PM By: Geralyn Corwin DO Entered By: Geralyn Corwin on 12/16/2020 12:35:39

## 2020-12-23 ENCOUNTER — Other Ambulatory Visit: Payer: Self-pay

## 2020-12-23 ENCOUNTER — Encounter (HOSPITAL_BASED_OUTPATIENT_CLINIC_OR_DEPARTMENT_OTHER): Payer: Medicare Other | Admitting: Internal Medicine

## 2020-12-23 DIAGNOSIS — S8012XD Contusion of left lower leg, subsequent encounter: Secondary | ICD-10-CM

## 2020-12-23 DIAGNOSIS — T45515S Adverse effect of anticoagulants, sequela: Secondary | ICD-10-CM | POA: Diagnosis not present

## 2020-12-23 DIAGNOSIS — L97828 Non-pressure chronic ulcer of other part of left lower leg with other specified severity: Secondary | ICD-10-CM | POA: Diagnosis not present

## 2020-12-23 DIAGNOSIS — E11622 Type 2 diabetes mellitus with other skin ulcer: Secondary | ICD-10-CM | POA: Diagnosis not present

## 2020-12-23 NOTE — Progress Notes (Signed)
Rodney Oneal, Rodney R. ([id number]) Visit Report for 12/23/2020 Chief Complaint Document Details Patient Name: Rodney Oneal, Rodney R. Date of Service: 12/23/2020 11:15 AM Medical Record Number: [id number] Patient Account Number: [id number] Date of Birth/Sex: 10/07/1955 (65 y.o. M) Treating RN: Rodney Oneal Primary Care Provider: Lenon OmsGauger, Oneal Other Clinician: Referring Provider: Lenon OmsGauger, Oneal Treating Provider/Extender: Rodney Oneal: 7 Information Obtained from: Patient Chief Complaint 11/04/2020; patient is here for review of a hematoma on his left lower leg Electronic Signature(s) Signed: 12/23/2020 12:20:32 PM By: Rodney Oneal Entered By: Rodney CorwinHoffman, Rayneisha Oneal on 12/23/2020 12:14:49 Rodney Oneal, Rodney R. ([id number]) -------------------------------------------------------------------------------- HPI Details Patient Name: Rodney Oneal, Rodney R. Date of Service: 12/23/2020 11:15 AM Medical Record Number: [id number] Patient Account Number: [id number] Date of Birth/Sex: 08/07/1955 (65 y.o. M) Treating RN: Rodney Oneal Primary Care Provider: Lenon OmsGauger, Oneal Other Clinician: Referring Provider: Lenon OmsGauger, Oneal Treating Provider/Extender: Rodney FrancoHoffman, Irais Oneal Weeks in Oneal: 7 History of Present Illness HPI Description: ADMISSION 11/04/2020 This is a 65 year old man who is on chronic Coumadin secondary to atrial fibrillation. He had a fall on 5/25 out of bed but he managed to hit his anterior left leg on a tool that was lying in the vicinity. He was seen at his primary doctor's office on 10/30/2020 and there was a concern for cellulitis he was put on cephalexin 500 3 times daily that he still taking. He currently has a significant swelling with some nonviable skin on top of it. He says this is begin leaking but it is not frankly open. He is not complaining of a lot of pain. Past medical history includes type 2 diabetes, chronic A. fib on Coumadin, hypertension, history of a CVA ABI in this clinic was 0.92  on the left 6/15 left medial lower leg hematoma secondary to trauma on Coumadin. The patient arrives back in clinic with a week of 4-layer compression with calcium alginate with the area on his left medial lower leg just above the ankle looking completely the same. No evidence that this is reabsorbing. Still very tight. I Oneal not see any evidence of infection however I was hoping for some improvement in the amount of periwound swelling. 6/22; left medial lower leg hematoma secondary to trauma in a patient on chronic Coumadin. Last week I made the decision to open this. He had quite a bit of undermining in the deep wound. We use silver alginate. Really dramatic improvement in the undermining although the wound is still deep. 6/29; patient presents for 1 week follow-up. He has been using Hydrofera Blue under 4-layer compression and tolerating this well. He denies signs of infection. 7/6; patient presents for 1 week follow-up. He has been using Hydrofera Blue under 4-layer compression and tolerating this well. He denies signs of infection. He has no issues or complaints today. 7/13; Patient presents for 1 week follow-up. He has been using Hydrofera Blue under 4-layer compression and doing well with this. He denies signs of infection. He has no issues or complaints today. 7/20; patient presents for 1 week follow-up. We have been using 4-layer compression with Hydrofera Blue underneath. He denies signs of infection. He did notice some slight irritation over the front of his left foot and lower leg from the wrap. Overall he is doing well and reports progress to the wound healing. 7/27; patient presents for 1 week follow-up. He has been tolerating the compression wrap well. He reports slight irritation to his toes with itching. Overall he denies signs of infection and is doing well. Electronic Signature(s) Signed: 12/23/2020  12:20:32 PM By: Rodney Corwin Oneal Entered By: Rodney Corwin on 12/23/2020  12:15:34 Rodney Oneal ([id number]) -------------------------------------------------------------------------------- Physical Exam Details Patient Name: Rodney Oneal. Date of Service: 12/23/2020 11:15 AM Medical Record Number: [id number] Patient Account Number: [id number] Date of Birth/Sex: 02/12/1956 (65 y.o. M) Treating RN: Rodney Coventry Primary Care Provider: Lenon Oms Other Clinician: Referring Provider: Lenon Oms Treating Provider/Extender: Rodney Franco in Oneal: 7 Constitutional . Cardiovascular . Psychiatric . Notes Left lower extremity: On the anterior aspect there is a open wound with granulation tissue. No evidence of infection. Mild skin irritation to his toes. Electronic Signature(s) Signed: 12/23/2020 12:20:32 PM By: Rodney Corwin Oneal Entered By: Rodney Corwin on 12/23/2020 12:17:19 Rodney Oneal ([id number]) -------------------------------------------------------------------------------- Physician Orders Details Patient Name: Rodney Oneal. Date of Service: 12/23/2020 11:15 AM Medical Record Number: [id number] Patient Account Number: [id number] Date of Birth/Sex: [birth year] (65 y.o. M) Treating RN: Hansel Feinstein Primary Care Provider: Lenon Oms Other Clinician: Referring Provider: Lenon Oms Treating Provider/Extender: Rodney Franco in Oneal: 7 Verbal / Phone Orders: No Diagnosis Coding Follow-up Appointments o Return Appointment in 1 week. o Nurse Visit as needed Bathing/ Shower/ Hygiene o May shower with wound dressing protected with water repellent cover or cast protector. o No tub bath. Edema Control - Lymphedema / Segmental Compressive Device / Other o Optional: One layer of unna paste to top of compression wrap (to act as an anchor). Additional Orders / Instructions o Other: - If desired recommend over the counter antifungal cream to left toes Medications-Please add to medication list. o  Other: - Tylenol for pain Wound Oneal Wound #1 - Lower Leg Wound Laterality: Left, Medial Cleanser: Soap and Water 1 x Per Week/15 Days Discharge Instructions: Gently cleanse wound with antibacterial soap, rinse and pat dry prior to dressing wounds Topical: Santyl Collagenase Ointment, 30 (gm), tube 1 x Per Week/15 Days Discharge Instructions: Apply nickel thick to wound bed only Primary Dressing: Gauze 1 x Per Week/15 Days Discharge Instructions: Dry gauze as bolster on top on hydrofera Primary Dressing: Hydrofera Blue Ready Transfer Foam, 2.5x2.5 (in/in) 1 x Per Week/15 Days Discharge Instructions: Cut Hydrofera Blue Ready, pack in undermining and apply to wound bed Secondary Dressing: ABD Pad 5x9 (in/in) 1 x Per Week/15 Days Discharge Instructions: Cover with ABD pad Secondary Dressing: Xtrasorb Medium 4x5 (in/in) 1 x Per Week/15 Days Discharge Instructions: Apply to wound as directed. Oneal not cut. Compression Wrap: Medichoice 4 layer Compression System, 35-40 mmHG 1 x Per Week/15 Days Discharge Instructions: Apply multi-layer wrap as directed. Electronic Signature(s) Signed: 12/23/2020 12:20:32 PM By: Rodney Corwin Oneal Signed: 12/23/2020 1:09:54 PM By: Hansel Feinstein Entered By: Hansel Feinstein on 12/23/2020 12:02:13 Rodney Oneal ([id number]) -------------------------------------------------------------------------------- Problem List Details Patient Name: MARSHALL, KAMPF. Date of Service: 12/23/2020 11:15 AM Medical Record Number: [id number] Patient Account Number: [id number] Date of Birth/Sex: [birth year] (65 y.o. M) Treating RN: Rodney Coventry Primary Care Provider: Lenon Oms Other Clinician: Referring Provider: Lenon Oms Treating Provider/Extender: Rodney Franco in Oneal: 7 Active Problems ICD-10 Encounter Code Description Active Date MDM Diagnosis S80.12XD Contusion of left lower leg, subsequent encounter 11/04/2020 No Yes L97.828 Non-pressure chronic ulcer of  other part of left lower leg with other 11/04/2020 No Yes specified severity T45.515S Adverse effect of anticoagulants, sequela 11/04/2020 No Yes Inactive Problems Resolved Problems Electronic Signature(s) Signed: 12/23/2020 12:20:32 PM By: Rodney Corwin Oneal Entered By: Rodney Corwin on 12/23/2020 12:13:13 Rodney Oneal ([id number]) -------------------------------------------------------------------------------- Progress Note Details Patient Name: Rodney Desanctis  R. Date of Service: 12/23/2020 11:15 AM Medical Record Number: [id number] Patient Account Number: [id number] Date of Birth/Sex: February 28, 1956 (64 y.o. M) Treating RN: Rodney Coventry Primary Care Provider: Lenon Oms Other Clinician: Referring Provider: Lenon Oms Treating Provider/Extender: Rodney Franco in Oneal: 7 Subjective Chief Complaint Information obtained from Patient 11/04/2020; patient is here for review of a hematoma on his left lower leg History of Present Illness (HPI) ADMISSION 11/04/2020 This is a 65 year old man who is on chronic Coumadin secondary to atrial fibrillation. He had a fall on 5/25 out of bed but he managed to hit his anterior left leg on a tool that was lying in the vicinity. He was seen at his primary doctor's office on 10/30/2020 and there was a concern for cellulitis he was put on cephalexin 500 3 times daily that he still taking. He currently has a significant swelling with some nonviable skin on top of it. He says this is begin leaking but it is not frankly open. He is not complaining of a lot of pain. Past medical history includes type 2 diabetes, chronic A. fib on Coumadin, hypertension, history of a CVA ABI in this clinic was 0.92 on the left 6/15 left medial lower leg hematoma secondary to trauma on Coumadin. The patient arrives back in clinic with a week of 4-layer compression with calcium alginate with the area on his left medial lower leg just above the ankle looking completely the  same. No evidence that this is reabsorbing. Still very tight. I Oneal not see any evidence of infection however I was hoping for some improvement in the amount of periwound swelling. 6/22; left medial lower leg hematoma secondary to trauma in a patient on chronic Coumadin. Last week I made the decision to open this. He had quite a bit of undermining in the deep wound. We use silver alginate. Really dramatic improvement in the undermining although the wound is still deep. 6/29; patient presents for 1 week follow-up. He has been using Hydrofera Blue under 4-layer compression and tolerating this well. He denies signs of infection. 7/6; patient presents for 1 week follow-up. He has been using Hydrofera Blue under 4-layer compression and tolerating this well. He denies signs of infection. He has no issues or complaints today. 7/13; Patient presents for 1 week follow-up. He has been using Hydrofera Blue under 4-layer compression and doing well with this. He denies signs of infection. He has no issues or complaints today. 7/20; patient presents for 1 week follow-up. We have been using 4-layer compression with Hydrofera Blue underneath. He denies signs of infection. He did notice some slight irritation over the front of his left foot and lower leg from the wrap. Overall he is doing well and reports progress to the wound healing. 7/27; patient presents for 1 week follow-up. He has been tolerating the compression wrap well. He reports slight irritation to his toes with itching. Overall he denies signs of infection and is doing well. Patient History Information obtained from Patient. Social History Former smoker - ended on 05/30/2013, Marital Status - Married, Alcohol Use - Never, Drug Use - No History, Caffeine Use - Rarely. Medical History Eyes Patient has history of Cataracts - surgery Respiratory Patient has history of Sleep Apnea Cardiovascular Patient has history of Arrhythmia - afib, Coronary Artery  Disease, Hypertension Endocrine Patient has history of Type II Diabetes Musculoskeletal Patient has history of Osteoarthritis Rodney Oneal, Rodney R. ([id number]) Objective Constitutional Vitals Time Taken: 11:51 AM, Height: 71 in, Weight: 311 lbs, BMI:  43.4, Temperature: 98.2 F, Pulse: 80 bpm, Respiratory Rate: 18 breaths/min, Blood Pressure: 146/88 mmHg. General Notes: Left lower extremity: On the anterior aspect there is a open wound with granulation tissue. No evidence of infection. Mild skin irritation to his toes. Integumentary (Hair, Skin) Wound #1 status is Open. Original cause of wound was Skin Tear/Laceration. The date acquired was: 10/21/2020. The wound has been in Oneal 7 weeks. The wound is located on the Left,Medial Lower Leg. The wound measures 1.7cm length x 0.8cm width x 0.3cm depth; 1.068cm^2 area and 0.32cm^3 volume. There is Fat Layer (Subcutaneous Tissue) exposed. There is no tunneling or undermining noted. There is a medium amount of serosanguineous drainage noted. There is large (67-100%) red, pink granulation within the wound bed. There is a small (1-33%) amount of necrotic tissue within the wound bed including Adherent Slough. Assessment Active Problems ICD-10 Contusion of left lower leg, subsequent encounter Non-pressure chronic ulcer of other part of left lower leg with other specified severity Adverse effect of anticoagulants, sequela Patient's wound continues to improve in size and appearance. I recommended continuing Hydrofera Blue under 4-layer compression. For his toes (left foot) he may have tinea pedis and I recommended an antifungal cream. Procedures Wound #1 Pre-procedure diagnosis of Wound #1 is a Trauma, Other located on the Left,Medial Lower Leg . There was a Four Layer Compression Therapy Procedure by Hansel Feinstein, RN. Post procedure Diagnosis Wound #1: Same as Pre-Procedure Plan Follow-up Appointments: Return Appointment in 1 week. Nurse Visit as  needed Bathing/ Shower/ Hygiene: May shower with wound dressing protected with water repellent cover or cast protector. No tub bath. Edema Control - Lymphedema / Segmental Compressive Device / Other: Optional: One layer of unna paste to top of compression wrap (to act as an anchor). Additional Orders / Instructions: Other: - If desired recommend over the counter antifungal cream to left toes Medications-Please add to medication list.: Other: - Tylenol for pain WOUND #1: - Lower Leg Wound Laterality: Left, Medial Cleanser: Soap and Water 1 x Per Week/15 Days Discharge Instructions: Gently cleanse wound with antibacterial soap, rinse and pat dry prior to dressing wounds Topical: Santyl Collagenase Ointment, 30 (gm), tube 1 x Per Week/15 Days Discharge Instructions: Apply nickel thick to wound bed only Primary Dressing: Gauze 1 x Per Week/15 Days ELISA, SORLIE ([id number]) Discharge Instructions: Dry gauze as bolster on top on hydrofera Primary Dressing: Hydrofera Blue Ready Transfer Foam, 2.5x2.5 (in/in) 1 x Per Week/15 Days Discharge Instructions: Cut Hydrofera Blue Ready, pack in undermining and apply to wound bed Secondary Dressing: ABD Pad 5x9 (in/in) 1 x Per Week/15 Days Discharge Instructions: Cover with ABD pad Secondary Dressing: Xtrasorb Medium 4x5 (in/in) 1 x Per Week/15 Days Discharge Instructions: Apply to wound as directed. Oneal not cut. Compression Wrap: Medichoice 4 layer Compression System, 35-40 mmHG 1 x Per Week/15 Days Discharge Instructions: Apply multi-layer wrap as directed. 1. Hydrofera Blue under 4-layer compression 2. Antifungal cream to his toes - on left side Electronic Signature(s) Signed: 12/23/2020 12:20:32 PM By: Rodney Corwin Oneal Entered By: Rodney Corwin on 12/23/2020 12:19:41 CHUCKY, HOMES ([id number]) -------------------------------------------------------------------------------- ROS/PFSH Details Patient Name: Rodney Oneal. Date of  Service: 12/23/2020 11:15 AM Medical Record Number: [id number] Patient Account Number: [id number] Date of Birth/Sex: 1955-08-25 (65 y.o. M) Treating RN: Rodney Coventry Primary Care Provider: Lenon Oms Other Clinician: Referring Provider: Lenon Oms Treating Provider/Extender: Rodney Franco in Oneal: 7 Information Obtained From Patient Eyes Medical History: Positive for: Cataracts - surgery Respiratory Medical  History: Positive for: Sleep Apnea Cardiovascular Medical History: Positive for: Arrhythmia - afib; Coronary Artery Disease; Hypertension Endocrine Medical History: Positive for: Type II Diabetes Time with diabetes: 2002 Treated with: Insulin, Oral agents Blood sugar tested every day: No Musculoskeletal Medical History: Positive for: Osteoarthritis HBO Extended History Items Eyes: Cataracts Immunizations Pneumococcal Vaccine: Received Pneumococcal Vaccination: Yes Received Pneumococcal Vaccination On or After 60th Birthday: No Implantable Devices None Family and Social History Former smoker - ended on 05/30/2013; Marital Status - Married; Alcohol Use: Never; Drug Use: No History; Caffeine Use: Rarely; Financial Concerns: No; Food, Clothing or Shelter Needs: No; Support System Lacking: No; Transportation Concerns: No Electronic Signature(s) Signed: 12/23/2020 12:20:32 PM By: Rodney Corwin Oneal Signed: 12/23/2020 12:22:34 PM By: Elliot Gurney, BSN, RN, CWS, Kim RN, BSN Entered By: Rodney Corwin on 12/23/2020 12:15:50 Rodney Oneal ([id number]) -------------------------------------------------------------------------------- SuperBill Details Patient Name: YUTAKA, HOLBERG. Date of Service: 12/23/2020 Medical Record Number: [id number] Patient Account Number: [id number] Date of Birth/Sex: 06/03/1955 (65 y.o. M) Treating RN: Hansel Feinstein Primary Care Provider: Lenon Oms Other Clinician: Referring Provider: Lenon Oms Treating Provider/Extender: Rodney Franco in Oneal: 7 Diagnosis Coding ICD-10 Codes Code Description [phone] Contusion of left lower leg, subsequent encounter 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 specified severity T45.515S Adverse effect of anticoagulants, sequela Facility Procedures CPT4 Code: [id number] Description: (Facility Use Only) [phone] - APPLY MULTLAY COMPRS LWR LT LEG Modifier: Quantity: 1 Physician Procedures CPT4 Code: [id number] Description: 99213 - WC PHYS LEVEL 3 - EST PT Modifier: Quantity: 1 CPT4 Code: Description: ICD-10 Diagnosis Description S80.12XD Contusion of left lower leg, subsequent encounter 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 spe T45.515S Adverse effect of anticoagulants, sequela Modifier: cified severity Quantity: Electronic Signature(s) Signed: 12/23/2020 12:20:32 PM By: Rodney Corwin Oneal Entered By: Rodney Corwin on 12/23/2020 12:19:57

## 2020-12-23 NOTE — Progress Notes (Signed)
NAT, LOWENTHAL ([id number]) Visit Report for 12/23/2020 Arrival Information Details Patient Name: Rodney Oneal, Rodney Oneal. Date of Service: 12/23/2020 11:15 AM Medical Record Number: [id number] Patient Account Number: [id number] Date of Birth/Sex: 1955-06-19 (65 y.o. M) Treating RN: Donnamarie Poag Primary Care Anastassia Noack: Gaetano Net Other Clinician: Referring Michel Eskelson: Gaetano Net Treating Esty Ahuja/Extender: Yaakov Guthrie in Treatment: 7 Visit Information History Since Last Visit Added or deleted any medications: No Patient Arrived: Ambulatory Had a fall or experienced change in No Arrival Time: 11:50 activities of daily living that may affect Accompanied By: wife risk of falls: Transfer Assistance: None Hospitalized since last visit: No Patient Identification Verified: Yes Has Dressing in Place as Prescribed: Yes Secondary Verification Process Completed: Yes Has Compression in Place as Prescribed: Yes Patient Requires Transmission-Based No Pain Present Now: No Precautions: Patient Has Alerts: Yes Patient Alerts: Patient on Blood Thinner DIABETIC COUMADIN Electronic Signature(s) Signed: 12/23/2020 1:09:54 PM By: Donnamarie Poag Entered By: Donnamarie Poag on 12/23/2020 11:52:27 Rodney Oneal ([id number]) -------------------------------------------------------------------------------- Clinic Level of Care Assessment Details Patient Name: Rodney Oneal. Date of Service: 12/23/2020 11:15 AM Medical Record Number: [id number] Patient Account Number: [id number] Date of Birth/Sex: Sep 29, 1955 (65 y.o. M) Treating RN: Donnamarie Poag Primary Care Tawan Corkern: Gaetano Net Other Clinician: Referring Lavella Myren: Gaetano Net Treating Dewan Emond/Extender: Yaakov Guthrie in Treatment: 7 Clinic Level of Care Assessment Items TOOL 1 Quantity Score [] - Use when EandM and Procedure is performed on INITIAL visit 0 ASSESSMENTS - Nursing Assessment / Reassessment [] - General Physical Exam  (combine w/ comprehensive assessment (listed just below) when performed on new 0 pt. evals) [] - 0 Comprehensive Assessment (HX, ROS, Risk Assessments, Wounds Hx, etc.) ASSESSMENTS - Wound and Skin Assessment / Reassessment [] - Dermatologic / Skin Assessment (not related to wound area) 0 ASSESSMENTS - Ostomy and/or Continence Assessment and Care [] - Incontinence Assessment and Management 0 [] - 0 Ostomy Care Assessment and Management (repouching, etc.) PROCESS - Coordination of Care [] - Simple Patient / Family Education for ongoing care 0 [] - 0 Complex (extensive) Patient / Family Education for ongoing care [] - 0 Staff obtains Consents, Records, Test Results / Process Orders [] - 0 Staff telephones HHA, Nursing Homes / Clarify orders / etc [] - 0 Routine Transfer to another Facility (non-emergent condition) [] - 0 Routine Hospital Admission (non-emergent condition) [] - 0 New Admissions / Biomedical engineer / Ordering NPWT, Apligraf, etc. [] - 0 Emergency Hospital Admission (emergent condition) PROCESS - Special Needs [] - Pediatric / Minor Patient Management 0 [] - 0 Isolation Patient Management [] - 0 Hearing / Language / Visual special needs [] - 0 Assessment of Community assistance (transportation, D/C planning, etc.) [] - 0 Additional assistance / Altered mentation [] - 0 Support Surface(s) Assessment (bed, cushion, seat, etc.) INTERVENTIONS - Miscellaneous [] - External ear exam 0 [] - 0 Patient Transfer (multiple staff / Civil Service fast streamer / Similar devices) [] - 0 Simple Staple / Suture removal (25 or less) [] - 0 Complex Staple / Suture removal (26 or more) [] - 0 Hypo/Hyperglycemic Management (do not check if billed separately) [] - 0 Ankle / Brachial Index (ABI) - do not check if billed separately Has the patient been seen at the hospital within the last three years: Yes Total Score: 0 Level Of Care: ____ Rodney Oneal ([id number]) Electronic  Signature(s) Signed: 12/23/2020 1:09:54 PM By: Donnamarie Poag Entered By: Donnamarie Poag on 12/23/2020 12:10:43 Harless Nakayama R. ([id number]) -------------------------------------------------------------------------------- Compression  Therapy Details Patient Name: Rodney Oneal. Date of Service: 12/23/2020 11:15 AM Medical Record Number: [id number] Patient Account Number: [id number] Date of Birth/Sex: 01-10-1956 (65 y.o. M) Treating RN: Donnamarie Poag Primary Care Provider: Gaetano Net Other Clinician: Referring Provider: Gaetano Net Treating Provider/Extender: Yaakov Guthrie in Treatment: 7 Compression Therapy Performed for Wound Assessment: Wound #1 Left,Medial Lower Leg Performed By: Junius Argyle, RN Compression Type: Four Layer Post Procedure Diagnosis Same as Pre-procedure Electronic Signature(s) Signed: 12/23/2020 1:09:54 PM By: Donnamarie Poag Entered By: Donnamarie Poag on 12/23/2020 12:00:38 Rodney Oneal ([id number]) -------------------------------------------------------------------------------- Encounter Discharge Information Details Patient Name: Rodney Oneal. Date of Service: 12/23/2020 11:15 AM Medical Record Number: [id number] Patient Account Number: [id number] Date of Birth/Sex: [birth year] (65 y.o. M) Treating RN: Donnamarie Poag Primary Care Provider: Gaetano Net Other Clinician: Referring Provider: Gaetano Net Treating Provider/Extender: Yaakov Guthrie in Treatment: 7 Encounter Discharge Information Items Discharge Condition: Stable Ambulatory Status: Ambulatory Discharge Destination: Home Transportation: Private Auto Accompanied By: wife Schedule Follow-up Appointment: Yes Clinical Summary of Care: Electronic Signature(s) Signed: 12/23/2020 1:09:54 PM By: Donnamarie Poag Entered By: Donnamarie Poag on 12/23/2020 12:11:40 Rodney Oneal ([id number]) -------------------------------------------------------------------------------- Lower Extremity  Assessment Details Patient Name: Rodney Oneal. Date of Service: 12/23/2020 11:15 AM Medical Record Number: [id number] Patient Account Number: [id number] Date of Birth/Sex: 1955/06/19 (65 y.o. M) Treating RN: Donnamarie Poag Primary Care Provider: Gaetano Net Other Clinician: Referring Provider: Gaetano Net Treating Provider/Extender: Yaakov Guthrie in Treatment: 7 Edema Assessment Assessed: Shirlyn Goltz: No] Patrice Paradise: No] [Left: Edema] [Right: :] Calf Left: Right: Point of Measurement: 34 cm From Medial Instep 38 cm Ankle Left: Right: Point of Measurement: 10 cm From Medial Instep 23.5 cm Knee To Floor Left: Right: From Medial Instep 42 cm Vascular Assessment Pulses: Dorsalis Pedis Palpable: [Left:Yes] Electronic Signature(s) Signed: 12/23/2020 1:09:54 PM By: Donnamarie Poag Entered By: Donnamarie Poag on 12/23/2020 11:57:33 Kamrowski, Elyn Aquas ([id number]) -------------------------------------------------------------------------------- Multi Wound Chart Details Patient Name: Rodney Oneal. Date of Service: 12/23/2020 11:15 AM Medical Record Number: [id number] Patient Account Number: [id number] Date of Birth/Sex: [birth year] (65 y.o. M) Treating RN: Donnamarie Poag Primary Care Provider: Gaetano Net Other Clinician: Referring Provider: Gaetano Net Treating Provider/Extender: Yaakov Guthrie in Treatment: 7 Vital Signs Height(in): 71 Pulse(bpm): 80 Weight(lbs): 311 Blood Pressure(mmHg): 146/88 Body Mass Index(BMI): 43 Temperature(F): 98.2 Respiratory Rate(breaths/min): 18 Photos: [N/A:N/A] Wound Location: Left, Medial Lower Leg N/A N/A Wounding Event: Skin Tear/Laceration N/A N/A Primary Etiology: Trauma, Other N/A N/A Comorbid History: Cataracts, Sleep Apnea, N/A N/A Arrhythmia, Coronary Artery Disease, Hypertension, Type II Diabetes, Osteoarthritis Date Acquired: 10/21/2020 N/A N/A Weeks of Treatment: 7 N/A N/A Wound Status: Open N/A N/A Measurements L x W x D (cm)  1.7x0.8x0.3 N/A N/A Area (cm) : 1.068 N/A N/A Volume (cm) : 0.32 N/A N/A % Reduction in Area: 63.70% N/A N/A % Reduction in Volume: -8.50% N/A N/A Classification: Full Thickness Without Exposed N/A N/A Support Structures Exudate Amount: Medium N/A N/A Exudate Type: Serosanguineous N/A N/A Exudate Color: red, brown N/A N/A Granulation Amount: Large (67-100%) N/A N/A Granulation Quality: Red, Pink N/A N/A Necrotic Amount: Small (1-33%) N/A N/A Exposed Structures: Fat Layer (Subcutaneous Tissue): N/A N/A Yes Fascia: No Tendon: No Muscle: No Joint: No Bone: No Epithelialization: None N/A N/A Procedures Performed: Compression Therapy N/A N/A Treatment Notes Wound #1 (Lower Leg) Wound Laterality: Left, Medial Cleanser Soap and Water Discharge Instruction: Gently cleanse wound with antibacterial soap, rinse and pat dry prior to dressing wounds SCHYLER, COUNSELL R. ([id number]) Peri-Wound  Care Topical Santyl Collagenase Ointment, 30 (gm), tube Discharge Instruction: Apply nickel thick to wound bed only Primary Dressing Gauze Discharge Instruction: Dry gauze as bolster on top on hydrofera Hydrofera Blue Ready Transfer Foam, 2.5x2.5 (in/in) Discharge Instruction: Cut Hydrofera Blue Ready, pack in undermining and apply to wound bed Secondary Dressing ABD Pad 5x9 (in/in) Discharge Instruction: Cover with ABD pad Xtrasorb Medium 4x5 (in/in) Discharge Instruction: Apply to wound as directed. Do not cut. Secured With Compression Wrap Medichoice 4 layer Compression System, 35-40 mmHG Discharge Instruction: Apply multi-layer wrap as directed. Compression Stockings Add-Ons Electronic Signature(s) Signed: 12/23/2020 12:20:32 PM By: Kalman Shan DO Entered By: Kalman Shan on 12/23/2020 12:13:35 Rodney Oneal ([id number]) -------------------------------------------------------------------------------- Rafael Gonzalez Details Patient Name: JACOBIE, STAMEY. Date  of Service: 12/23/2020 11:15 AM Medical Record Number: [id number] Patient Account Number: [id number] Date of Birth/Sex: 1956-01-17 (65 y.o. M) Treating RN: Donnamarie Poag Primary Care Provider: Gaetano Net Other Clinician: Referring Provider: Gaetano Net Treating Provider/Extender: Yaakov Guthrie in Treatment: 7 Active Inactive Wound/Skin Impairment Nursing Diagnoses: Impaired tissue integrity Goals: Patient/caregiver will verbalize understanding of skin care regimen Date Initiated: 11/04/2020 Date Inactivated: 12/02/2020 Target Resolution Date: 11/04/2020 Goal Status: Met Ulcer/skin breakdown will have a volume reduction of 30% by week 4 Date Initiated: 11/04/2020 Date Inactivated: 12/16/2020 Target Resolution Date: 12/04/2020 Goal Status: Met Ulcer/skin breakdown will have a volume reduction of 50% by week 8 Date Initiated: 11/04/2020 Target Resolution Date: 01/04/2021 Goal Status: Active Ulcer/skin breakdown will have a volume reduction of 80% by week 12 Date Initiated: 11/04/2020 Target Resolution Date: 02/04/2021 Goal Status: Active Ulcer/skin breakdown will heal within 14 weeks Date Initiated: 11/04/2020 Target Resolution Date: 03/06/2021 Goal Status: Active Interventions: Assess patient/caregiver ability to obtain necessary supplies Assess patient/caregiver ability to perform ulcer/skin care regimen upon admission and as needed Assess ulceration(s) every visit Provide education on ulcer and skin care Treatment Activities: Referred to DME provider for dressing supplies : 11/04/2020 Skin care regimen initiated : 11/04/2020 Notes: Electronic Signature(s) Signed: 12/23/2020 1:09:54 PM By: Donnamarie Poag Entered By: Donnamarie Poag on 12/23/2020 11:57:49 Rodney Oneal ([id number]) -------------------------------------------------------------------------------- Pain Assessment Details Patient Name: Rodney Oneal. Date of Service: 12/23/2020 11:15 AM Medical Record Number:  [id number] Patient Account Number: [id number] Date of Birth/Sex: [birth year] (65 y.o. M) Treating RN: Donnamarie Poag Primary Care Provider: Gaetano Net Other Clinician: Referring Provider: Gaetano Net Treating Provider/Extender: Yaakov Guthrie in Treatment: 7 Active Problems Location of Pain Severity and Description of Pain Patient Has Paino No Site Locations Rate the pain. Current Pain Level: 0 Pain Management and Medication Current Pain Management: Electronic Signature(s) Signed: 12/23/2020 1:09:54 PM By: Donnamarie Poag Entered By: Donnamarie Poag on 12/23/2020 11:53:19 Rodney Oneal ([id number]) -------------------------------------------------------------------------------- Patient/Caregiver Education Details Patient Name: Rodney Oneal. Date of Service: 12/23/2020 11:15 AM Medical Record Number: [id number] Patient Account Number: [id number] Date of Birth/Gender: 06/27/1955 (65 y.o. M) Treating RN: Donnamarie Poag Primary Care Physician: Gaetano Net Other Clinician: Referring Physician: Gaetano Net Treating Physician/Extender: Yaakov Guthrie in Treatment: 7 Education Assessment Education Provided To: Patient Education Topics Provided Wound/Skin Impairment: Electronic Signature(s) Signed: 12/23/2020 1:09:54 PM By: Donnamarie Poag Entered By: Donnamarie Poag on 12/23/2020 12:11:02 Rodney Oneal ([id number]) -------------------------------------------------------------------------------- Wound Assessment Details Patient Name: JEFFREE, CAZEAU. Date of Service: 12/23/2020 11:15 AM Medical Record Number: [id number] Patient Account Number: [id number] Date of Birth/Sex: Sep 23, 1955 (65 y.o. M) Treating RN: Donnamarie Poag Primary Care Provider: Gaetano Net Other Clinician: Referring Provider: Gaetano Net Treating Provider/Extender: Yaakov Guthrie in  Treatment: 7 Wound Status Wound Number: 1 Primary Trauma, Other Etiology: Wound Location: Left, Medial Lower Leg Wound  Open Wounding Event: Skin Tear/Laceration Status: Date Acquired: 10/21/2020 Comorbid Cataracts, Sleep Apnea, Arrhythmia, Coronary Artery Weeks Of Treatment: 7 History: Disease, Hypertension, Type II Diabetes, Osteoarthritis Clustered Wound: No Photos Wound Measurements Length: (cm) 1.7 Width: (cm) 0.8 Depth: (cm) 0.3 Area: (cm) 1.068 Volume: (cm) 0.32 % Reduction in Area: 63.7% % Reduction in Volume: -8.5% Epithelialization: None Tunneling: No Undermining: No Wound Description Classification: Full Thickness Without Exposed Support Structures Exudate Amount: Medium Exudate Type: Serosanguineous Exudate Color: red, brown Foul Odor After Cleansing: No Slough/Fibrino No Wound Bed Granulation Amount: Large (67-100%) Exposed Structure Granulation Quality: Red, Pink Fascia Exposed: No Necrotic Amount: Small (1-33%) Fat Layer (Subcutaneous Tissue) Exposed: Yes Necrotic Quality: Adherent Slough Tendon Exposed: No Muscle Exposed: No Joint Exposed: No Bone Exposed: No Treatment Notes Wound #1 (Lower Leg) Wound Laterality: Left, Medial Cleanser Soap and Water Discharge Instruction: Gently cleanse wound with antibacterial soap, rinse and pat dry prior to dressing wounds Peri-Wound Care JAQUARI, RECKNER ([id number]) Topical Santyl Collagenase Ointment, 30 (gm), tube Discharge Instruction: Apply nickel thick to wound bed only Primary Dressing Gauze Discharge Instruction: Dry gauze as bolster on top on hydrofera Hydrofera Blue Ready Transfer Foam, 2.5x2.5 (in/in) Discharge Instruction: Cut Hydrofera Blue Ready, pack in undermining and apply to wound bed Secondary Dressing ABD Pad 5x9 (in/in) Discharge Instruction: Cover with ABD pad Xtrasorb Medium 4x5 (in/in) Discharge Instruction: Apply to wound as directed. Do not cut. Secured With Compression Wrap Medichoice 4 layer Compression System, 35-40 mmHG Discharge Instruction: Apply multi-layer wrap as directed. Compression  Stockings Add-Ons Electronic Signature(s) Signed: 12/23/2020 1:09:54 PM By: Donnamarie Poag Entered By: Donnamarie Poag on 12/23/2020 11:57:06 Rodney Oneal ([id number]) -------------------------------------------------------------------------------- [place] Details Patient Name: Rodney Oneal. Date of Service: 12/23/2020 11:15 AM Medical Record Number: [id number] Patient Account Number: [id number] Date of Birth/Sex: August 23, 1955 (65 y.o. M) Treating RN: Donnamarie Poag Primary Care Provider: Gaetano Net Other Clinician: Referring Provider: Gaetano Net Treating Provider/Extender: Yaakov Guthrie in Treatment: 7 Vital Signs Time Taken: 11:51 Temperature (F): 98.2 Height (in): 71 Pulse (bpm): 80 Weight (lbs): 311 Respiratory Rate (breaths/min): 18 Body Mass Index (BMI): 43.4 Blood Pressure (mmHg): 146/88 Reference Range: 80 - 120 mg / dl Electronic Signature(s) Signed: 12/23/2020 1:09:54 PM By: Donnamarie Poag Entered ByDonnamarie Poag on 12/23/2020 11:53:13

## 2020-12-30 ENCOUNTER — Other Ambulatory Visit: Payer: Self-pay

## 2020-12-30 ENCOUNTER — Encounter: Payer: Medicare Other | Attending: Internal Medicine | Admitting: Internal Medicine

## 2020-12-30 DIAGNOSIS — W06XXXD Fall from bed, subsequent encounter: Secondary | ICD-10-CM | POA: Diagnosis not present

## 2020-12-30 DIAGNOSIS — W2209XD Striking against other stationary object, subsequent encounter: Secondary | ICD-10-CM | POA: Insufficient documentation

## 2020-12-30 DIAGNOSIS — E11622 Type 2 diabetes mellitus with other skin ulcer: Secondary | ICD-10-CM | POA: Insufficient documentation

## 2020-12-30 DIAGNOSIS — Z7901 Long term (current) use of anticoagulants: Secondary | ICD-10-CM | POA: Insufficient documentation

## 2020-12-30 DIAGNOSIS — T45515S Adverse effect of anticoagulants, sequela: Secondary | ICD-10-CM

## 2020-12-30 DIAGNOSIS — L97828 Non-pressure chronic ulcer of other part of left lower leg with other specified severity: Secondary | ICD-10-CM | POA: Diagnosis not present

## 2020-12-30 DIAGNOSIS — S8012XD Contusion of left lower leg, subsequent encounter: Secondary | ICD-10-CM | POA: Diagnosis not present

## 2020-12-30 DIAGNOSIS — I482 Chronic atrial fibrillation, unspecified: Secondary | ICD-10-CM | POA: Diagnosis not present

## 2020-12-30 DIAGNOSIS — Z8673 Personal history of transient ischemic attack (TIA), and cerebral infarction without residual deficits: Secondary | ICD-10-CM | POA: Diagnosis not present

## 2020-12-30 NOTE — Progress Notes (Signed)
LENNEX, PIETILA ([id number]) Visit Report for 12/30/2020 Chief Complaint Document Details Patient Name: Rodney Oneal, Rodney Oneal. Date of Service: 12/30/2020 11:00 AM Medical Record Number: [id number] Patient Account Number: [id number] Date of Birth/Sex: Jan 18, 1956 (65 y.o. M) Treating RN: Hansel Feinstein Primary Care Provider: Lenon Oms Other Clinician: Referring Provider: Lenon Oms Treating Provider/Extender: Tilda Franco in Treatment: 8 Information Obtained from: Patient Chief Complaint 11/04/2020; patient is here for review of a hematoma on his left lower leg Electronic Signature(s) Signed: 12/30/2020 12:30:51 PM By: Geralyn Corwin DO Entered By: Geralyn Corwin on 12/30/2020 12:27:25 Rodney Oneal ([id number]) -------------------------------------------------------------------------------- HPI Details Patient Name: Rodney Oneal. Date of Service: 12/30/2020 11:00 AM Medical Record Number: [id number] Patient Account Number: [id number] Date of Birth/Sex: 05/25/1956 (65 y.o. M) Treating RN: Hansel Feinstein Primary Care Provider: Lenon Oms Other Clinician: Referring Provider: Lenon Oms Treating Provider/Extender: Tilda Franco in Treatment: 8 History of Present Illness HPI Description: ADMISSION 11/04/2020 This is a 65 year old man who is on chronic Coumadin secondary to atrial fibrillation. He had a fall on 5/25 out of bed but he managed to hit his anterior left leg on a tool that was lying in the vicinity. He was seen at his primary doctor's office on 10/30/2020 and there was a concern for cellulitis he was put on cephalexin 500 3 times daily that he still taking. He currently has a significant swelling with some nonviable skin on top of it. He says this is begin leaking but it is not frankly open. He is not complaining of a lot of pain. Past medical history includes type 2 diabetes, chronic A. fib on Coumadin, hypertension, history of a CVA ABI in this clinic was 0.92  on the left 6/15 left medial lower leg hematoma secondary to trauma on Coumadin. The patient arrives back in clinic with a week of 4-layer compression with calcium alginate with the area on his left medial lower leg just above the ankle looking completely the same. No evidence that this is reabsorbing. Still very tight. I do not see any evidence of infection however I was hoping for some improvement in the amount of periwound swelling. 6/22; left medial lower leg hematoma secondary to trauma in a patient on chronic Coumadin. Last week I made the decision to open this. He had quite a bit of undermining in the deep wound. We use silver alginate. Really dramatic improvement in the undermining although the wound is still deep. 6/29; patient presents for 1 week follow-up. He has been using Hydrofera Blue under 4-layer compression and tolerating this well. He denies signs of infection. 7/6; patient presents for 1 week follow-up. He has been using Hydrofera Blue under 4-layer compression and tolerating this well. He denies signs of infection. He has no issues or complaints today. 7/13; Patient presents for 1 week follow-up. He has been using Hydrofera Blue under 4-layer compression and doing well with this. He denies signs of infection. He has no issues or complaints today. 7/20; patient presents for 1 week follow-up. We have been using 4-layer compression with Hydrofera Blue underneath. He denies signs of infection. He did notice some slight irritation over the front of his left foot and lower leg from the wrap. Overall he is doing well and reports progress to the wound healing. 7/27; patient presents for 1 week follow-up. He has been tolerating the compression wrap well. He reports slight irritation to his toes with itching. Overall he denies signs of infection and is doing well. 8/3; patient presents for  1 week follow-up. He reports no issues or complaints today. He no longer has itching to his toes.  He denies signs of infection. Electronic Signature(s) Signed: 12/30/2020 12:30:51 PM By: Geralyn CorwinHoffman, Teirra Carapia DO Entered By: Geralyn CorwinHoffman, Miria Cappelli on 12/30/2020 12:28:04 Rodney Oneal, Rodney R. ([id number]) -------------------------------------------------------------------------------- Physical Exam Details Patient Name: Rodney Oneal, Rodney R. Date of Service: 12/30/2020 11:00 AM Medical Record Number: [id number] Patient Account Number: [id number] Date of Birth/Sex: 10/06/1955 (65 y.o. M) Treating RN: Hansel FeinsteinBishop, Joy Primary Care Provider: Lenon OmsGauger, Sarah Other Clinician: Referring Provider: Lenon OmsGauger, Sarah Treating Provider/Extender: Tilda FrancoHoffman, Felicidad Sugarman Weeks in Treatment: 8 Constitutional . Cardiovascular . Psychiatric . Notes Left lower extremity: Epithelialization to the previous wound site. No open wounds. No skin irritation to his toes. No signs of infection. Electronic Signature(s) Signed: 12/30/2020 12:30:51 PM By: Geralyn CorwinHoffman, Marilouise Densmore DO Entered By: Geralyn CorwinHoffman, Ramata Strothman on 12/30/2020 12:28:39 Rodney Oneal, Rodney R. ([id number]) -------------------------------------------------------------------------------- Physician Orders Details Patient Name: Rodney Oneal, Rodney R. Date of Service: 12/30/2020 11:00 AM Medical Record Number: [id number] Patient Account Number: [id number] Date of Birth/Sex: 12/06/1955 (65 y.o. M) Treating RN: Hansel FeinsteinBishop, Joy Primary Care Provider: Lenon OmsGauger, Sarah Other Clinician: Referring Provider: Lenon OmsGauger, Sarah Treating Provider/Extender: Tilda FrancoHoffman, Marko Skalski Weeks in Treatment: 8 Verbal / Phone Orders: No Diagnosis Coding [facility] o Discharge from W[facility] Care Center Treatment Complete - scheduled for 8/17 and if not needed call to cancel o Elevate, Exercise Daily and Avoid Standing for Long Periods of Time. o DO YOUR BEST to sleep in the bed at night. DO NOT sleep in your recliner. Long hours of sitting in a recliner leads to swelling of the legs and/or potential wounds on your  backside. Follow-up Appointments o Other: - If anything opened up then call to be reseen, otherwise you don't need appt Additional Orders / Instructions o Other: - shower as usual-use moisturizer after bathing-may apply antibiotic ointment to areas post wound Wound Treatment Electronic Signature(s) Signed: 12/30/2020 12:30:51 PM By: Geralyn CorwinHoffman, Jnai Snellgrove DO Signed: 12/30/2020 3:01:59 PM By: Hansel FeinsteinBishop, Joy Entered By: Hansel FeinsteinBishop, Joy on 12/30/2020 12:02:15 Rodney Oneal, Rodney R. ([id number]) -------------------------------------------------------------------------------- Problem List Details Patient Name: Rodney Oneal, Rodney R. Date of Service: 12/30/2020 11:00 AM Medical Record Number: [id number] Patient Account Number: [id number] Date of Birth/Sex: 01/11/1956 (65 y.o. M) Treating RN: Hansel FeinsteinBishop, Joy Primary Care Provider: Lenon OmsGauger, Sarah Other Clinician: Referring Provider: Lenon OmsGauger, Sarah Treating Provider/Extender: Tilda FrancoHoffman, Eleno Weimar Weeks in Treatment: 8 Active Problems ICD-10 Encounter Code Description Active Date MDM Diagnosis S80.12XD Contusion of left lower leg, subsequent encounter 11/04/2020 No Yes 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 11/04/2020 No Yes specified severity T45.515S Adverse effect of anticoagulants, sequela 11/04/2020 No Yes Inactive Problems Resolved Problems Electronic Signature(s) Signed: 12/30/2020 12:30:51 PM By: Geralyn CorwinHoffman, Yohance Hathorne DO Entered By: Geralyn CorwinHoffman, Rowene Suto on 12/30/2020 12:27:07 Rodney Oneal, Miller R. ([id number]) -------------------------------------------------------------------------------- Progress Note Details Patient Name: Rodney Oneal, Rodney R. Date of Service: 12/30/2020 11:00 AM Medical Record Number: [id number] Patient Account Number: [id number] Date of Birth/Sex: 03/12/1956 (65 y.o. M) Treating RN: Hansel FeinsteinBishop, Joy Primary Care Provider: Lenon OmsGauger, Sarah Other Clinician: Referring Provider: Lenon OmsGauger, Sarah Treating Provider/Extender: Tilda FrancoHoffman, Malala Trenkamp Weeks in  Treatment: 8 Subjective Chief Complaint Information obtained from Patient 11/04/2020; patient is here for review of a hematoma on his left lower leg History of Present Illness (HPI) ADMISSION 11/04/2020 This is a 65 year old man who is on chronic Coumadin secondary to atrial fibrillation. He had a fall on 5/25 out of bed but he managed to hit his anterior left leg on a tool that was lying in the vicinity. He was  seen at his primary doctor's office on 10/30/2020 and there was a concern for cellulitis he was put on cephalexin 500 3 times daily that he still taking. He currently has a significant swelling with some nonviable skin on top of it. He says this is begin leaking but it is not frankly open. He is not complaining of a lot of pain. Past medical history includes type 2 diabetes, chronic A. fib on Coumadin, hypertension, history of a CVA ABI in this clinic was 0.92 on the left 6/15 left medial lower leg hematoma secondary to trauma on Coumadin. The patient arrives back in clinic with a week of 4-layer compression with calcium alginate with the area on his left medial lower leg just above the ankle looking completely the same. No evidence that this is reabsorbing. Still very tight. I do not see any evidence of infection however I was hoping for some improvement in the amount of periwound swelling. 6/22; left medial lower leg hematoma secondary to trauma in a patient on chronic Coumadin. Last week I made the decision to open this. He had quite a bit of undermining in the deep wound. We use silver alginate. Really dramatic improvement in the undermining although the wound is still deep. 6/29; patient presents for 1 week follow-up. He has been using Hydrofera Blue under 4-layer compression and tolerating this well. He denies signs of infection. 7/6; patient presents for 1 week follow-up. He has been using Hydrofera Blue under 4-layer compression and tolerating this well. He denies signs of infection.  He has no issues or complaints today. 7/13; Patient presents for 1 week follow-up. He has been using Hydrofera Blue under 4-layer compression and doing well with this. He denies signs of infection. He has no issues or complaints today. 7/20; patient presents for 1 week follow-up. We have been using 4-layer compression with Hydrofera Blue underneath. He denies signs of infection. He did notice some slight irritation over the front of his left foot and lower leg from the wrap. Overall he is doing well and reports progress to the wound healing. 7/27; patient presents for 1 week follow-up. He has been tolerating the compression wrap well. He reports slight irritation to his toes with itching. Overall he denies signs of infection and is doing well. 8/3; patient presents for 1 week follow-up. He reports no issues or complaints today. He no longer has itching to his toes. He denies signs of infection. Patient History Information obtained from Patient. Social History Former smoker - ended on 05/30/2013, Marital Status - Married, Alcohol Use - Never, Drug Use - No History, Caffeine Use - Rarely. Medical History Eyes Patient has history of Cataracts - surgery Respiratory Patient has history of Sleep Apnea Cardiovascular Patient has history of Arrhythmia - afib, Coronary Artery Disease, Hypertension Endocrine Patient has history of Type II Diabetes Musculoskeletal Patient has history of Osteoarthritis Rodney Oneal, SKORUPSKI R. ([id number]) Objective Constitutional Vitals Time Taken: 11:00 AM, Height: 71 in, Weight: 311 lbs, BMI: 43.4, Temperature: 98.3 F, Pulse: 74 bpm, Respiratory Rate: 18 breaths/min, Blood Pressure: 126/75 mmHg. General Notes: Left lower extremity: Epithelialization to the previous wound site. No open wounds. No skin irritation to his toes. No signs of infection. Integumentary (Hair, Skin) Wound #1 status is Open. Original cause of wound was Skin Tear/Laceration. The date acquired  was: 10/21/2020. The wound has been in treatment 8 weeks. The wound is located on the Left,Medial Lower Leg. The wound measures 1cm length x 0.3cm width x 0.1cm depth; 0.236cm^2 area  and 0.024cm^3 volume. There is Fat Layer (Subcutaneous Tissue) exposed. There is no tunneling or undermining noted. There is a none present amount of drainage noted. There is no granulation within the wound bed. There is a large (67-100%) amount of necrotic tissue within the wound bed including Adherent Slough. Assessment Active Problems ICD-10 Contusion of left lower leg, subsequent encounter Non-pressure chronic ulcer of other part of left lower leg with other specified severity Adverse effect of anticoagulants, sequela Patient has done well with 4-layer compression and Hydrofera Blue. His wound is healed today. I asked him to keep the area covered with antibiotic ointment for the next week. He can follow-up as needed. Plan Discharge From [facility]: Discharge from Wound Care Center Treatment Complete - scheduled for 8/17 and if not needed call to cancel Elevate, Exercise Daily and Avoid Standing for Long Periods of Time. DO YOUR BEST to sleep in the bed at night. DO NOT sleep in your recliner. Long hours of sitting in a recliner leads to swelling of the legs and/or potential wounds on your backside. Follow-up Appointments: Other: - If anything opened up then call to be reseen, otherwise you don't need appt Additional Orders / Instructions: Other: - shower as usual-use moisturizer after bathing-may apply antibiotic ointment to areas post wound 1. Discharge from clinic due to closed wound 2. Keep covered with antibiotic ointment for the next week 3. Follow-up as needed Electronic Signature(s) Signed: 12/30/2020 12:30:51 PM By: Geralyn Corwin DO Entered By: Geralyn Corwin on 12/30/2020 12:30:06 Rodney Oneal  ([id number]) -------------------------------------------------------------------------------- ROS/PFSH Details Patient Name: DENIRO, LAYMON. Date of Service: 12/30/2020 11:00 AM Medical Record Number: [id number] Patient Account Number: [id number] Date of Birth/Sex: 1955/08/08 (65 y.o. M) Treating RN: Hansel Feinstein Primary Care Provider: Lenon Oms Other Clinician: Referring Provider: Lenon Oms Treating Provider/Extender: Tilda Franco in Treatment: 8 Information Obtained From Patient Eyes Medical History: Positive for: Cataracts - surgery Respiratory Medical History: Positive for: Sleep Apnea Cardiovascular Medical History: Positive for: Arrhythmia - afib; Coronary Artery Disease; Hypertension Endocrine Medical History: Positive for: Type II Diabetes Time with diabetes: 2002 Treated with: Insulin, Oral agents Blood sugar tested every day: No Musculoskeletal Medical History: Positive for: Osteoarthritis HBO Extended History Items Eyes: Cataracts Immunizations Pneumococcal Vaccine: Received Pneumococcal Vaccination: Yes Received Pneumococcal Vaccination On or After 60th Birthday: No Implantable Devices None Family and Social History Former smoker - ended on 05/30/2013; Marital Status - Married; Alcohol Use: Never; Drug Use: No History; Caffeine Use: Rarely; Financial Concerns: No; Food, Clothing or Shelter Needs: No; Support System Lacking: No; Transportation Concerns: No Electronic Signature(s) Signed: 12/30/2020 12:30:51 PM By: Geralyn Corwin DO Signed: 12/30/2020 3:01:59 PM By: Hansel Feinstein Entered By: Geralyn Corwin on 12/30/2020 12:28:11 AJENE, CARCHI ([id number]) -------------------------------------------------------------------------------- SuperBill Details Patient Name: TARANCE, BALAN. Date of Service: 12/30/2020 Medical Record Number: [id number] Patient Account Number: [id number] Date of Birth/Sex: August 16, 1955 (65 y.o. M) Treating RN: Hansel Feinstein Primary Care Provider: Lenon Oms Other Clinician: Referring Provider: Lenon Oms Treating Provider/Extender: Tilda Franco in Treatment: 8 Diagnosis Coding ICD-10 Codes Code Description [phone] Contusion of left lower leg, subsequent encounter 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 specified severity T45.515S Adverse effect of anticoagulants, sequela Facility Procedures CPT4 Code: [id number] Description: [phone] - WOUND CARE VISIT-LEV 2 EST PT Modifier: Quantity: 1 Physician Procedures CPT4 Code: [id number] Description: [id number] - WC PHYS LEVEL 3 - EST PT Modifier: Quantity: 1 CPT4 Code: Description: ICD-10 Diagnosis Description S80.12XD Contusion of  left lower leg, subsequent encounter 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 spe T45.515S Adverse effect of anticoagulants, sequela Modifier: cified severity Quantity: Electronic Signature(s) Signed: 12/30/2020 12:30:51 PM By: Geralyn Corwin DO Entered By: Geralyn Corwin on 12/30/2020 12:30:20

## 2020-12-30 NOTE — Progress Notes (Signed)
MERREL, CRABBE ([id number]) Visit Report for 12/30/2020 Arrival Information Details Patient Name: Rodney Oneal, Rodney Oneal. Date of Service: 12/30/2020 11:00 AM Medical Record Number: [id number] Patient Account Number: [id number] Date of Birth/Sex: [birth year] (65 y.o. M) Treating RN: Hansel Feinstein Primary Care Momoko Slezak: Lenon Oms Other Clinician: Referring Anajulia Leyendecker: Lenon Oms Treating Daril Warga/Extender: Tilda Franco in Treatment: 8 Visit Information History Since Last Visit Added or deleted any medications: No Patient Arrived: Ambulatory Had a fall or experienced change in Yes Arrival Time: 10:58 activities of daily living that may affect Accompanied By: wife risk of falls: Transfer Assistance: None Hospitalized since last visit: No Patient Identification Verified: Yes Has Dressing in Place as Prescribed: Yes Secondary Verification Process Completed: Yes Has Compression in Place as Prescribed: Yes Patient Requires Transmission-Based No Pain Present Now: No Precautions: Patient Has Alerts: Yes Patient Alerts: Patient on Blood Thinner DIABETIC COUMADIN Electronic Signature(s) Signed: 12/30/2020 3:01:59 PM By: Hansel Feinstein Entered By: Hansel Feinstein on 12/30/2020 11:06:45 Rodney Oneal ([id number]) -------------------------------------------------------------------------------- Clinic Level of Care Assessment Details Patient Name: Rodney Oneal. Date of Service: 12/30/2020 11:00 AM Medical Record Number: [id number] Patient Account Number: [id number] Date of Birth/Sex: [birth year] (65 y.o. M) Treating RN: Hansel Feinstein Primary Care Pihu Basil: Lenon Oms Other Clinician: Referring Taima Rada: Lenon Oms Treating Shaketta Rill/Extender: Tilda Franco in Treatment: 8 Clinic Level of Care Assessment Items TOOL 4 Quantity Score []  - Use when only an EandM is performed on FOLLOW-UP visit 0 ASSESSMENTS - Nursing Assessment / Reassessment []  - Reassessment of Co-morbidities  (includes updates in patient status) 0 []  - 0 Reassessment of Adherence to Treatment Plan ASSESSMENTS - Wound and Skin Assessment / Reassessment X - Simple Wound Assessment / Reassessment - one wound 1 5 []  - 0 Complex Wound Assessment / Reassessment - multiple wounds []  - 0 Dermatologic / Skin Assessment (not related to wound area) ASSESSMENTS - Focused Assessment []  - Circumferential Edema Measurements - multi extremities 0 []  - 0 Nutritional Assessment / Counseling / Intervention []  - 0 Lower Extremity Assessment (monofilament, tuning fork, pulses) []  - 0 Peripheral Arterial Disease Assessment (using hand held doppler) ASSESSMENTS - Ostomy and/or Continence Assessment and Care []  - Incontinence Assessment and Management 0 []  - 0 Ostomy Care Assessment and Management (repouching, etc.) PROCESS - Coordination of Care X - Simple Patient / Family Education for ongoing care 1 15 []  - 0 Complex (extensive) Patient / Family Education for ongoing care []  - 0 Staff obtains , Records, Test Results / Process Orders []  - 0 Staff telephones HHA, Nursing Homes / Clarify orders / etc []  - 0 Routine Transfer to another Facility (non-emergent condition) []  - 0 Routine Hospital Admission (non-emergent condition) []  - 0 New Admissions / / Ordering NPWT, Apligraf, etc. []  - 0 Emergency Hospital Admission (emergent condition) X- 1 10 Simple Discharge Coordination []  - 0 Complex (extensive) Discharge Coordination PROCESS - Special Needs []  - Pediatric / Minor Patient Management 0 []  - 0 Isolation Patient Management []  - 0 Hearing / Language / Visual special needs []  - 0 Assessment of Community assistance (transportation, D/C planning, etc.) []  - 0 Additional assistance / Altered mentation []  - 0 Support Surface(s) Assessment (bed, cushion, seat, etc.) INTERVENTIONS - Wound Cleansing / Measurement Rodney Oneal, Rodney R. ( ) X- 1 5 Simple Wound  Cleansing - one wound []  - 0 Complex Wound Cleansing - multiple wounds []  - 0 Wound Imaging (photographs - any number of wounds) []  - 0 Wound Tracing (instead of photographs) X-  1 5 Simple Wound Measurement - one wound []  - 0 Complex Wound Measurement - multiple wounds INTERVENTIONS - Wound Dressings []  - Small Wound Dressing one or multiple wounds 0 []  - 0 Medium Wound Dressing one or multiple wounds []  - 0 Large Wound Dressing one or multiple wounds []  - 0 Application of Medications - topical []  - 0 Application of Medications - injection INTERVENTIONS - Miscellaneous []  - External ear exam 0 []  - 0 Specimen Collection (cultures, biopsies, blood, body fluids, etc.) []  - 0 Specimen(s) / Culture(s) sent or taken to Lab for analysis []  - 0 Patient Transfer (multiple staff / / Similar devices) []  - 0 Simple Staple / Suture removal (25 or less) []  - 0 Complex Staple / Suture removal (26 or more) []  - 0 Hypo / Hyperglycemic Management (close monitor of Blood Glucose) []  - 0 Ankle / Brachial Index (ABI) - do not check if billed separately X- 1 5 Vital Signs Has the patient been seen at the hospital within the last three years: Yes Total Score: 45 Level Of Care: New/Established - Level 2 Electronic Signature(s) Signed: 12/30/2020 3:01:59 PM By: Entered By: on 12/30/2020 12:03:45 ( ) -------------------------------------------------------------------------------- Encounter Discharge Information Details Patient Name: Rodney Oneal, Rodney R. Date of Service: 12/30/2020 11:00 AM Medical Record Number: Patient Account Number: Nurse, adult Date of Birth/Sex: 09-12-1955 (65 y.o. M) Treating RN: Primary Care Tannar Broker: Other Clinician: Referring Rudy Luhmann: 03/01/2021 Treating Jamicheal Heard/Extender: Hansel Feinstein in Treatment: 8 Encounter Discharge Information Items Discharge Condition:  Stable Ambulatory Status: Ambulatory Discharge Destination: Home Transportation: Private Auto Accompanied By: wife Schedule Follow-up Appointment: Yes Clinical Summary of Care: Electronic Signature(s) Signed: 12/30/2020 3:01:59 PM By: 03/01/2021 Entered By: Rodney Oneal on 12/30/2020 12:04:37 Orene Desanctis (03/01/2021) -------------------------------------------------------------------------------- Lower Extremity Assessment Details Patient Name: Rodney Oneal, FAULK. Date of Service: 12/30/2020 11:00 AM Medical Record Number: 10/03/1955 Patient Account Number: [id number] Date of Birth/Sex: 03/02/1956 (65 y.o. M) Treating RN: Lenon Oms Primary Care Adaysha Dubinsky: Tilda Franco Other Clinician: Referring Janelle Spellman: 03/01/2021 Treating Belky Mundo/Extender: Hansel Feinstein in Treatment: 8 Edema Assessment Assessed: Hansel Feinstein: Yes] [Right: No] Edema: [Left: N] [Right: o] Calf Left: Right: Point of Measurement: 34 cm From Medial Instep 38 cm Ankle Left: Right: Point of Measurement: 10 cm From Medial Instep 23.5 cm Electronic Signature(s) Signed: 12/30/2020 3:01:59 PM By: Rodney Oneal Entered By: [id number] on 12/30/2020 11:08:21 03/01/2021 ([id number]) -------------------------------------------------------------------------------- Multi Wound Chart Details Patient Name: [id number]. Date of Service: 12/30/2020 11:00 AM Medical Record Number: [id number] Patient Account Number: Hansel Feinstein Date of Birth/Sex: [birth year] (65 y.o. M) Treating RN: Tilda Franco Primary Care Cailen Mihalik: Kyra Searles Other Clinician: Referring Taren Dymek: 03/01/2021 Treating Shakenna Herrero/Extender: Hansel Feinstein in Treatment: 8 Vital Signs Height(in): 71 Pulse(bpm): 74 Weight(lbs): 311 Blood Pressure(mmHg): 126/75 Body Mass Index(BMI): 43 Temperature(F): 98.3 Respiratory Rate(breaths/min): 18 Photos: [N/A:N/A] Wound Location: Left, Medial Lower Leg N/A N/A Wounding Event: Skin Tear/Laceration  N/A N/A Primary Etiology: Trauma, Other N/A N/A Comorbid History: Cataracts, Sleep Apnea, N/A N/A Arrhythmia, Coronary Artery Disease, Hypertension, Type II Diabetes, Osteoarthritis Date Acquired: 10/21/2020 N/A N/A Weeks of Treatment: 8 N/A N/A Wound Status: Open N/A N/A Measurements L x W x D (cm) 1x0.3x0.1 N/A N/A Area (cm) : 0.236 N/A N/A Volume (cm) : 0.024 N/A N/A % Reduction in Area: 92.00% N/A N/A % Reduction in Volume: 91.90% N/A N/A Classification: Full Thickness Without Exposed N/A N/A Support Structures Exudate  Amount: None Present N/A N/A Granulation Amount: None Present (0%) N/A N/A Necrotic Amount: Large (67-100%) N/A N/A Exposed Structures: Fat Layer (Subcutaneous Tissue): N/A N/A Yes Fascia: No Tendon: No Muscle: No Joint: No Bone: No Epithelialization: Small (1-33%) N/A N/A Treatment Notes Wound #1 (Lower Leg) Wound Laterality: Left, Medial Cleanser Peri-Wound Care Topical Primary Dressing Secondary Dressing Rodney IdolRKER, Inmer R. ([id number]) Secured With Compression Wrap Compression Stockings Add-Ons Electronic Signature(s) Signed: 12/30/2020 12:30:51 PM By: Geralyn CorwinHoffman, Jessica DO Entered By: Geralyn CorwinHoffman, Jessica on 12/30/2020 12:27:14 Rodney IdolPARKER, Rodney R. ([id number]) -------------------------------------------------------------------------------- Multi-Disciplinary Care Plan Details Patient Name: Rodney IdolRKER, Lukah R. Date of Service: 12/30/2020 11:00 AM Medical Record Number: [id number] Patient Account Number: [id number] Date of Birth/Sex: 02/27/1956 (65 y.o. M) Treating RN: Hansel FeinsteinBishop, Joy Primary Care Lezlee Gills: Lenon OmsGauger, Sarah Other Clinician: Referring Troy Hartzog: Lenon OmsGauger, Sarah Treating Errol Ala/Extender: Tilda FrancoHoffman, Jessica Weeks in Treatment: 8 Active Inactive Electronic Signature(s) Signed: 12/30/2020 1:54:13 PM By: Hansel FeinsteinBishop, Joy Entered By: Hansel FeinsteinBishop, Joy on 12/30/2020 13:54:13 Rodney IdolPARKER, Rodney R.  ([id number]) -------------------------------------------------------------------------------- Pain Assessment Details Patient Name: Rodney IdolRKER, Nakota R. Date of Service: 12/30/2020 11:00 AM Medical Record Number: [id number] Patient Account Number: [id number] Date of Birth/Sex: 09/07/1955 (65 y.o. M) Treating RN: Hansel FeinsteinBishop, Joy Primary Care Amen Staszak: Lenon OmsGauger, Sarah Other Clinician: Referring Layani Foronda: Lenon OmsGauger, Sarah Treating Avaya Mcjunkins/Extender: Tilda FrancoHoffman, Jessica Weeks in Treatment: 8 Active Problems Location of Pain Severity and Description of Pain Patient Has Paino No Site Locations Rate the pain. Current Pain Level: 0 Pain Management and Medication Current Pain Management: Electronic Signature(s) Signed: 12/30/2020 3:01:59 PM By: Hansel FeinsteinBishop, Joy Entered By: Hansel FeinsteinBishop, Joy on 12/30/2020 11:01:48 Rodney IdolPARKER, Rodney R. ([id number]) -------------------------------------------------------------------------------- Patient/Caregiver Education Details Patient Name: Rodney IdolPARKER, Rodney R. Date of Service: 12/30/2020 11:00 AM Medical Record Number: [id number] Patient Account Number: [id number] Date of Birth/Gender: 04/04/1956 (65 y.o. M) Treating RN: Hansel FeinsteinBishop, Joy Primary Care Physician: Lenon OmsGauger, Sarah Other Clinician: Referring Physician: Lenon OmsGauger, Sarah Treating Physician/Extender: Tilda FrancoHoffman, Jessica Weeks in Treatment: 8 Education Assessment Education Provided To: Patient Education Topics Provided Basic Hygiene: Wound/Skin Impairment: Electronic Signature(s) Signed: 12/30/2020 3:01:59 PM By: Hansel FeinsteinBishop, Joy Entered By: Hansel FeinsteinBishop, Joy on 12/30/2020 11:09:00 Rodney IdolPARKER, Rodney R. ([id number]) -------------------------------------------------------------------------------- Wound Assessment Details Patient Name: Rodney IdolRKER, Rodney R. Date of Service: 12/30/2020 11:00 AM Medical Record Number: [id number] Patient Account Number: [id number] Date of Birth/Sex: 02/16/1956 (65 y.o. M) Treating RN: Hansel FeinsteinBishop, Joy Primary Care Ornella Coderre: Lenon OmsGauger, Sarah Other  Clinician: Referring Myran Arcia: Lenon OmsGauger, Sarah Treating Dell Hurtubise/Extender: Tilda FrancoHoffman, Jessica Weeks in Treatment: 8 Wound Status Wound Number: 1 Primary Trauma, Other Etiology: Wound Location: Left, Medial Lower Leg Wound Healed - Epithelialized Wounding Event: Skin Tear/Laceration Status: Date Acquired: 10/21/2020 Comorbid Cataracts, Sleep Apnea, Arrhythmia, Coronary Artery Weeks Of Treatment: 8 History: Disease, Hypertension, Type II Diabetes, Osteoarthritis Clustered Wound: No Photos Wound Measurements Length: (cm) 0 Width: (cm) 0 Depth: (cm) 0 Area: (cm) 0 Volume: (cm) 0 % Reduction in Area: 100% % Reduction in Volume: 100% Epithelialization: Small (1-33%) Tunneling: No Undermining: No Wound Description Classification: Full Thickness Without Exposed Support Structures Exudate Amount: None Present Foul Odor After Cleansing: No Slough/Fibrino Yes Wound Bed Granulation Amount: None Present (0%) Exposed Structure Necrotic Amount: Large (67-100%) Fascia Exposed: No Fat Layer (Subcutaneous Tissue) Exposed: Yes Tendon Exposed: No Muscle Exposed: No Joint Exposed: No Bone Exposed: No Treatment Notes Wound #1 (Lower Leg) Wound Laterality: Left, Medial Cleanser Peri-Wound Care Topical Primary Dressing Rodney IdolRKER, Rodney R. ([id number]) Secondary Dressing Secured With Compression Wrap Compression Stockings Add-Ons Electronic Signature(s) Signed: 12/30/2020 3:01:59 PM By: Hansel FeinsteinBishop, Joy Entered By: Hansel FeinsteinBishop, Joy on 12/30/2020 13:53:58 Gatz, Dola ArgyleJAMES R. ([id number]) -------------------------------------------------------------------------------- Vitals  Details Patient Name: Rodney Oneal, PRETLOW. Date of Service: 12/30/2020 11:00 AM Medical Record Number: [id number] Patient Account Number: [id number] Date of Birth/Sex: 03-02-1956 (65 y.o. M) Treating RN: Hansel Feinstein Primary Care Deslyn Cavenaugh: Lenon Oms Other Clinician: Referring Myelle Poteat: Lenon Oms Treating Allianna Beaubien/Extender:  Tilda Franco in Treatment: 8 Vital Signs Time Taken: 11:00 Temperature (F): 98.3 Height (in): 71 Pulse (bpm): 74 Weight (lbs): 311 Respiratory Rate (breaths/min): 18 Body Mass Index (BMI): 43.4 Blood Pressure (mmHg): 126/75 Reference Range: 80 - 120 mg / dl Electronic Signature(s) Signed: 12/30/2020 3:01:59 PM By: Hansel Feinstein Entered ByHansel Feinstein on 12/30/2020 11:01:41

## 2020-12-30 NOTE — Progress Notes (Signed)
[name], [name] ([id number]) Visit Report for 12/30/2020 Fall Risk Assessment Details Patient Name: Rodney Oneal, Rodney Oneal. Date of Service: 12/30/2020 11:00 AM Medical Record Number: [id number] Patient Account Number: [id number] Date of Birth/Sex: 03/06/1956 (65 y.o. M) Treating RN: Hansel Feinstein Primary Care Star Cheese: Lenon Oms Other Clinician: Referring Pippa Hanif: Lenon Oms Treating Elen Acero/Extender: Tilda Franco in Treatment: 8 Fall Risk Assessment Items Have you had 2 or more falls in the last 12 monthso 0 Yes Have you had any fall that resulted in injury in the last 12 monthso 0 No FALLS RISK SCREEN History of falling - immediate or within 3 months 0 No Secondary diagnosis (Do you have 2 or more medical diagnoseso) 0 No Ambulatory aid None/bed rest/wheelchair/nurse 0 Yes Crutches/cane/walker 0 No Furniture 0 No Intravenous therapy Access/Saline/Heparin Lock 0 No Gait/Transferring Normal/ bed rest/ wheelchair 0 Yes Weak (short steps with or without shuffle, stooped but able to lift head while walking, may 0 No seek support from furniture) Impaired (short steps with shuffle, may have difficulty arising from chair, head down, impaired 0 No balance) Mental Status Oriented to own ability 0 Yes Notes stated his sugar dropped with recent insulin change Electronic Signature(s) Signed: 12/30/2020 3:01:59 PM By: Hansel Feinstein Entered ByHansel Feinstein on 12/30/2020 11:07:33

## 2021-01-05 ENCOUNTER — Ambulatory Visit: Payer: Medicare Other

## 2021-01-13 ENCOUNTER — Ambulatory Visit: Payer: Medicare Other | Admitting: Internal Medicine

## 2022-05-17 ENCOUNTER — Ambulatory Visit
Admission: EM | Admit: 2022-05-17 | Discharge: 2022-05-17 | Disposition: A | Discharge status: Home / Self Care | Payer: Medicare Other | Attending: Nurse Practitioner | Admitting: Nurse Practitioner

## 2022-05-17 DIAGNOSIS — J101 Influenza due to other identified influenza virus with other respiratory manifestations: Secondary | ICD-10-CM | POA: Insufficient documentation

## 2022-05-17 DIAGNOSIS — Z20822 Contact with and (suspected) exposure to covid-19: Secondary | ICD-10-CM | POA: Diagnosis present

## 2022-05-17 DIAGNOSIS — U071 COVID-19: Secondary | ICD-10-CM | POA: Diagnosis present

## 2022-05-17 LAB — RESP PANEL BY RT-PCR (FLU A&B, COVID) ARPGX2
Influenza A by PCR: POSITIVE — AB
Influenza B by PCR: NEGATIVE
SARS Coronavirus 2 by RT PCR: NEGATIVE

## 2022-05-17 MED ORDER — OSELTAMIVIR PHOSPHATE 75 MG PO CAPS: 75.0000 mg | ORAL_CAPSULE | Freq: Two times a day (BID) | ORAL | 0 refills | Status: DC

## 2022-05-17 MED ORDER — MOLNUPIRAVIR 200 MG PO CAPS: 4.0000 | ORAL_CAPSULE | Freq: Two times a day (BID) | ORAL | 0 refills | Status: AC

## 2022-05-17 NOTE — ED Provider Notes (Signed)
[facility]    CSN: [id number] Arrival date & time: 05/17/22  1603      History   Chief Complaint Chief Complaint  Patient presents with   Headache    HPI Rodney Oneal is a 66 y.o. male.   Subjective:   Rodney Oneal is a 66 y.o. male who presents for evaluation of URI.  Symptoms include headache, left ear pain, productive cough, postnasal drainage, congestion and mild wheezing.  Onset of symptoms was 2 days ago, unchanged since that time.  Denies any fevers, chills, body aches, runny nose, nausea, vomiting, diarrhea, sore throat or shortness of breath.  He is drinking plenty of fluids. He has tried Flonase for his symptoms with good relief in symptoms. Notably, his wife is sick with similar symptoms and has tested positive today for both flu A and COVID.  The following portions of the patient's history were reviewed and updated as appropriate: allergies, current medications, past family history, past medical history, past social history, past surgical history, and problem list.          Past Medical History:  Diagnosis Date   A-fib (HCC)    Arthritis    BPH (benign prostatic hyperplasia)    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    Depression    Diabetes mellitus without complication (HCC)    ED (erectile dysfunction)    GERD (gastroesophageal reflux disease)    History of kidney stones    Hyperlipidemia    Hypertension    Obesity    Sleep apnea    Stroke (HCC)     There are no problems to display for this patient.   Past Surgical History:  Procedure Laterality Date   COLONOSCOPY WITH PROPOFOL     COLONOSCOPY WITH PROPOFOL N/A 02/28/2019   Procedure: COLONOSCOPY WITH PROPOFOL;  Surgeon: Christena Deem, MD;  Location[facility] ENDOSCOPY;  Service: Endoscopy;  Laterality: N/A;   kidney stones removed     left rotator cuff repair     right shoulder surgery         Home Medications    Prior to Admission medications    Medication Sig Start Date End Date Taking? Authorizing Provider  Albuterol Sulfate 108 (90 Base) MCG/ACT AEPB Inhale into the lungs.   Yes [provider]  ascorbic acid (VITAMIN C) 500 MG tablet Take by mouth daily.   Yes [provider]  atorvastatin (LIPITOR) 40 MG tablet Take 40 mg by mouth daily.   Yes [provider]  carvedilol (COREG) 6.25 MG tablet Take 6.25 mg by mouth 2 (two) times daily with a meal.   Yes [provider]  diltiazem (CARDIZEM CD) 300 MG 24 hr capsule Take 300 mg by mouth daily.   Yes [provider]  FLUoxetine (PROZAC) 40 MG capsule Take 40 mg by mouth daily.   Yes [provider]  fluticasone (FLONASE) 50 MCG/ACT nasal spray Place into both nostrils daily.   Yes [provider]  gemfibrozil (LOPID) 600 MG tablet Take 600 mg by mouth 2 (two) times daily before a meal.   Yes [provider]  insulin detemir (LEVEMIR) 100 unit/ml SOLN Inject into the skin daily.   Yes [provider]  lisinopril-hydrochlorothiazide (ZESTORETIC) 20-25 MG tablet Take 1 tablet by mouth daily.   Yes [provider]  metFORMIN (GLUCOPHAGE) 1000 MG tablet Take 1,000 mg by mouth 2 (two) times daily with a meal.   Yes [provider]  molnupiravir EUA (LAGEVRIO) 200 MG CAPS capsule Take 4 capsules (800 mg total) by mouth 2 (two) times daily for 5 days. 05/17/22 05/22/22 Yes Lurline IdolMurrill, Donell Sliwinski, FNP  oseltamivir (TAMIFLU) 75 MG capsule Take 1 capsule (75 mg total) by mouth every 12 (twelve) hours. 05/17/22  Yes Lurline IdolMurrill, Abriel Hattery, FNP  pioglitazone (ACTOS) 30 MG tablet Take 30 mg by mouth daily.   Yes [provider]  tamsulosin (FLOMAX) 0.4 MG CAPS capsule Take 0.4 mg by mouth.   Yes [provider]  warfarin (COUMADIN) 3 MG tablet Take 3 mg by mouth daily.   Yes [provider]  zinc gluconate 50 MG tablet Take 50 mg by mouth daily.   Yes [provider]     Family History History reviewed. No pertinent family history.  Social History Social History   Tobacco Use   Smoking status: Former    Types: Cigarettes    Quit date: 02/27/2013    Years since quitting: 9.2   Smokeless tobacco: Never  Vaping Use   Vaping Use: Never used  Substance Use Topics   Alcohol use: Not Currently   Drug use: Not Currently     Allergies   Sulfa antibiotics   Review of Systems Review of Systems  Constitutional:  Negative for chills, fatigue and fever.  HENT:  Positive for congestion, ear pain and postnasal drip. Negative for sore throat.   Respiratory:  Positive for cough.   Gastrointestinal:  Negative for diarrhea, nausea and vomiting.  Musculoskeletal:  Negative for myalgias.  Neurological:  Positive for headaches.     Physical Exam Triage Vital Signs ED Triage Vitals  Enc Vitals Group     BP 05/17/22 1806 (!) 143/80     Pulse Rate 05/17/22 1806 71     Resp 05/17/22 1806 18     Temp 05/17/22 1806 98.6 F (37 C)     Temp Source 05/17/22 1806 Oral     SpO2 05/17/22 1806 92 %     Weight 05/17/22 1804 300 lb (136.1 kg)     Height 05/17/22 1804 5\' 11"  (1.803 m)     Head Circumference --      Peak Flow --      Pain Score 05/17/22 1804 0     Pain Loc --      Pain Edu? --      Excl. in GC? --    No data found.  Updated Vital Signs BP (!) 143/80 (BP Location: Left Arm)   Pulse 71   Temp 98.6 F (37 C) (Oral)   Resp 18   Ht 5\' 11"  (1.803 m)   Wt 300 lb (136.1 kg)   SpO2 92%   BMI 41.84 kg/m   Visual Acuity Right Eye Distance:   Left Eye Distance:   Bilateral Distance:    Right Eye Near:   Left Eye Near:    Bilateral Near:     Physical Exam Constitutional:      General: He is not in acute distress.    Appearance: He is well-developed. He is obese. He is not ill-appearing, toxic-appearing or diaphoretic.  HENT:     Head: Normocephalic.     Right Ear: Tympanic membrane, ear canal and external ear normal.     Left  Ear: Tympanic membrane, ear canal and external ear normal.     Nose: Nose normal.     Mouth/Throat:     Mouth: Mucous membranes are moist.  Eyes:     Conjunctiva/sclera: Conjunctivae  normal.  Cardiovascular:     Rate and Rhythm: Normal rate and regular rhythm.  Pulmonary:     Effort: Pulmonary effort is normal.     Breath sounds: Normal breath sounds.  Musculoskeletal:        General: Normal range of motion.     Cervical back: Normal range of motion and neck supple.  Lymphadenopathy:     Cervical: No cervical adenopathy.  Skin:    General: Skin is warm and dry.  Neurological:     General: No focal deficit present.     Mental Status: He is alert and oriented to person, place, and time.      UC Treatments / Results  Labs (all labs ordered are listed, but only abnormal results are displayed) Labs Reviewed  RESP PANEL BY RT-PCR (FLU A&B, COVID) ARPGX2 - Abnormal; Notable for the following components:      Result Value   Influenza A by PCR POSITIVE (*)    All other components within normal limits    EKG   Radiology No results found.  Procedures Procedures (including critical care time)  Medications Ordered in UC Medications - No data to display  Initial Impression / Assessment and Plan / UC Course  I have reviewed the triage vital signs and the nursing notes.  Pertinent labs & imaging results that were available during my care of the patient were reviewed by me and considered in my medical decision making (see chart for details).    66 yo male hypertension, atrial fibrillation, hyperlipidemia, obesity, sleep apnea and diabetes that presents with a two-day history of headache, left ear pain, productive cough, postnasal drainage, congestion and mild wheezing.  Patient is afebrile and nontoxic.  Physical exam as above.  Patient's wife is sick with similar symptoms and tested positive today for both influenza A and COVID.  Patient is only positive for influenza A today in  the clinic. Molnupiravir will be prescribed even though he is negative in the clinic due to his close exposure to COVID and personal co-morbidities that increases his risk for progression to severe illness. Discussed the importance of avoiding unnecessary antibiotic therapy. Suggested symptomatic OTC remedies. Follow up as needed.  Today's evaluation has revealed no signs of a dangerous process. Discussed diagnosis with patient and/or guardian. Patient and/or guardian aware of their diagnosis, possible red flag symptoms to watch out for and need for close follow up. Patient and/or guardian understands verbal and written discharge instructions. Patient and/or guardian comfortable with plan and disposition.  Patient and/or guardian has a clear mental status at this time, good insight into illness (after discussion and teaching) and has clear judgment to make decisions regarding their care  Documentation was completed with the aid of voice recognition software. Transcription may contain typographical errors. Final Clinical Impressions(s) / UC Diagnoses   Final diagnoses:  Influenza A  Exposure to confirmed case of COVID-19  COVID-19 determined by clinical diagnostic criteria     Discharge Instructions      You have the flu. Influenza (flu) is a viral infection that mainly affects the respiratory tract. This includes the lungs, nose, and throat. The flu spreads easily from person to person. Antibiotic medicines are not prescribed for viral infections.This is because antibiotics are designed to kill bacteria. They do not kill viruses. Symptoms of the flu usually begin suddenly and can last 4-14 days.    Your COVID test was negative but I am going to treat you as if you have COVID  due to your close exposure to the virus and high risk for COVID-related complications. Take all medications as prescribed. Drink plenty of fluids and get lots of rest.    The CDC recommends that you isolate at home for 5  days from when your symptoms started.  You may discontinue isolation after those 5 days but should continue to wear a mask for an additional 5 days to prevent spreading the virus to others.  Go to the ED immediately if your symptoms get worse or you have any other concerns.     ED Prescriptions     Medication Sig Dispense Auth. Provider   molnupiravir EUA (LAGEVRIO) 200 MG CAPS capsule Take 4 capsules (800 mg total) by mouth 2 (two) times daily for 5 days. 40 capsule [name], FNP   oseltamivir (TAMIFLU) 75 MG capsule Take 1 capsule (75 mg total) by mouth every 12 (twelve) hours. 10 capsule [name], FNP      PDMP not reviewed this encounter.   [name], [place] 05/17/22 1901

## 2022-05-17 NOTE — ED Triage Notes (Signed)
Pt c/o headache, earache, cough x3days  Pt went christmas caroling on Sunday and believes he was exposed to bronchitis.

## 2022-05-17 NOTE — Discharge Instructions (Signed)
You have the flu. Influenza (flu) is a viral infection that mainly affects the respiratory tract. This includes the lungs, nose, and throat. The flu spreads easily from person to person. Antibiotic medicines are not prescribed for viral infections.This is because antibiotics are designed to kill bacteria. They do not kill viruses. Symptoms of the flu usually begin suddenly and can last 4-14 days.    Your COVID test was negative but I am going to treat you as if you have COVID due to your close exposure to the virus and high risk for COVID-related complications. Take all medications as prescribed. Drink plenty of fluids and get lots of rest.    The CDC recommends that you isolate at home for 5 days from when your symptoms started.  You may discontinue isolation after those 5 days but should continue to wear a mask for an additional 5 days to prevent spreading the virus to others.  Go to the ED immediately if your symptoms get worse or you have any other concerns.

## 2022-12-08 ENCOUNTER — Encounter: Payer: Self-pay | Admitting: *Deleted

## 2022-12-08 ENCOUNTER — Other Ambulatory Visit: Payer: Self-pay | Admitting: *Deleted

## 2022-12-08 DIAGNOSIS — Z122 Encounter for screening for malignant neoplasm of respiratory organs: Secondary | ICD-10-CM

## 2022-12-08 DIAGNOSIS — Z87891 Personal history of nicotine dependence: Secondary | ICD-10-CM

## 2023-01-12 ENCOUNTER — Ambulatory Visit: Payer: Medicare Other

## 2023-01-12 ENCOUNTER — Ambulatory Visit (INDEPENDENT_AMBULATORY_CARE_PROVIDER_SITE_OTHER): Payer: Medicare Other | Admitting: Primary Care

## 2023-01-12 DIAGNOSIS — Z87891 Personal history of nicotine dependence: Secondary | ICD-10-CM

## 2023-01-12 NOTE — Progress Notes (Signed)
Unable to reach patient via phone or leave message. Lung cancer screening program coordinator made aware

## 2023-02-06 ENCOUNTER — Ambulatory Visit (INDEPENDENT_AMBULATORY_CARE_PROVIDER_SITE_OTHER): Payer: Medicare Other | Admitting: Nurse Practitioner

## 2023-02-06 DIAGNOSIS — Z87891 Personal history of nicotine dependence: Secondary | ICD-10-CM | POA: Diagnosis not present

## 2023-02-06 NOTE — Patient Instructions (Signed)

## 2023-02-06 NOTE — Progress Notes (Signed)
Virtual Visit via Telephone Note  I connected with [name] on 02/06/23 at  2:30 PM EDT by telephone and verified that I am speaking with the correct person using two identifiers.  Location: remote Patient: Rodney Oneal Provider: Posey Boyer  Shared Decision Making Visit Lung Cancer Screening Program [phone])   Eligibility: Age 67 y.o. Pack Years Smoking History Calculation 39 (# packs/per year x # years smoked) Recent History of coughing up blood  no Unexplained weight loss? no ( >Than 15 pounds within the last 6 months ) Prior History Lung / other cancer no (Diagnosis within the last 5 years already requiring surveillance chest CT Scans). Smoking Status Former Smoker Former Smokers: Years since quit: 9 years  Quit Date: 2015  Visit Components: Discussion included one or more decision making aids. yes Discussion included risk/benefits of screening. yes Discussion included potential follow up diagnostic testing for abnormal scans. yes Discussion included meaning and risk of over diagnosis. yes Discussion included meaning and risk of False Positives. yes Discussion included meaning of total radiation exposure. yes  Counseling Included: Importance of adherence to annual lung cancer LDCT screening. yes Impact of comorbidities on ability to participate in the program. yes Ability and willingness to under diagnostic treatment. yes  Smoking Cessation Counseling:  Former Smokers:  Discussed the importance of maintaining cigarette abstinence. yes Diagnosis Code: Personal History of Nicotine Dependence. U04.540 Information about tobacco cessation classes and interventions provided to patient. Yes Patient provided with "ticket" for LDCT Scan. N/a Written Order for Lung Cancer Screening with LDCT placed in Epic.  (CT Chest Lung Cancer Screening Low Dose W/O CM) [id number] Z12.2-Screening of respiratory organs Z87.891-Personal history of nicotine dependence   Posey Boyer, NP

## 2023-02-07 ENCOUNTER — Ambulatory Visit
Admission: RE | Admit: 2023-02-07 | Discharge: 2023-02-07 | Disposition: A | Discharge status: Home / Self Care | Payer: Medicare Other | Source: Ambulatory Visit | Attending: Acute Care | Admitting: Acute Care

## 2023-02-07 DIAGNOSIS — Z87891 Personal history of nicotine dependence: Secondary | ICD-10-CM | POA: Insufficient documentation

## 2023-02-07 DIAGNOSIS — Z122 Encounter for screening for malignant neoplasm of respiratory organs: Secondary | ICD-10-CM | POA: Diagnosis present

## 2023-02-13 ENCOUNTER — Other Ambulatory Visit: Payer: Self-pay | Admitting: Internal Medicine

## 2023-02-13 DIAGNOSIS — R0602 Shortness of breath: Secondary | ICD-10-CM

## 2023-02-13 DIAGNOSIS — I2089 Other forms of angina pectoris: Secondary | ICD-10-CM

## 2023-02-14 ENCOUNTER — Ambulatory Visit
Admission: RE | Admit: 2023-02-14 | Discharge: 2023-02-14 | Disposition: A | Discharge status: Home / Self Care | Payer: Medicare Other | Source: Ambulatory Visit | Attending: Internal Medicine | Admitting: Internal Medicine

## 2023-02-14 DIAGNOSIS — I2089 Other forms of angina pectoris: Secondary | ICD-10-CM | POA: Insufficient documentation

## 2023-02-14 DIAGNOSIS — R0602 Shortness of breath: Secondary | ICD-10-CM | POA: Insufficient documentation

## 2023-02-24 ENCOUNTER — Telehealth: Payer: Self-pay | Admitting: Acute Care

## 2023-02-24 DIAGNOSIS — R911 Solitary pulmonary nodule: Secondary | ICD-10-CM

## 2023-02-24 DIAGNOSIS — Z87891 Personal history of nicotine dependence: Secondary | ICD-10-CM

## 2023-02-24 NOTE — Telephone Encounter (Signed)
I have called the patient with the results of his low-dose screening CT.  The scan was read as a lung RADS 4A, suspicious.  There is an 8.5 mm nodular opacity in the right upper lobe that may be scarring related.  As this is the patient's first lung cancer screening scan and we do not have another scan to compare plan will be for 52-month follow-up which will be due after May 09, 2023. Sherre Lain, and Bendena please fax results to PCP and let them know plan for follow-up. Please order 78-month follow-up low-dose screening CT. Thank you so much

## 2023-02-27 NOTE — Telephone Encounter (Signed)
CT results/ plans faxed to PCP. Order placed for 3 month nodule f/u low dose CT.

## 2023-03-27 ENCOUNTER — Ambulatory Visit
Admission: RE | Admit: 2023-03-27 | Discharge: 2023-03-27 | Disposition: A | Discharge status: Home / Self Care | Payer: Medicare Other | Attending: Internal Medicine | Admitting: Internal Medicine

## 2023-03-27 ENCOUNTER — Ambulatory Visit: Payer: Medicare Other | Admitting: Anesthesiology

## 2023-03-27 ENCOUNTER — Encounter
Admission: RE | Disposition: A | Discharge status: Home / Self Care | Payer: Self-pay | Source: Home / Self Care | Attending: Internal Medicine

## 2023-03-27 ENCOUNTER — Encounter: Payer: Self-pay | Admitting: Internal Medicine

## 2023-03-27 DIAGNOSIS — N400 Enlarged prostate without lower urinary tract symptoms: Secondary | ICD-10-CM | POA: Diagnosis not present

## 2023-03-27 DIAGNOSIS — I4891 Unspecified atrial fibrillation: Secondary | ICD-10-CM | POA: Diagnosis present

## 2023-03-27 DIAGNOSIS — Z794 Long term (current) use of insulin: Secondary | ICD-10-CM | POA: Diagnosis not present

## 2023-03-27 DIAGNOSIS — E119 Type 2 diabetes mellitus without complications: Secondary | ICD-10-CM | POA: Insufficient documentation

## 2023-03-27 DIAGNOSIS — Z8673 Personal history of transient ischemic attack (TIA), and cerebral infarction without residual deficits: Secondary | ICD-10-CM | POA: Diagnosis not present

## 2023-03-27 DIAGNOSIS — K219 Gastro-esophageal reflux disease without esophagitis: Secondary | ICD-10-CM | POA: Diagnosis not present

## 2023-03-27 DIAGNOSIS — I10 Essential (primary) hypertension: Secondary | ICD-10-CM | POA: Insufficient documentation

## 2023-03-27 DIAGNOSIS — I4819 Other persistent atrial fibrillation: Secondary | ICD-10-CM

## 2023-03-27 DIAGNOSIS — Z87891 Personal history of nicotine dependence: Secondary | ICD-10-CM | POA: Diagnosis not present

## 2023-03-27 DIAGNOSIS — F32A Depression, unspecified: Secondary | ICD-10-CM | POA: Insufficient documentation

## 2023-03-27 DIAGNOSIS — Z6841 Body Mass Index (BMI) 40.0 and over, adult: Secondary | ICD-10-CM | POA: Insufficient documentation

## 2023-03-27 DIAGNOSIS — G4733 Obstructive sleep apnea (adult) (pediatric): Secondary | ICD-10-CM | POA: Insufficient documentation

## 2023-03-27 DIAGNOSIS — E785 Hyperlipidemia, unspecified: Secondary | ICD-10-CM | POA: Insufficient documentation

## 2023-03-27 HISTORY — PX: CARDIOVERSION: SHX1299

## 2023-03-27 LAB — GLUCOSE, CAPILLARY: Glucose-Capillary: 155 mg/dL — ABNORMAL HIGH (ref 70–99)

## 2023-03-27 LAB — PROTIME-INR
INR: 2 — ABNORMAL HIGH (ref 0.8–1.2)
Prothrombin Time: 22.7 s — ABNORMAL HIGH (ref 11.4–15.2)

## 2023-03-27 SURGERY — CARDIOVERSION
Anesthesia: General

## 2023-03-27 MED ORDER — PROPOFOL 10 MG/ML IV BOLUS
INTRAVENOUS | Status: DC | PRN
  Administered 2023-03-27: 40 mg via INTRAVENOUS
  Administered 2023-03-27: 20 mg via INTRAVENOUS
  Administered 2023-03-27: 30 mg via INTRAVENOUS
  Administered 2023-03-27: 50 mg via INTRAVENOUS
  Administered 2023-03-27: 40 mg via INTRAVENOUS

## 2023-03-27 MED ORDER — PROPOFOL 10 MG/ML IV BOLUS
INTRAVENOUS | Status: AC
Start: 2023-03-27 — End: ?
  Filled 2023-03-27: qty 40

## 2023-03-27 NOTE — CV Procedure (Signed)
Electrical Cardioversion Procedure Note   Procedure: Electrical Cardioversion Indications:  Atrial Fibrillation  Procedure Details Consent: Risks of procedure as well as the alternatives and risks of each were explained to the (patient/caregiver).  Consent for procedure obtained. Time Out: Verified patient identification, verified procedure, site/side was marked, verified correct patient position, special equipment/implants available, medications/allergies/relevent history reviewed, required imaging and test results available.  Performed  Patient placed on cardiac monitor, pulse oximetry, supplemental oxygen as necessary.  Sedation given:  Propofol as per anesthesia Pacer pads placed anterior and posterior chest.  Cardioverted 3 time(s).  Cardioverted at 200J.  Evaluation Findings: Post procedure EKG shows: NSR Complications: None Patient did tolerate procedure well.   Dorothyann Peng MD Cardiology 03/27/2023 08:00 am

## 2023-03-27 NOTE — Anesthesia Postprocedure Evaluation (Signed)
Anesthesia Post Note  Patient: MAEL MILCH  Procedure(s) Performed: CARDIOVERSION  Patient location during evaluation: Specials Recovery Anesthesia Type: General Level of consciousness: awake and alert Pain management: pain level controlled Vital Signs Assessment: post-procedure vital signs reviewed and stable Respiratory status: spontaneous breathing, nonlabored ventilation, respiratory function stable and patient connected to nasal cannula oxygen Cardiovascular status: blood pressure returned to baseline and stable Postop Assessment: no apparent nausea or vomiting Anesthetic complications: no   No notable events documented.   Last Vitals:  Vitals:   03/27/23 0800 03/27/23 0815  BP: (!) 105/59 117/75  Pulse: (!) 56 (!) 59  Resp: 19 17  Temp:    SpO2: 93% 94%    Last Pain:  Vitals:   03/27/23 0815  TempSrc:   PainSc: 0-No pain                 Corinda Gubler

## 2023-03-27 NOTE — Anesthesia Preprocedure Evaluation (Signed)
Anesthesia Evaluation  Patient identified by MRN, date of birth, ID band Patient awake    Reviewed: Allergy & Precautions, NPO status , Patient's Chart, lab work & pertinent test results  History of Anesthesia Complications Negative for: history of anesthetic complications  Airway Mallampati: III  TM Distance: >3 FB Neck ROM: Full    Dental no notable dental hx. (+) Teeth Intact   Pulmonary sleep apnea and Continuous Positive Airway Pressure Ventilation , neg COPD, Patient abstained from smoking.Not current smoker, former smoker    + decreased breath sounds      Cardiovascular Exercise Tolerance: Poor METShypertension, Pt. on medications (-) CAD and (-) Past MI + dysrhythmias Atrial Fibrillation  Rhythm:Irregular Rate:Normal - Systolic murmurs    Neuro/Psych  PSYCHIATRIC DISORDERS  Depression    Stroke 20 years ago. Some word finding difficulties remain, but no motor or sensory deficits CVA, No Residual Symptoms    GI/Hepatic ,GERD  Medicated and Controlled,,(+)     (-) substance abuse    Endo/Other  diabetes, Type 2, Insulin Dependent  Morbid obesity  Renal/GU negative Renal ROS     Musculoskeletal   Abdominal  (+) + obese  Peds  Hematology   Anesthesia Other Findings Past Medical History: No date: A-fib (HCC) No date: Arthritis No date: BPH (benign prostatic hyperplasia) No date: 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 No date: Depression No date: Diabetes mellitus without complication (HCC) No date: ED (erectile dysfunction) No date: GERD (gastroesophageal reflux disease) No date: History of kidney stones No date: Hyperlipidemia No date: Hypertension No date: Obesity No date: Sleep apnea No date: Stroke [facility])  Reproductive/Obstetrics                             Anesthesia Physical Anesthesia Plan  ASA: 3  Anesthesia Plan: General   Post-op Pain Management:  Minimal or no pain anticipated   Induction: Intravenous  PONV Risk Score and Plan: 2 and Propofol infusion, TIVA and Ondansetron  Airway Management Planned: Nasal Cannula  Additional Equipment: None  Intra-op Plan:   Post-operative Plan:   Informed Consent: I have reviewed the patients History and Physical, chart, labs and discussed the procedure including the risks, benefits and alternatives for the proposed anesthesia with the patient or authorized representative who has indicated his/her understanding and acceptance.     Dental advisory given  Plan Discussed with: CRNA and Surgeon  Anesthesia Plan Comments: (Discussed risks of anesthesia with patient, including possibility of difficulty with spontaneous ventilation under anesthesia necessitating airway intervention, PONV, and rare risks such as cardiac or respiratory or neurological events, and allergic reactions. Discussed the role of CRNA in patient's perioperative care. Patient understands. Patient informed about increased incidence of above perioperative risk due to high BMI. Patient understands.  )       Anesthesia Quick Evaluation

## 2023-03-27 NOTE — Transfer of Care (Signed)
Immediate Anesthesia Transfer of Care Note  Patient: Rodney Oneal  Procedure(s) Performed: CARDIOVERSION  Patient Location: cardiac specials  Anesthesia Type:General  Level of Consciousness: sedated and drowsy  Airway & Oxygen Therapy: Patient Spontanous Breathing and Patient connected to face mask oxygen  Post-op Assessment: Report given to RN and Post -op Vital signs reviewed and stable  Post vital signs: Reviewed and stable  Last Vitals:  Vitals Value Taken Time  BP 103/62 03/27/23 0750  Temp    Pulse 55 03/27/23 0750  Resp 20 03/27/23 0750  SpO2 98 % 03/27/23 0750    Last Pain:  Vitals:   03/27/23 0700  TempSrc: Oral  PainSc: 0-No pain         Complications: No notable events documented.

## 2023-03-27 NOTE — Anesthesia Procedure Notes (Signed)
Date/Time: 03/27/2023 7:31 AM  Performed by: Ginger Carne, CRNAPre-anesthesia Checklist: Patient identified, Emergency Drugs available, Suction available, Patient being monitored and Timeout performed Patient Re-evaluated:Patient Re-evaluated prior to induction Oxygen Delivery Method: Simple face mask Preoxygenation: Pre-oxygenation with 100% oxygen Induction Type: IV induction

## 2023-04-18 MED ORDER — SODIUM CHLORIDE 0.9 % IV SOLN
INTRAVENOUS | Status: DC
Start: 2023-04-18 — End: 2023-04-19

## 2023-04-19 ENCOUNTER — Encounter: Payer: Self-pay | Admitting: Anesthesiology

## 2023-04-19 ENCOUNTER — Ambulatory Visit
Admission: RE | Admit: 2023-04-19 | Discharge: 2023-04-19 | Disposition: A | Discharge status: Home / Self Care | Payer: Medicare Other | Attending: Internal Medicine | Admitting: Internal Medicine

## 2023-04-19 ENCOUNTER — Encounter
Admission: RE | Disposition: A | Discharge status: Home / Self Care | Payer: Self-pay | Source: Home / Self Care | Attending: Internal Medicine

## 2023-04-19 DIAGNOSIS — I4819 Other persistent atrial fibrillation: Secondary | ICD-10-CM

## 2023-04-19 HISTORY — PX: CARDIOVERSION: SHX1299

## 2023-04-19 SURGERY — CARDIOVERSION
Anesthesia: General

## 2023-04-20 ENCOUNTER — Encounter: Payer: Self-pay | Admitting: Internal Medicine

## 2023-05-09 ENCOUNTER — Ambulatory Visit
Admission: RE | Admit: 2023-05-09 | Discharge: 2023-05-09 | Disposition: A | Discharge status: Home / Self Care | Payer: Medicare Other | Source: Ambulatory Visit | Attending: Acute Care | Admitting: Acute Care

## 2023-05-09 DIAGNOSIS — R911 Solitary pulmonary nodule: Secondary | ICD-10-CM

## 2023-05-09 DIAGNOSIS — Z87891 Personal history of nicotine dependence: Secondary | ICD-10-CM

## 2023-05-10 ENCOUNTER — Ambulatory Visit
Admission: RE | Admit: 2023-05-10 | Discharge: 2023-05-10 | Disposition: A | Discharge status: Home / Self Care | Payer: Medicare Other | Attending: Internal Medicine | Admitting: Internal Medicine

## 2023-05-10 ENCOUNTER — Ambulatory Visit: Payer: Medicare Other | Admitting: Certified Registered Nurse Anesthetist

## 2023-05-10 ENCOUNTER — Encounter
Admission: RE | Disposition: A | Discharge status: Home / Self Care | Payer: Self-pay | Source: Home / Self Care | Attending: Internal Medicine

## 2023-05-10 ENCOUNTER — Encounter: Payer: Self-pay | Admitting: Internal Medicine

## 2023-05-10 DIAGNOSIS — Z87891 Personal history of nicotine dependence: Secondary | ICD-10-CM | POA: Insufficient documentation

## 2023-05-10 DIAGNOSIS — Z8673 Personal history of transient ischemic attack (TIA), and cerebral infarction without residual deficits: Secondary | ICD-10-CM | POA: Diagnosis not present

## 2023-05-10 DIAGNOSIS — Z7984 Long term (current) use of oral hypoglycemic drugs: Secondary | ICD-10-CM | POA: Insufficient documentation

## 2023-05-10 DIAGNOSIS — I4819 Other persistent atrial fibrillation: Secondary | ICD-10-CM | POA: Diagnosis present

## 2023-05-10 DIAGNOSIS — F32A Depression, unspecified: Secondary | ICD-10-CM | POA: Diagnosis not present

## 2023-05-10 DIAGNOSIS — Z794 Long term (current) use of insulin: Secondary | ICD-10-CM | POA: Insufficient documentation

## 2023-05-10 DIAGNOSIS — E119 Type 2 diabetes mellitus without complications: Secondary | ICD-10-CM | POA: Diagnosis not present

## 2023-05-10 DIAGNOSIS — I4891 Unspecified atrial fibrillation: Secondary | ICD-10-CM | POA: Diagnosis not present

## 2023-05-10 DIAGNOSIS — G4730 Sleep apnea, unspecified: Secondary | ICD-10-CM | POA: Diagnosis not present

## 2023-05-10 DIAGNOSIS — I10 Essential (primary) hypertension: Secondary | ICD-10-CM | POA: Insufficient documentation

## 2023-05-10 DIAGNOSIS — K219 Gastro-esophageal reflux disease without esophagitis: Secondary | ICD-10-CM | POA: Diagnosis not present

## 2023-05-10 DIAGNOSIS — Z79899 Other long term (current) drug therapy: Secondary | ICD-10-CM | POA: Diagnosis not present

## 2023-05-10 HISTORY — PX: CARDIOVERSION: SHX1299

## 2023-05-10 LAB — GLUCOSE, CAPILLARY: Glucose-Capillary: 157 mg/dL — ABNORMAL HIGH (ref 70–99)

## 2023-05-10 SURGERY — CARDIOVERSION
Anesthesia: General

## 2023-05-10 MED ORDER — PROPOFOL 10 MG/ML IV BOLUS
INTRAVENOUS | Status: AC
  Filled 2023-05-10: qty 40

## 2023-05-10 MED ORDER — PROPOFOL 10 MG/ML IV BOLUS
INTRAVENOUS | Status: DC | PRN
  Administered 2023-05-10: 60 mg via INTRAVENOUS
  Administered 2023-05-10: 20 mg via INTRAVENOUS

## 2023-05-10 NOTE — Anesthesia Preprocedure Evaluation (Signed)
Anesthesia Evaluation  Patient identified by MRN, date of birth, ID band Patient awake    Reviewed: Allergy & Precautions, H&P , NPO status , Patient's Chart, lab work & pertinent test results, reviewed documented beta blocker date and time   Airway Mallampati: II   Neck ROM: full    Dental  (+) Poor Dentition   Pulmonary sleep apnea and Continuous Positive Airway Pressure Ventilation , former smoker   Pulmonary exam normal        Cardiovascular Exercise Tolerance: Poor hypertension, On Medications negative cardio ROS Atrial Fibrillation  Rhythm:regular Rate:Normal     Neuro/Psych  PSYCHIATRIC DISORDERS  Depression    CVA    GI/Hepatic Neg liver ROS,GERD  Medicated,,  Endo/Other  negative endocrine ROSdiabetes, Well Controlled    Renal/GU negative Renal ROS  negative genitourinary   Musculoskeletal   Abdominal   Peds  Hematology negative hematology ROS (+)   Anesthesia Other Findings Past Medical History: No date: A-fib (HCC) No date: Arthritis No date: BPH (benign prostatic hyperplasia) No date: 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 No date: Depression No date: Diabetes mellitus without complication (HCC) No date: ED (erectile dysfunction) No date: GERD (gastroesophageal reflux disease) No date: History of kidney stones No date: Hyperlipidemia No date: Hypertension No date: Obesity No date: Sleep apnea No date: Stroke [facility]) Past Surgical History: 03/27/2023: CARDIOVERSION; N/A     Comment:  Procedure: CARDIOVERSION;  Surgeon: Alwyn Pea,               MD;  Location: [facility] ORS;  Service: Cardiovascular;                Laterality: N/A; 04/19/2023: CARDIOVERSION; N/A     Comment:  Procedure: CARDIOVERSION;  Surgeon: Alwyn Pea,               MD;  Location: [facility] ORS;  Service: Cardiovascular;                Laterality: N/A; No date: COLONOSCOPY WITH PROPOFOL 02/28/2019:  COLONOSCOPY WITH PROPOFOL; N/A     Comment:  Procedure: COLONOSCOPY WITH PROPOFOL;  Surgeon:               Christena Deem, MD;  Location: [facility] ENDOSCOPY;                Service: Endoscopy;  Laterality: N/A; No date: kidney stones removed No date: left rotator cuff repair No date: right shoulder surgery BMI    Body Mass Index: 43.38 kg/m     Reproductive/Obstetrics negative OB ROS                             Anesthesia Physical Anesthesia Plan  ASA: 4  Anesthesia Plan: General   Post-op Pain Management:    Induction:   PONV Risk Score and Plan:   Airway Management Planned:   Additional Equipment:   Intra-op Plan:   Post-operative Plan:   Informed Consent: I have reviewed the patients History and Physical, chart, labs and discussed the procedure including the risks, benefits and alternatives for the proposed anesthesia with the patient or authorized representative who has indicated his/her understanding and acceptance.     Dental Advisory Given  Plan Discussed with: CRNA  Anesthesia Plan Comments:        Anesthesia Quick Evaluation

## 2023-05-10 NOTE — Anesthesia Postprocedure Evaluation (Signed)
Anesthesia Post Note  Patient: MARLYN TEE  Procedure(s) Performed: CARDIOVERSION  Patient location during evaluation: PACU Anesthesia Type: General Level of consciousness: awake and alert Pain management: pain level controlled Vital Signs Assessment: post-procedure vital signs reviewed and stable Respiratory status: spontaneous breathing, nonlabored ventilation, respiratory function stable and patient connected to nasal cannula oxygen Cardiovascular status: blood pressure returned to baseline and stable Postop Assessment: no apparent nausea or vomiting Anesthetic complications: no   No notable events documented.   Last Vitals:  Vitals:   05/10/23 1226 05/10/23 1230  BP:  (!) 130/92  Pulse: 80 64  Resp: (!) 24 (!) 22  Temp:    SpO2: (!) 88% 92%    Last Pain:  Vitals:   05/10/23 1230  TempSrc:   PainSc: 0-No pain                 Yevette Edwards

## 2023-05-10 NOTE — Transfer of Care (Signed)
Immediate Anesthesia Transfer of Care Note  Patient: Rodney Oneal  Procedure(s) Performed: CARDIOVERSION  Patient Location: PACU  Anesthesia Type:General  Level of Consciousness: awake, alert , and oriented  Airway & Oxygen Therapy: Patient Spontanous Breathing  Post-op Assessment: Report given to RN and Post -op Vital signs reviewed and stable  Post vital signs: Reviewed and stable  Last Vitals:  Vitals Value Taken Time  BP 127/80 05/10/23 1227  Temp    Pulse 72 05/10/23 1227  Resp 21 05/10/23 1227  SpO2 92 % 05/10/23 1227  Vitals shown include unfiled device data.  Last Pain:  Vitals:   05/10/23 1100  TempSrc: Oral  PainSc: 0-No pain         Complications: No notable events documented.

## 2023-05-10 NOTE — CV Procedure (Signed)
Direct current cardioversion 05/10/2023 12:38 PM  Indication symptomatic A. Fibrillation.  Procedure: Using IV Propofol and  IV Lidocaine (for reducing venous pain) for achieving deep sedation, synchronized direct current cardioversion performed. Patient was delivered with 200 Joules of electricity X 3 without success to NSR. Patient tolerated the procedure well. No immediate complication noted.   Clotilde Dieter, DO 12:38 PM 05/10/23

## 2023-05-15 ENCOUNTER — Encounter: Payer: Self-pay | Admitting: Internal Medicine

## 2023-06-01 ENCOUNTER — Telehealth: Payer: Self-pay | Admitting: Acute Care

## 2023-06-01 DIAGNOSIS — R911 Solitary pulmonary nodule: Secondary | ICD-10-CM

## 2023-06-01 NOTE — Telephone Encounter (Signed)
 Patient had LDCT on 05/09/2023 that resulted as a LR3. There is a 8.73mm nodule that has grown to 9.64mm in 3 months. Sarah Groce NP has recommended a 3 month follow up scan because of growth, due 08/08/2023. There is also mention of aortic atherosclerosis, left main and 3-vessel CAD with recommendations to have an echo. Per [facility] pt had an echo in 02/2023 and this is being managed by [facility]. There is also ILD noted, patient will need a consult appt with Dr. Theophilus or Dr. Geronimo for ILD eval.    IMPRESSION: 1. Lung-RADS 3S, probably benign findings. Short-term follow-up in 6 months is recommended with repeat low-dose chest CT without contrast (please use the following order, CT CHEST LCS NODULE FOLLOW-UP W/O CM). 2. The S modifier above refers to potentially clinically significant non lung cancer related findings. Specifically, there is aortic atherosclerosis, in addition to left main and three-vessel coronary artery disease. Please note that although the presence of coronary artery calcium  documents the presence of coronary artery disease, the severity of this disease and any potential stenosis cannot be assessed on this non-gated CT examination. Assessment for potential risk factor modification, dietary therapy or pharmacologic therapy may be warranted, if clinically indicated. 3. Mild diffuse bronchial wall thickening with very mild centrilobular and paraseptal emphysema; imaging findings suggestive of underlying COPD. 4. The appearance of the lungs suggests underlying interstitial lung disease. Nonemergent outpatient referral to Pulmonology for further clinical evaluation is recommended. 5. There are calcifications of the aortic valve. Echocardiographic correlation for evaluation of potential valvular dysfunction may be warranted if clinically indicated.   Aortic Atherosclerosis (ICD10-I70.0) and Emphysema (ICD10-J43.9).     Electronically Signed   By: Toribio Aye  M.D.   On: 05/22/2023 10:31

## 2023-06-02 NOTE — Telephone Encounter (Signed)
 Called and spoke to pt and pt's wife. Discussed results of LDCT and recommendations to have a 3 month follow up. Patient is agreeable to follow up scan, ordered placed. Patient did confirm he had echo and his cardiac findings are being managed by [facility]. We discussed the findings of ILD noted on the scan. Patient states he is getting over a cold and thinks this is causing the findings and would like to wait until the March follow up scan, if still present then he would like to have a pulm referral for ILD.

## 2023-08-02 ENCOUNTER — Ambulatory Visit: Payer: Medicare Other

## 2023-08-22 ENCOUNTER — Ambulatory Visit

## 2023-09-05 ENCOUNTER — Ambulatory Visit
Admission: RE | Admit: 2023-09-05 | Discharge: 2023-09-05 | Disposition: A | Discharge status: Home / Self Care | Source: Ambulatory Visit | Attending: Acute Care | Admitting: Acute Care

## 2023-09-05 DIAGNOSIS — R911 Solitary pulmonary nodule: Secondary | ICD-10-CM | POA: Insufficient documentation

## 2023-09-11 ENCOUNTER — Emergency Department

## 2023-09-11 ENCOUNTER — Inpatient Hospital Stay
Admission: EM | Admit: 2023-09-11 | Discharge: 2023-09-14 | DRG: 683 | Disposition: A | Discharge status: Home / Self Care | Attending: Internal Medicine | Admitting: Internal Medicine

## 2023-09-11 ENCOUNTER — Other Ambulatory Visit: Payer: Self-pay

## 2023-09-11 DIAGNOSIS — E119 Type 2 diabetes mellitus without complications: Secondary | ICD-10-CM

## 2023-09-11 DIAGNOSIS — Z882 Allergy status to sulfonamides status: Secondary | ICD-10-CM

## 2023-09-11 DIAGNOSIS — E66813 Obesity, class 3: Secondary | ICD-10-CM

## 2023-09-11 DIAGNOSIS — E860 Dehydration: Secondary | ICD-10-CM | POA: Diagnosis not present

## 2023-09-11 DIAGNOSIS — E875 Hyperkalemia: Secondary | ICD-10-CM

## 2023-09-11 DIAGNOSIS — G3184 Mild cognitive impairment, so stated: Secondary | ICD-10-CM | POA: Diagnosis present

## 2023-09-11 DIAGNOSIS — E785 Hyperlipidemia, unspecified: Secondary | ICD-10-CM

## 2023-09-11 DIAGNOSIS — N179 Acute kidney failure, unspecified: Principal | ICD-10-CM | POA: Diagnosis present

## 2023-09-11 DIAGNOSIS — K219 Gastro-esophageal reflux disease without esophagitis: Secondary | ICD-10-CM | POA: Diagnosis present

## 2023-09-11 DIAGNOSIS — J449 Chronic obstructive pulmonary disease, unspecified: Secondary | ICD-10-CM | POA: Diagnosis present

## 2023-09-11 DIAGNOSIS — Z794 Long term (current) use of insulin: Secondary | ICD-10-CM

## 2023-09-11 DIAGNOSIS — E781 Pure hyperglyceridemia: Secondary | ICD-10-CM | POA: Insufficient documentation

## 2023-09-11 DIAGNOSIS — Z1152 Encounter for screening for COVID-19: Secondary | ICD-10-CM

## 2023-09-11 DIAGNOSIS — T501X5A Adverse effect of loop [high-ceiling] diuretics, initial encounter: Secondary | ICD-10-CM | POA: Diagnosis present

## 2023-09-11 DIAGNOSIS — E8881 Metabolic syndrome: Secondary | ICD-10-CM

## 2023-09-11 DIAGNOSIS — Z79899 Other long term (current) drug therapy: Secondary | ICD-10-CM

## 2023-09-11 DIAGNOSIS — Z87891 Personal history of nicotine dependence: Secondary | ICD-10-CM

## 2023-09-11 DIAGNOSIS — I480 Paroxysmal atrial fibrillation: Secondary | ICD-10-CM | POA: Diagnosis present

## 2023-09-11 DIAGNOSIS — R531 Weakness: Principal | ICD-10-CM

## 2023-09-11 DIAGNOSIS — Z8249 Family history of ischemic heart disease and other diseases of the circulatory system: Secondary | ICD-10-CM

## 2023-09-11 DIAGNOSIS — I10 Essential (primary) hypertension: Secondary | ICD-10-CM

## 2023-09-11 DIAGNOSIS — Z7901 Long term (current) use of anticoagulants: Secondary | ICD-10-CM

## 2023-09-11 DIAGNOSIS — I5032 Chronic diastolic (congestive) heart failure: Secondary | ICD-10-CM | POA: Diagnosis present

## 2023-09-11 DIAGNOSIS — I4891 Unspecified atrial fibrillation: Secondary | ICD-10-CM

## 2023-09-11 DIAGNOSIS — Z7984 Long term (current) use of oral hypoglycemic drugs: Secondary | ICD-10-CM

## 2023-09-11 DIAGNOSIS — Z833 Family history of diabetes mellitus: Secondary | ICD-10-CM

## 2023-09-11 DIAGNOSIS — Z8673 Personal history of transient ischemic attack (TIA), and cerebral infarction without residual deficits: Secondary | ICD-10-CM

## 2023-09-11 DIAGNOSIS — G4733 Obstructive sleep apnea (adult) (pediatric): Secondary | ICD-10-CM | POA: Insufficient documentation

## 2023-09-11 DIAGNOSIS — E669 Obesity, unspecified: Secondary | ICD-10-CM

## 2023-09-11 DIAGNOSIS — N400 Enlarged prostate without lower urinary tract symptoms: Secondary | ICD-10-CM | POA: Diagnosis present

## 2023-09-11 DIAGNOSIS — Z6841 Body Mass Index (BMI) 40.0 and over, adult: Secondary | ICD-10-CM

## 2023-09-11 DIAGNOSIS — I110 Hypertensive heart disease with heart failure: Secondary | ICD-10-CM | POA: Diagnosis present

## 2023-09-11 LAB — URINALYSIS, ROUTINE W REFLEX MICROSCOPIC
Bilirubin Urine: NEGATIVE
Glucose, UA: NEGATIVE mg/dL
Hgb urine dipstick: NEGATIVE
Ketones, ur: NEGATIVE mg/dL
Leukocytes,Ua: NEGATIVE
Nitrite: NEGATIVE
Protein, ur: NEGATIVE mg/dL
Specific Gravity, Urine: 1.009 (ref 1.005–1.030)
pH: 5 (ref 5.0–8.0)

## 2023-09-11 LAB — CBC
HCT: 40 % (ref 39.0–52.0)
Hemoglobin: 12 g/dL — ABNORMAL LOW (ref 13.0–17.0)
MCH: 29.9 pg (ref 26.0–34.0)
MCHC: 30 g/dL (ref 30.0–36.0)
MCV: 99.8 fL (ref 80.0–100.0)
Platelets: 187 10*3/uL (ref 150–400)
RBC: 4.01 MIL/uL — ABNORMAL LOW (ref 4.22–5.81)
RDW: 18.3 % — ABNORMAL HIGH (ref 11.5–15.5)
WBC: 8.8 10*3/uL (ref 4.0–10.5)
nRBC: 0 % (ref 0.0–0.2)

## 2023-09-11 LAB — RESP PANEL BY RT-PCR (RSV, FLU A&B, COVID)  RVPGX2
Influenza A by PCR: NEGATIVE
Influenza B by PCR: NEGATIVE
Resp Syncytial Virus by PCR: NEGATIVE
SARS Coronavirus 2 by RT PCR: NEGATIVE

## 2023-09-11 LAB — MAGNESIUM: Magnesium: 2.3 mg/dL (ref 1.7–2.4)

## 2023-09-11 LAB — BASIC METABOLIC PANEL WITH GFR
Anion gap: 15 (ref 5–15)
BUN: 114 mg/dL — ABNORMAL HIGH (ref 8–23)
CO2: 21 mmol/L — ABNORMAL LOW (ref 22–32)
Calcium: 8.9 mg/dL (ref 8.9–10.3)
Chloride: 102 mmol/L (ref 98–111)
Creatinine, Ser: 4.53 mg/dL — ABNORMAL HIGH (ref 0.61–1.24)
GFR, Estimated: 13 mL/min — ABNORMAL LOW (ref >60)
Glucose, Bld: 174 mg/dL — ABNORMAL HIGH (ref 70–99)
Potassium: 5.2 mmol/L — ABNORMAL HIGH (ref 3.5–5.1)
Sodium: 138 mmol/L (ref 135–145)

## 2023-09-11 LAB — TROPONIN I (HIGH SENSITIVITY): Troponin I (High Sensitivity): 27 ng/L — ABNORMAL HIGH (ref <18)

## 2023-09-11 LAB — GLUCOSE, CAPILLARY: Glucose-Capillary: 195 mg/dL — ABNORMAL HIGH (ref 70–99)

## 2023-09-11 LAB — PROTIME-INR
INR: 4.7 (ref 0.8–1.2)
Prothrombin Time: 44.4 s — ABNORMAL HIGH (ref 11.4–15.2)

## 2023-09-11 LAB — PHOSPHORUS: Phosphorus: 5.9 mg/dL — ABNORMAL HIGH (ref 2.5–4.6)

## 2023-09-11 MED ORDER — SODIUM CHLORIDE 0.9 % IV SOLN: INTRAVENOUS | Status: DC

## 2023-09-11 MED ORDER — FLUTICASONE PROPIONATE 50 MCG/ACT NA SUSP: 2.0000 | Freq: Every day | NASAL | Status: DC | PRN

## 2023-09-11 MED ORDER — VITAMIN D 25 MCG (1000 UNIT) PO TABS
2000.0000 [IU] | ORAL_TABLET | Freq: Every morning | ORAL | Status: DC
Start: 2023-09-12 — End: 2023-09-14
  Administered 2023-09-12 – 2023-09-14 (×3): 2000 [IU] via ORAL
  Filled 2023-09-11 (×3): qty 2

## 2023-09-11 MED ORDER — INSULIN ASPART 100 UNIT/ML IJ SOLN: 0.0000 [IU] | Freq: Every day | INTRAMUSCULAR | Status: DC

## 2023-09-11 MED ORDER — AMIODARONE HCL 200 MG PO TABS
200.0000 mg | ORAL_TABLET | Freq: Every morning | ORAL | Status: DC
  Administered 2023-09-12 – 2023-09-14 (×3): 200 mg via ORAL
  Filled 2023-09-11 (×3): qty 1

## 2023-09-11 MED ORDER — INSULIN ASPART 100 UNIT/ML IJ SOLN
0.0000 [IU] | Freq: Three times a day (TID) | INTRAMUSCULAR | Status: DC
  Administered 2023-09-12: 3 [IU] via SUBCUTANEOUS
  Administered 2023-09-12 – 2023-09-13 (×3): 2 [IU] via SUBCUTANEOUS
  Administered 2023-09-13: 1 [IU] via SUBCUTANEOUS
  Administered 2023-09-14: 3 [IU] via SUBCUTANEOUS
  Filled 2023-09-11 (×6): qty 1

## 2023-09-11 MED ORDER — ACETAMINOPHEN 650 MG RE SUPP: 650.0000 mg | Freq: Four times a day (QID) | RECTAL | Status: DC | PRN

## 2023-09-11 MED ORDER — MONTELUKAST SODIUM 10 MG PO TABS: 10.0000 mg | ORAL_TABLET | Freq: Every day | ORAL | Status: DC | PRN

## 2023-09-11 MED ORDER — HEPARIN SODIUM (PORCINE) 5000 UNIT/ML IJ SOLN: 5000.0000 [IU] | Freq: Three times a day (TID) | INTRAMUSCULAR | Status: DC

## 2023-09-11 MED ORDER — LACTATED RINGERS IV BOLUS
1000.0000 mL | Freq: Once | INTRAVENOUS | Status: AC
  Administered 2023-09-11: 1000 mL via INTRAVENOUS

## 2023-09-11 MED ORDER — ONDANSETRON HCL 4 MG PO TABS: 4.0000 mg | ORAL_TABLET | Freq: Four times a day (QID) | ORAL | Status: DC | PRN

## 2023-09-11 MED ORDER — TAMSULOSIN HCL 0.4 MG PO CAPS
0.4000 mg | ORAL_CAPSULE | Freq: Every day | ORAL | Status: DC
  Administered 2023-09-12 – 2023-09-13 (×2): 0.4 mg via ORAL
  Filled 2023-09-11 (×2): qty 1

## 2023-09-11 MED ORDER — VITAMIN B-12 1000 MCG PO TABS
1000.0000 ug | ORAL_TABLET | Freq: Every morning | ORAL | Status: DC
  Administered 2023-09-12 – 2023-09-14 (×3): 1000 ug via ORAL
  Filled 2023-09-11 (×3): qty 1

## 2023-09-11 MED ORDER — ONDANSETRON HCL 4 MG/2ML IJ SOLN: 4.0000 mg | Freq: Four times a day (QID) | INTRAMUSCULAR | Status: DC | PRN

## 2023-09-11 MED ORDER — VITAMIN C 500 MG PO TABS
500.0000 mg | ORAL_TABLET | Freq: Every morning | ORAL | Status: DC
Start: 2023-09-12 — End: 2023-09-14
  Administered 2023-09-12 – 2023-09-14 (×3): 500 mg via ORAL
  Filled 2023-09-11 (×3): qty 1

## 2023-09-11 MED ORDER — ACETAMINOPHEN 325 MG PO TABS: 650.0000 mg | ORAL_TABLET | Freq: Four times a day (QID) | ORAL | Status: DC | PRN

## 2023-09-11 MED ORDER — FLUOXETINE HCL 20 MG PO CAPS
40.0000 mg | ORAL_CAPSULE | Freq: Every day | ORAL | Status: DC
  Administered 2023-09-12 – 2023-09-13 (×2): 40 mg via ORAL
  Filled 2023-09-11 (×4): qty 2

## 2023-09-11 MED ORDER — HYDRALAZINE HCL 20 MG/ML IJ SOLN
5.0000 mg | Freq: Four times a day (QID) | INTRAMUSCULAR | Status: DC | PRN
  Administered 2023-09-13: 5 mg via INTRAVENOUS
  Filled 2023-09-11: qty 1

## 2023-09-11 NOTE — H&P (Signed)
 History and Physical   Rodney Oneal YQM:[id number] DOB: 03-31-1956 DOA: 09/11/2023  PCP: Myrene Buddy, NP  Outpatient Specialists: Dr. Alden Hipp, cardiology Patient coming from: Home  I have personally briefly reviewed patient's old medical records in [facility] EMR.  Chief Concern: Weakness  HPI: [name] is a 68 year old male with history of hyperlipidemia, hypertension, depression, anxiety, atrial fibrillation status post ablation, on warfarin therapy, insulin-dependent diabetes mellitus, who presents emergency department for chief concerns of weakness.  Vitals in the ED showed temperature of 98.0, respiration rate of 18, heart rate 45, blood pressure 110/44, SpO2 91% on room air.  Serum sodium is 138, potassium 5.2, chloride 102, bicarb 21, BUN of 114, serum creatinine of 4.53, EGFR 13, nonfasting blood glucose 174, WBC 8.8, hemoglobin 12, platelets of 187.  High sensitive troponin is 27.  ED treatment: LR 1 L bolus. -------------------------------------- At bedside patient was able to tell me his first, last name, age, location, current calendar year.  He reports he has been feeling weakness for about 2 days.  He reports he was recently prescribed a fluid medication on 09/05/2023 and he has been taking it as prescribed.  He reports he has been urinating a lot.  He denies dysuria, hematuria, diarrhea, blood in his stool.  He denies shortness of breath, chest pain, swelling of his lower extremities.  He denies changes to his diet.  Social history: He lives at home with his wife.  He is a former tobacco user, quitting in December 2014.  He denies EtOH and recreational drug use.  He is retired and previously worked on Theatre stage manager, Automotive engineer.  ROS: Constitutional: no weight change, no fever ENT/Mouth: no sore throat, no rhinorrhea Eyes: no eye pain, no vision changes Cardiovascular: no chest pain, no dyspnea,  no edema, no palpitations Respiratory: no  cough, no sputum, no wheezing Gastrointestinal: no nausea, no vomiting, no diarrhea, no constipation Genitourinary: no urinary incontinence, no dysuria, no hematuria Musculoskeletal: no arthralgias, no myalgias Skin: no skin lesions, no pruritus, Neuro: + weakness, no loss of consciousness, no syncope Psych: no anxiety, no depression, no decrease appetite Heme/Lymph: no bruising, no bleeding  ED Course: Discussed with EDP, patient requiring hospitalization for chief concerns of acute kidney injury.  Assessment/Plan  Principal Problem:   AKI (acute kidney injury) (HCC) Active Problems:   Hyperkalemia   Insulin dependent type 2 diabetes mellitus (HCC)   Essential hypertension   Hyperlipidemia   OSA (obstructive sleep apnea)   Hypertriglyceridemia   Abdominal obesity and metabolic syndrome   Obesity, Class III, BMI 40-49.9 (morbid obesity) (HCC)   Assessment and Plan:  * AKI (acute kidney injury) (HCC) Suspect secondary to torsemide use Torsemide 40 mg daily will not be resumed on admission Status post LR 1 L bolus per EDP Continue with sodium chloride infusion at 125 mL/h, 1 day ordered  Obesity, Class III, BMI 40-49.9 (morbid obesity) (HCC) This complicates overall care and prognosis.   Abdominal obesity and metabolic syndrome Extensive discussion with patient at bedside for safe and gradual weight loss in conjunction with working with PCP Patient endorses understanding and compliance  OSA (obstructive sleep apnea) CPAP nightly ordered  Hyperlipidemia Home atorvastatin 80 mg nightly not resumed on admission, a.m. team to resume when the benefits outweigh the risk  Essential hypertension Home lisinopril 10 mg daily, diltiazem 24-hour, to 40 mg every morning, Coreg 6.25 mg p.o. twice daily with meals were not resumed on admission due to patient presenting with bradycardia  and low normotensive in setting of profound acute kidney injury and overdiuresis outpatient A.m. team  to resume home antihypertensive medications when the benefits outweigh the risk Hydralazine 5 mg IV every 6 hours as needed for SBP greater 165, 5 days ordered  Insulin dependent type 2 diabetes mellitus (HCC) Insulin SSI with at bedtime coverage ordered Home degludec 44 units not resumed on admission due to patient with profound acute kidney injury Home metformin will not be resumed on admission A.m. team to resume when the benefits outweigh the risk  Hyperkalemia Secondary to acute kidney injury, treatment per above  Chart reviewed.   DVT prophylaxis: Warfarin per pharmacy Code Status: Full code Diet: Renal/carb modified Family Communication: Updated spouse at bedside with patient's permission Disposition Plan: Pending clinical course Consults called: Pharmacy Admission status: Telemetry medical, observation  Past Medical History:  Diagnosis Date   A-fib (HCC)    Arthritis    BPH (benign prostatic hyperplasia)    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    Depression    Diabetes mellitus without complication (HCC)    ED (erectile dysfunction)    GERD (gastroesophageal reflux disease)    History of kidney stones    Hyperlipidemia    Hypertension    Obesity    Sleep apnea    Stroke [facility])    Past Surgical History:  Procedure Laterality Date   CARDIOVERSION N/A 03/27/2023   Procedure: CARDIOVERSION;  Surgeon: Antonette Batters, MD;  Location: [facility] ORS;  Service: Cardiovascular;  Laterality: N/A;   CARDIOVERSION N/A 04/19/2023   Procedure: CARDIOVERSION;  Surgeon: Antonette Batters, MD;  Location: [facility] ORS;  Service: Cardiovascular;  Laterality: N/A;   CARDIOVERSION N/A 05/10/2023   Procedure: CARDIOVERSION;  Surgeon: Isabell Manzanilla, DO;  Location: [facility] ORS;  Service: Cardiovascular;  Laterality: N/A;   COLONOSCOPY WITH PROPOFOL     COLONOSCOPY WITH PROPOFOL N/A 02/28/2019   Procedure: COLONOSCOPY WITH PROPOFOL;  Surgeon: Deveron Fly, MD;  Location:  [facility] ENDOSCOPY;  Service: Endoscopy;  Laterality: N/A;   kidney stones removed     left rotator cuff repair     right shoulder surgery     Social History:  reports that he quit smoking about 10 years ago. His smoking use included cigarettes. He started smoking about 48 years ago. He has a 37.7 pack-year smoking history. He has never used smokeless tobacco. He reports that he does not currently use alcohol. He reports that he does not currently use drugs.  Allergies  Allergen Reactions   Sulfa Antibiotics Anaphylaxis, Itching and Rash   Family History  Problem Relation Age of Onset   Diabetes Mother    Heart failure Mother    Heart attack Mother    Heart failure Father    Heart attack Brother    Heart failure Brother    Family history: Family history reviewed and not pertinent.  Prior to Admission medications   Medication Sig Start Date End Date Taking? Authorizing Provider  amiodarone (PACERONE) 200 MG tablet Take 200 mg by mouth in the morning.    [provider]  ascorbic acid (VITAMIN C) 500 MG tablet Take 500 mg by mouth in the morning.    [provider]  atorvastatin (LIPITOR) 80 MG tablet Take 80 mg by mouth at bedtime.    [provider]  carvedilol (COREG) 6.25 MG tablet Take 6.25 mg by mouth 2 (two) times daily with a meal.    [provider]  Cholecalciferol (VITAMIN D3)  50 MCG (2000 UT) capsule Take 2,000 Units by mouth in the morning.    [provider]  cyanocobalamin (VITAMIN B12) 1000 MCG tablet Take 1,000 mcg by mouth in the morning.    [provider]  diltiazem (CARDIZEM CD) 240 MG 24 hr capsule Take 240 mg by mouth in the morning.    [provider]  FLUoxetine (PROZAC) 40 MG capsule Take 40 mg by mouth in the morning.    [provider]  fluticasone (FLONASE) 50 MCG/ACT nasal spray Place 2 sprays into both nostrils daily as needed for allergies.    [provider]  insulin degludec  (TRESIBA) 200 UNIT/ML FlexTouch Pen Inject 44 Units into the skin in the morning.    [provider]  insulin lispro (HUMALOG) 100 UNIT/ML injection Inject 24-28 Units into the skin See admin instructions. Inject 28 units subcutaneously in the morning and inject 24 units subcutaneously in the evening.    [provider]  lisinopril (ZESTRIL) 10 MG tablet Take 10 mg by mouth in the morning.    [provider]  lisinopril-hydrochlorothiazide (ZESTORETIC) 20-25 MG tablet Take 1 tablet by mouth in the morning.    [provider]  Magnesium Oxide 250 MG TABS Take 250 mg by mouth in the morning.    [provider]  metFORMIN (GLUCOPHAGE) 1000 MG tablet Take 1,000 mg by mouth 2 (two) times daily with a meal.    [provider]  montelukast (SINGULAIR) 10 MG tablet Take 10 mg by mouth daily as needed (allergies/respiratory issues.).    [provider]  tamsulosin (FLOMAX) 0.4 MG CAPS capsule Take 0.4 mg by mouth at bedtime.    [provider]  warfarin (COUMADIN) 3 MG tablet Take 3 mg by mouth every morning.    [provider]  zinc gluconate 50 MG tablet Take 50 mg by mouth in the morning.    [provider]   Physical Exam: Vitals:   09/11/23 1237 09/11/23 1238 09/11/23 1522 09/11/23 1853  BP:  (!) 101/46 (!) 110/44 (!) 139/54  Pulse:  (!) 44 (!) 45 (!) 51  Resp:  17 18 18   Temp:  98 F (36.7 C) 98 F (36.7 C)   TempSrc:  Oral    SpO2:  95% 91% 94%  Weight: (!) 141.1 kg     Height: 5\' 11"  (1.803 m)      Constitutional: appears age-appropriate, NAD, calm Eyes: PERRL, lids and conjunctivae normal ENMT: Mucous membranes are moist. Posterior pharynx clear of any exudate or lesions. Age-appropriate dentition. Hearing appropriate Neck: normal, supple, no masses, no thyromegaly Respiratory: clear to auscultation bilaterally, no wheezing, no crackles. Normal respiratory effort. No accessory muscle use.   Cardiovascular: Regular rate and rhythm, no murmurs / rubs / gallops. No extremity edema. 2+ pedal pulses. No carotid bruits.  Abdomen: Morbidly obese abdomen, no tenderness, no masses palpated, no hepatosplenomegaly. Bowel sounds positive.  Musculoskeletal: no clubbing / cyanosis. No joint deformity upper and lower extremities. Good ROM, no contractures, no atrophy. Normal muscle tone.  Skin: no rashes, lesions, ulcers. No induration Neurologic: Sensation intact. Strength 5/5 in all 4.  Psychiatric: Normal judgment and insight. Alert and oriented x 3. Normal mood.   EKG: independently reviewed, showing sinus bradycardia with rate of 47, QTc 446  Chest x-ray on Admission: I personally reviewed and I agree with radiologist reading as below.  DG Chest 2 View Result Date: 09/11/2023 CLINICAL DATA:  Weakness for 2 days EXAM:  CHEST - 2 VIEW COMPARISON:  CT from 09/05/2023 FINDINGS: Cardiac shadow is within normal limits. No focal infiltrate or sizable effusion is seen. No bony abnormality is noted. No acute bony abnormality is noted. IMPRESSION: No acute abnormality noted. Electronically Signed   By: Violeta Grey M.D.   On: 09/11/2023 19:14   Labs on Admission: I have personally reviewed following labs  CBC: Recent Labs  Lab 09/11/23 1241  WBC 8.8  HGB 12.0*  HCT 40.0  MCV 99.8  PLT 187   Basic Metabolic Panel: Recent Labs  Lab 09/11/23 1241  NA 138  K 5.2*  CL 102  CO2 21*  GLUCOSE 174*  BUN 114*  CREATININE 4.53*  CALCIUM 8.9  MG 2.3  PHOS 5.9*   GFR: Estimated Creatinine Clearance: 22.7 mL/min (A) (by C-G formula based on SCr of 4.53 mg/dL (H)).  This document was prepared using Dragon Voice Recognition software and may include unintentional dictation errors.  Dr. Reinhold Carbine [facility]  If 7PM-7AM, please contact overnight-coverage provider If 7AM-7PM, please contact day attending provider [email or web address]  09/11/2023, 7:38 PM

## 2023-09-11 NOTE — Assessment & Plan Note (Signed)
 Suspect secondary to torsemide use-dose was increased recently Improving renal function, current mean currently at 2.05, baseline around 1.2. - Continue with IV fluid for another day - Monitor renal function - Avoid nephrotoxins

## 2023-09-11 NOTE — ED Triage Notes (Signed)
 Pt here with weakness x2 days. Pt states he went to his lung doctor and began getting sick. Pt has a hx of a fib and bradycardia. Pt was started on a fluid pill last Monday and believes it may have been too strong.

## 2023-09-11 NOTE — Assessment & Plan Note (Signed)
 Secondary to acute kidney injury, treatment per above

## 2023-09-11 NOTE — Assessment & Plan Note (Signed)
 CPAP nightly ordered

## 2023-09-11 NOTE — Assessment & Plan Note (Signed)
 Extensive discussion with patient at bedside for safe and gradual weight loss in conjunction with working with PCP Patient endorses understanding and compliance

## 2023-09-11 NOTE — ED Provider Notes (Signed)
 [facility] Provider Note    Event Date/Time   First MD Initiated Contact with Patient 09/11/23 1609     (approximate)   History   Weakness   HPI  Rodney Oneal is a 68 y.o. male with history of atrial fibrillation on Coumadin, hyperlipidemia, hypertension, diabetes, generalized weakness, lightheadedness.  States that he had some nausea vomiting today but no diarrhea or dysuria, no chest pain or shortness of breath or abdominal pain or back pain.  States that he felt lightheaded earlier, almost passed out but did not.  Wife noted that since he has been on the water pill, he has been voiding a lot and has had increasing weakness.  Independent history obtained from wife as above.  On independent review, he was seen by his primary care doctor in early April for shortness of breath.  Had noted worsening shortness of breath over the past week when he saw his PCP, with oxygen sats that dipped down into the 70s and 80s.  Was prescribed torsemide 20 mg 2 tabs in morning for 7 days to reduce fluid retention.     Physical Exam   Triage Vital Signs: ED Triage Vitals  Encounter Vitals Group     BP 09/11/23 1238 (!) 101/46     Systolic BP Percentile --      Diastolic BP Percentile --      Pulse Rate 09/11/23 1238 (!) 44     Resp 09/11/23 1238 17     Temp 09/11/23 1238 98 F (36.7 C)     Temp Source 09/11/23 1238 Oral     SpO2 09/11/23 1238 95 %     Weight 09/11/23 1237 (!) 311 lb 1.1 oz (141.1 kg)     Height 09/11/23 1237 5\' 11"  (1.803 m)     Head Circumference --      Peak Flow --      Pain Score 09/11/23 1239 0     Pain Loc --      Pain Education --      Exclude from Growth Chart --     Most recent vital signs: Vitals:   09/11/23 1522 09/11/23 1853  BP: (!) 110/44 (!) 139/54  Pulse: (!) 45 (!) 51  Resp: 18 18  Temp: 98 F (36.7 C)   SpO2: 91% 94%     General: Awake, no distress.  CV:  Good peripheral perfusion.  Resp:  Normal effort.  No  tachypnea or respiratory distress Abd:  No distention.  Soft nontender Other:  No lower extremity edema, no obvious unilateral calf swelling or tenderness, dry mucous membranes   ED Results / Procedures / Treatments   Labs (all labs ordered are listed, but only abnormal results are displayed) Labs Reviewed  BASIC METABOLIC PANEL WITH GFR - Abnormal; Notable for the following components:      Result Value   Potassium 5.2 (*)    CO2 21 (*)    Glucose, Bld 174 (*)    BUN 114 (*)    Creatinine, Ser 4.53 (*)    GFR, Estimated 13 (*)    All other components within normal limits  CBC - Abnormal; Notable for the following components:   RBC 4.01 (*)    Hemoglobin 12.0 (*)    RDW 18.3 (*)    All other components within normal limits  PHOSPHORUS - Abnormal; Notable for the following components:   Phosphorus 5.9 (*)    All other components within normal limits  PROTIME-INR - Abnormal; Notable for the following components:   Prothrombin Time 44.4 (*)    INR 4.7 (*)    All other components within normal limits  TROPONIN I (HIGH SENSITIVITY) - Abnormal; Notable for the following components:   Troponin I (High Sensitivity) 27 (*)    All other components within normal limits  RESP PANEL BY RT-PCR (RSV, FLU A&B, COVID)  RVPGX2  MAGNESIUM  URINALYSIS, ROUTINE W REFLEX MICROSCOPIC  BASIC METABOLIC PANEL WITH GFR  CBC  CBG MONITORING, ED     EKG  Sinus rhythm cardia, rate of 47, normal QS, normal QTc, T wave flattening to aVF, T wave version to 3, distention compared to prior since prior EKG showed atrial fibrillation   RADIOLOGY Chest x-ray on my independent interpretation without obvious consolidation.   PROCEDURES:  Critical Care performed: No  Procedures   MEDICATIONS ORDERED IN ED: Medications  amiodarone (PACERONE) tablet 200 mg (has no administration in time range)  tamsulosin (FLOMAX) capsule 0.4 mg (has no administration in time range)  acetaminophen (TYLENOL)  tablet 650 mg (has no administration in time range)    Or  acetaminophen (TYLENOL) suppository 650 mg (has no administration in time range)  ondansetron (ZOFRAN) tablet 4 mg (has no administration in time range)    Or  ondansetron (ZOFRAN) injection 4 mg (has no administration in time range)  heparin injection 5,000 Units (has no administration in time range)  0.9 %  sodium chloride infusion ( Intravenous New Bag/Given 09/11/23 1853)  hydrALAZINE (APRESOLINE) injection 5 mg (has no administration in time range)  lactated ringers bolus 1,000 mL (1,000 mLs Intravenous New Bag/Given 09/11/23 1716)     IMPRESSION / MDM / ASSESSMENT AND PLAN / ED COURSE  I reviewed the triage vital signs and the nursing notes.                              Differential diagnosis includes, but is not limited to, dehydration, overdiuresis, electrolyte derangements, atypical ACS, infection such as UTI or pneumonia or viral illness.  Will get labs, EKG, troponin, chest x-ray, UA.  Given that he looks volume down on exam, will give him some IV fluids.  Patient's presentation is most consistent with acute presentation with potential threat to life or bodily function.  Independent review of labs and imaging are below.  Given profound AKI as well as electrolyte derangements and generalized weakness, he will need to be admitted for further management.  Consulted hospitalist who is agreeable with the plan for admission and will evaluate the patient.  Shared decision making done with patient and wife and they are agreeable plan for admission.  He is admitted.  Clinical Course as of 09/11/23 1930  Mon Sep 11, 2023  1610 Independent review of labs, mildly elevated Theodis Aguas but he has no chest pain at this time, AKI noted on BMP with mildly elevated potassium, phosphorus is also elevated, no leukocytosis.  Patient is getting fluids at this time for the AKI as well as hyperphosphatemia. [TT]  1930 DG Chest 2 View No acute  abnormality noted.  [TT]    Clinical Course User Index [TT] Jodie Echevaria, Franchot Erichsen, MD     FINAL CLINICAL IMPRESSION(S) / ED DIAGNOSES   Final diagnoses:  Weakness  Dehydration  Hyperphosphatemia  AKI (acute kidney injury) (HCC)     Rx / DC Orders   ED Discharge Orders     None  Note:  This document was prepared using Dragon voice recognition software and may include unintentional dictation errors.    Shane Darling, MD 09/11/23 [phone]

## 2023-09-11 NOTE — Assessment & Plan Note (Addendum)
 Home lisinopril 10 mg daily, diltiazem 24-hour, to 40 mg every morning, Coreg 6.25 mg p.o. twice daily with meals were not resumed on admission due to patient presenting with bradycardia and low normotensive in setting of profound acute kidney injury and overdiuresis outpatient A.m. team to resume home antihypertensive medications when the benefits outweigh the risk Hydralazine 5 mg IV every 6 hours as needed for SBP greater 165, 5 days ordered

## 2023-09-11 NOTE — Hospital Course (Signed)
 [name] is a 68 year old male with history of hyperlipidemia, hypertension, depression, anxiety, atrial fibrillation status post ablation, on warfarin therapy, insulin-dependent diabetes mellitus, who presents emergency department for chief concerns of weakness.  Vitals in the ED showed temperature of 98.0, respiration rate of 18, heart rate 45, blood pressure 110/44, SpO2 91% on room air.  Serum sodium is 138, potassium 5.2, chloride 102, bicarb 21, BUN of 114, serum creatinine of 4.53, EGFR 13, nonfasting blood glucose 174, WBC 8.8, hemoglobin 12, platelets of 187.  High sensitive troponin is 27.  ED treatment: LR 1 L bolus.

## 2023-09-11 NOTE — Assessment & Plan Note (Signed)
 -  This complicates overall care and prognosis.

## 2023-09-11 NOTE — Assessment & Plan Note (Signed)
 Patient apparently was not taking his home atorvastatin -Need to discuss with PCP

## 2023-09-11 NOTE — Consult Note (Signed)
 PHARMACY - ANTICOAGULATION CONSULT NOTE  Pharmacy Consult for Warfarin Indication: atrial fibrillation  Allergies  Allergen Reactions   Sulfa Antibiotics Anaphylaxis, Itching and Rash    Patient Measurements: Height: 5\' 11"  (180.3 cm) Weight: (!) 141.1 kg (311 lb 1.1 oz) IBW/kg (Calculated) : 75.3 HEPARIN DW (KG): 108.2  Vital Signs: Temp: 98 F (36.7 C) (04/14 1522) Temp Source: Oral (04/14 1238) BP: 139/54 (04/14 1853) Pulse Rate: 51 (04/14 1853)  Labs: Recent Labs    09/11/23 1241 09/11/23 1627  HGB 12.0*  --   HCT 40.0  --   PLT 187  --   LABPROT  --  44.4*  INR  --  4.7*  CREATININE 4.53*  --   TROPONINIHS 27*  --     Estimated Creatinine Clearance: 22.7 mL/min (A) (by C-G formula based on SCr of 4.53 mg/dL (H)).   Medical History: Past Medical History:  Diagnosis Date   A-fib (HCC)    Arthritis    BPH (benign prostatic hyperplasia)    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    Depression    Diabetes mellitus without complication (HCC)    ED (erectile dysfunction)    GERD (gastroesophageal reflux disease)    History of kidney stones    Hyperlipidemia    Hypertension    Obesity    Sleep apnea    Stroke (HCC)     Medications:  Warfarin 3mg  daily. Last dose: morning of 4/14  Assessment: 68 year old male with history of hyperlipidemia, hypertension, depression, anxiety, atrial fibrillation status post ablation, on warfarin therapy, insulin-dependent diabetes mellitus, who presents emergency department for chief concerns of weakness. Patient has been feeling week for approximately 2 days. EDP plans to admit patient and pharmacy has been consulted to monitor and resume patient's home warfarin.  Goal of Therapy:  INR 2-3 Monitor platelets by anticoagulation protocol: Yes  Date: INR:  Dose: 4/14 4.7 3mg (patient had already taken prior to being admitted)   Plan:  - INR elevated s/p patient already taking his warfarin dose for today - No  signs or symptoms of bleeding noted, therefore will abstain from order reversal agent and/or IV vitamin K - Recheck INR tomorrow with morning labs - CBC daily   Rodney Oneal A Rodney Oneal 09/11/2023,7:42 PM

## 2023-09-11 NOTE — Assessment & Plan Note (Signed)
 Takes Tresiba and metformin at home.  A1c of 6.1 - Continue with SSI

## 2023-09-12 DIAGNOSIS — Z794 Long term (current) use of insulin: Secondary | ICD-10-CM | POA: Diagnosis not present

## 2023-09-12 DIAGNOSIS — E119 Type 2 diabetes mellitus without complications: Secondary | ICD-10-CM | POA: Diagnosis present

## 2023-09-12 DIAGNOSIS — I480 Paroxysmal atrial fibrillation: Secondary | ICD-10-CM | POA: Diagnosis present

## 2023-09-12 DIAGNOSIS — Z1152 Encounter for screening for COVID-19: Secondary | ICD-10-CM | POA: Diagnosis not present

## 2023-09-12 DIAGNOSIS — G4733 Obstructive sleep apnea (adult) (pediatric): Secondary | ICD-10-CM | POA: Diagnosis present

## 2023-09-12 DIAGNOSIS — I10 Essential (primary) hypertension: Secondary | ICD-10-CM | POA: Diagnosis not present

## 2023-09-12 DIAGNOSIS — Z6841 Body Mass Index (BMI) 40.0 and over, adult: Secondary | ICD-10-CM | POA: Diagnosis not present

## 2023-09-12 DIAGNOSIS — E860 Dehydration: Secondary | ICD-10-CM | POA: Diagnosis present

## 2023-09-12 DIAGNOSIS — Z7984 Long term (current) use of oral hypoglycemic drugs: Secondary | ICD-10-CM | POA: Diagnosis not present

## 2023-09-12 DIAGNOSIS — E66813 Obesity, class 3: Secondary | ICD-10-CM | POA: Diagnosis present

## 2023-09-12 DIAGNOSIS — R531 Weakness: Secondary | ICD-10-CM | POA: Diagnosis not present

## 2023-09-12 DIAGNOSIS — Z79899 Other long term (current) drug therapy: Secondary | ICD-10-CM | POA: Diagnosis not present

## 2023-09-12 DIAGNOSIS — N179 Acute kidney failure, unspecified: Secondary | ICD-10-CM | POA: Diagnosis present

## 2023-09-12 DIAGNOSIS — J449 Chronic obstructive pulmonary disease, unspecified: Secondary | ICD-10-CM | POA: Diagnosis present

## 2023-09-12 DIAGNOSIS — E781 Pure hyperglyceridemia: Secondary | ICD-10-CM | POA: Diagnosis present

## 2023-09-12 DIAGNOSIS — E875 Hyperkalemia: Secondary | ICD-10-CM | POA: Diagnosis present

## 2023-09-12 DIAGNOSIS — Z8673 Personal history of transient ischemic attack (TIA), and cerebral infarction without residual deficits: Secondary | ICD-10-CM | POA: Diagnosis not present

## 2023-09-12 DIAGNOSIS — I110 Hypertensive heart disease with heart failure: Secondary | ICD-10-CM | POA: Diagnosis present

## 2023-09-12 DIAGNOSIS — N400 Enlarged prostate without lower urinary tract symptoms: Secondary | ICD-10-CM | POA: Diagnosis present

## 2023-09-12 DIAGNOSIS — Z7901 Long term (current) use of anticoagulants: Secondary | ICD-10-CM | POA: Diagnosis not present

## 2023-09-12 DIAGNOSIS — Z8249 Family history of ischemic heart disease and other diseases of the circulatory system: Secondary | ICD-10-CM | POA: Diagnosis not present

## 2023-09-12 DIAGNOSIS — Z833 Family history of diabetes mellitus: Secondary | ICD-10-CM | POA: Diagnosis not present

## 2023-09-12 DIAGNOSIS — I5032 Chronic diastolic (congestive) heart failure: Secondary | ICD-10-CM | POA: Diagnosis present

## 2023-09-12 DIAGNOSIS — Z87891 Personal history of nicotine dependence: Secondary | ICD-10-CM | POA: Diagnosis not present

## 2023-09-12 LAB — GLUCOSE, CAPILLARY
Glucose-Capillary: 157 mg/dL — ABNORMAL HIGH (ref 70–99)
Glucose-Capillary: 207 mg/dL — ABNORMAL HIGH (ref 70–99)
Glucose-Capillary: 189 mg/dL — ABNORMAL HIGH (ref 70–99)
Glucose-Capillary: 176 mg/dL — ABNORMAL HIGH (ref 70–99)

## 2023-09-12 LAB — BASIC METABOLIC PANEL WITH GFR
Anion gap: 10 (ref 5–15)
BUN: 115 mg/dL — ABNORMAL HIGH (ref 8–23)
CO2: 23 mmol/L (ref 22–32)
Calcium: 8.6 mg/dL — ABNORMAL LOW (ref 8.9–10.3)
Chloride: 104 mmol/L (ref 98–111)
Creatinine, Ser: 3.45 mg/dL — ABNORMAL HIGH (ref 0.61–1.24)
GFR, Estimated: 19 mL/min — ABNORMAL LOW (ref >60)
Glucose, Bld: 157 mg/dL — ABNORMAL HIGH (ref 70–99)
Potassium: 3.8 mmol/L (ref 3.5–5.1)
Sodium: 137 mmol/L (ref 135–145)

## 2023-09-12 LAB — CBC
HCT: 35.2 % — ABNORMAL LOW (ref 39.0–52.0)
Hemoglobin: 11.3 g/dL — ABNORMAL LOW (ref 13.0–17.0)
MCH: 30.3 pg (ref 26.0–34.0)
MCHC: 32.1 g/dL (ref 30.0–36.0)
MCV: 94.4 fL (ref 80.0–100.0)
Platelets: 165 10*3/uL (ref 150–400)
RBC: 3.73 MIL/uL — ABNORMAL LOW (ref 4.22–5.81)
RDW: 18.3 % — ABNORMAL HIGH (ref 11.5–15.5)
WBC: 6.9 10*3/uL (ref 4.0–10.5)
nRBC: 0 % (ref 0.0–0.2)

## 2023-09-12 LAB — PROTIME-INR
INR: 4.9 (ref 0.8–1.2)
Prothrombin Time: 46 s — ABNORMAL HIGH (ref 11.4–15.2)

## 2023-09-12 LAB — HEMOGLOBIN A1C
Hgb A1c MFr Bld: 6.1 % — ABNORMAL HIGH (ref 4.8–5.6)
Mean Plasma Glucose: 128.37 mg/dL

## 2023-09-12 MED ORDER — WARFARIN - PHARMACIST DOSING INPATIENT: Freq: Every day | Status: DC

## 2023-09-12 MED ORDER — LACTATED RINGERS IV SOLN: INTRAVENOUS | Status: DC

## 2023-09-12 NOTE — Consult Note (Signed)
 PHARMACY - ANTICOAGULATION CONSULT NOTE  Pharmacy Consult for Warfarin Indication: atrial fibrillation  Allergies  Allergen Reactions   Sulfa Antibiotics Anaphylaxis, Itching and Rash    Patient Measurements: Height: 5\' 11"  (180.3 cm) Weight: (!) 141.1 kg (311 lb 1.1 oz) IBW/kg (Calculated) : 75.3 HEPARIN DW (KG): 108.2  Vital Signs: Temp: 98 F (36.7 C) (04/15 0803) BP: 114/66 (04/15 0803) Pulse Rate: 55 (04/15 0803)  Labs: Recent Labs    09/11/23 1241 09/11/23 1627 09/12/23 0514  HGB 12.0*  --  11.3*  HCT 40.0  --  35.2*  PLT 187  --  165  LABPROT  --  44.4* 46.0*  INR  --  4.7* 4.9*  CREATININE 4.53*  --  3.45*  TROPONINIHS 27*  --   --     Estimated Creatinine Clearance: 29.9 mL/min (A) (by C-G formula based on SCr of 3.45 mg/dL (H)).   Medical History: Past Medical History:  Diagnosis Date   A-fib (HCC)    Arthritis    BPH (benign prostatic hyperplasia)    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    Depression    Diabetes mellitus without complication (HCC)    ED (erectile dysfunction)    GERD (gastroesophageal reflux disease)    History of kidney stones    Hyperlipidemia    Hypertension    Obesity    Sleep apnea    Stroke (HCC)     Medications:  PTA: Warfarin 3mg  daily. Last dose: morning of 4/14  Assessment: 68 year old male with history of hyperlipidemia, hypertension, depression, anxiety, atrial fibrillation status post ablation, on warfarin therapy, insulin-dependent diabetes mellitus, who presents emergency department for chief concerns of weakness. Patient has been feeling week for approximately 2 days. EDP plans to admit patient and pharmacy has been consulted to monitor and resume patient's home warfarin.  DDI: amiodarone (on PTA)  Goal of Therapy:  INR 2-3 Monitor platelets by anticoagulation protocol: Yes  Date: INR:  Dose: 4/14 4.7 3mg (patient had already taken prior to being admitted) 4/15 4.9 HOLD   Plan:  - INR  supratherapeutic - Hold warfarin today - Recheck INR tomorrow with morning labs - CBC daily   Thomasine Flick PharmD Clinical Pharmacist 09/12/2023

## 2023-09-12 NOTE — Progress Notes (Addendum)
 Progress Note    KORBEN CARCIONE  ZOX:[id number] DOB: 27-Oct-1955  DOA: 09/11/2023 PCP: Myrene Buddy, NP      Brief Narrative:    Medical records reviewed and are as summarized below:  CHRISTINA WALDROP is a 68 y.o. male with history of hyperlipidemia, hypertension, depression, anxiety, atrial fibrillation status post ablation, on warfarin therapy, insulin-dependent diabetes mellitus, chronic HFpEF, COPD, history of stroke, who presented to the hospital with general weakness.  He also had nausea and vomiting on the day of admission.  He said he was taking 1 tablet of torsemide daily at home.  Chart review shows that his PCP prescribed 40 mg of torsemide daily on 09/05/2023.  However, he said he was asked to take 3 tablets recently.    He was admitted to the hospital with AKI.    Assessment/Plan:   Principal Problem:   AKI (acute kidney injury) (HCC) Active Problems:   Hyperkalemia   Insulin dependent type 2 diabetes mellitus (HCC)   Essential hypertension   Hyperlipidemia   OSA (obstructive sleep apnea)   Hypertriglyceridemia   Abdominal obesity and metabolic syndrome   Obesity, Class III, BMI 40-49.9 (morbid obesity) (HCC)    Body mass index is 43.39 kg/m.  (Morbid obesity)   AKI: Continue IV fluids for hydration.  Creatinine is down from 4.53-3.45. BUN significantly elevated at 115.  Torsemide, lisinopril and HCTZ have been held. Hyperkalemia: Improved. Chart review shows that on 08/21/2023, BUN was 24, creatinine was 1.2   Hypertension: He has torsemide, lisinopril and lisinopril-HCTZ on his admission med rec.  All of these have been held.   Paroxysmal atrial fibrillation, sinus bradycardia: Continue amiodarone. Carvedilol on hold because of bradycardia. Warfarin on hold because of supratherapeutic INR. S/p A-fib ablation on 08/15/2023.   Type II DM with history of hypoglycemic episodes in the past: NovoLog as needed for hyperglycemia. Evaristo Bury and  metformin have been held Hemoglobin A1c 6.1   Chronic diastolic CHF: Compensated   COPD: Compensated TEE on 08/14/2023 showed normal LV systolic function.   Comorbidities include OSA on CPAP, hyperlipidemia, mild cognitive impairment, history of stroke in 2014, depression, GERD, BP pH    Diet Order             Diet renal/carb modified with fluid restriction Diet-HS Snack? Nothing; Fluid restriction: Other (see comments); Room service appropriate? Yes; Fluid consistency: Thin  Diet effective now                            Consultants: None  Procedures: None    Medications:    amiodarone  200 mg Oral q AM   ascorbic acid  500 mg Oral q AM   cholecalciferol  2,000 Units Oral q AM   cyanocobalamin  1,000 mcg Oral q AM   FLUoxetine  40 mg Oral Daily   insulin aspart  0-5 Units Subcutaneous QHS   insulin aspart  0-9 Units Subcutaneous TID WC   tamsulosin  0.4 mg Oral QHS   Warfarin - Pharmacist Dosing Inpatient   Does not apply q1600   Continuous Infusions:  sodium chloride 125 mL/hr at 09/12/23 0427     Anti-infectives (From admission, onward)    None              Family Communication/Anticipated D/C date and plan/Code Status   DVT prophylaxis: Place TED hose Start: 09/11/23 1743     Code Status: Full Code  Family Communication: Plan discussed with his wife and daughter at the bedside Disposition Plan: Plan to discharge home   Status is: Observation The patient will require care spanning > 2 midnights and should be moved to inpatient because: AKI       Subjective:   Interval events noted.  No complaints.  No vomiting, diarrhea or abdominal pain.  No shortness of breath or chest pain.  Objective:    Vitals:   09/11/23 1853 09/11/23 2043 09/11/23 2345 09/12/23 0803  BP: (!) 139/54 (!) 172/66  114/66  Pulse: (!) 51 (!) 57  (!) 55  Resp: 18 16 16 16   Temp:  98 F (36.7 C)  98 F (36.7 C)  TempSrc:  Oral    SpO2: 94%  92%  92%  Weight:      Height:       No data found.   Intake/Output Summary (Last 24 hours) at 09/12/2023 1433 Last data filed at 09/12/2023 0429 Gross per 24 hour  Intake --  Output 900 ml  Net -900 ml   Filed Weights   09/11/23 1237  Weight: (!) 141.1 kg    Exam:  GEN: NAD SKIN: Warm and dry EYES: No pallor or icterus ENT: MMM CV: RRR PULM: CTA B ABD: soft, obese, NT, +BS CNS: AAO x 3, non focal EXT: No edema or tenderness        Data Reviewed:   I have personally reviewed following labs and imaging studies:  Labs: Labs show the following:   Basic Metabolic Panel: Recent Labs  Lab 09/11/23 1241 09/12/23 0514  NA 138 137  K 5.2* 3.8  CL 102 104  CO2 21* 23  GLUCOSE 174* 157*  BUN 114* 115*  CREATININE 4.53* 3.45*  CALCIUM 8.9 8.6*  MG 2.3  --   PHOS 5.9*  --    GFR Estimated Creatinine Clearance: 29.9 mL/min (A) (by C-G formula based on SCr of 3.45 mg/dL (H)). Liver Function Tests: No results for input(s): "AST", "ALT", "ALKPHOS", "BILITOT", "PROT", "ALBUMIN" in the last 168 hours. No results for input(s): "LIPASE", "AMYLASE" in the last 168 hours. No results for input(s): "AMMONIA" in the last 168 hours. Coagulation profile Recent Labs  Lab 09/11/23 1627 09/12/23 0514  INR 4.7* 4.9*    CBC: Recent Labs  Lab 09/11/23 1241 09/12/23 0514  WBC 8.8 6.9  HGB 12.0* 11.3*  HCT 40.0 35.2*  MCV 99.8 94.4  PLT 187 165   Cardiac Enzymes: No results for input(s): "CKTOTAL", "CKMB", "CKMBINDEX", "TROPONINI" in the last 168 hours. BNP (last 3 results) No results for input(s): "PROBNP" in the last 8760 hours. CBG: Recent Labs  Lab 09/11/23 2053 09/12/23 0802 09/12/23 1118  GLUCAP 195* 157* 207*   D-Dimer: No results for input(s): "DDIMER" in the last 72 hours. Hgb A1c: Recent Labs    09/12/23 0514  HGBA1C 6.1*   Lipid Profile: No results for input(s): "CHOL", "HDL", "LDLCALC", "TRIG", "CHOLHDL", "LDLDIRECT" in the last 72  hours. Thyroid function studies: No results for input(s): "TSH", "T4TOTAL", "T3FREE", "THYROIDAB" in the last 72 hours.  Invalid input(s): "FREET3" Anemia work up: No results for input(s): "VITAMINB12", "FOLATE", "FERRITIN", "TIBC", "IRON", "RETICCTPCT" in the last 72 hours. Sepsis Labs: Recent Labs  Lab 09/11/23 1241 09/12/23 0514  WBC 8.8 6.9    Microbiology Recent Results (from the past 240 hours)  Resp panel by RT-PCR (RSV, Flu A&B, Covid) Anterior Nasal Swab     Status: None   Collection Time: 09/11/23  4:11 PM   Specimen: Anterior Nasal Swab  Result Value Ref Range Status   SARS Coronavirus 2 by RT PCR NEGATIVE NEGATIVE Final    Comment: (NOTE) SARS-CoV-2 target nucleic acids are NOT DETECTED.  The SARS-CoV-2 RNA is generally detectable in upper respiratory specimens during the acute phase of infection. The lowest concentration of SARS-CoV-2 viral copies this assay can detect is 138 copies/mL. A negative result does not preclude SARS-Cov-2 infection and should not be used as the sole basis for treatment or other patient management decisions. A negative result may occur with  improper specimen collection/handling, submission of specimen other than nasopharyngeal swab, presence of viral mutation(s) within the areas targeted by this assay, and inadequate number of viral copies(<138 copies/mL). A negative result must be combined with clinical observations, patient history, and epidemiological information. The expected result is Negative.  Fact Sheet for Patients:  BloggerCourse.com  Fact Sheet for Healthcare Providers:  SeriousBroker.it  This test is no t yet approved or cleared by the [place]  FDA and  has been authorized for detection and/or diagnosis of SARS-CoV-2 by FDA under an Emergency Use Authorization (EUA). This EUA will remain  in effect (meaning this test can be used) for the duration of the COVID-19  declaration under Section 564(b)(1) of the Act, 21 U.S.C.section 360bbb-3(b)(1), unless the authorization is terminated  or revoked sooner.       Influenza A by PCR NEGATIVE NEGATIVE Final   Influenza B by PCR NEGATIVE NEGATIVE Final    Comment: (NOTE) The Xpert Xpress SARS-CoV-2/FLU/RSV plus assay is intended as an aid in the diagnosis of influenza from Nasopharyngeal swab specimens and should not be used as a sole basis for treatment. Nasal washings and aspirates are unacceptable for Xpert Xpress SARS-CoV-2/FLU/RSV testing.  Fact Sheet for Patients: BloggerCourse.com  Fact Sheet for Healthcare Providers: SeriousBroker.it  This test is not yet approved or cleared by the [place]  FDA and has been authorized for detection and/or diagnosis of SARS-CoV-2 by FDA under an Emergency Use Authorization (EUA). This EUA will remain in effect (meaning this test can be used) for the duration of the COVID-19 declaration under Section 564(b)(1) of the Act, 21 U.S.C. section 360bbb-3(b)(1), unless the authorization is terminated or revoked.     Resp Syncytial Virus by PCR NEGATIVE NEGATIVE Final    Comment: (NOTE) Fact Sheet for Patients: BloggerCourse.com  Fact Sheet for Healthcare Providers: SeriousBroker.it  This test is not yet approved or cleared by the [place]  FDA and has been authorized for detection and/or diagnosis of SARS-CoV-2 by FDA under an Emergency Use Authorization (EUA). This EUA will remain in effect (meaning this test can be used) for the duration of the COVID-19 declaration under Section 564(b)(1) of the Act, 21 U.S.C. section 360bbb-3(b)(1), unless the authorization is terminated or revoked.  Performed at [facility], [address] [place], [place] [address]     Procedures and diagnostic studies:  DG Chest 2 View Result Date:  09/11/2023 CLINICAL DATA:  Weakness for 2 days EXAM: CHEST - 2 VIEW COMPARISON:  CT from 09/05/2023 FINDINGS: Cardiac shadow is within normal limits. No focal infiltrate or sizable effusion is seen. No bony abnormality is noted. No acute bony abnormality is noted. IMPRESSION: No acute abnormality noted. Electronically Signed   By: Violeta Grey M.D.   On: 09/11/2023 19:14               LOS: 0 days   Takeem Krotzer  [facility]   Pager  on [email or web address]. If 7PM-7AM, please contact night-coverage at [email or web address]     09/12/2023, 2:33 PM

## 2023-09-13 DIAGNOSIS — N179 Acute kidney failure, unspecified: Secondary | ICD-10-CM | POA: Diagnosis not present

## 2023-09-13 DIAGNOSIS — I10 Essential (primary) hypertension: Secondary | ICD-10-CM

## 2023-09-13 DIAGNOSIS — E785 Hyperlipidemia, unspecified: Secondary | ICD-10-CM

## 2023-09-13 DIAGNOSIS — G4733 Obstructive sleep apnea (adult) (pediatric): Secondary | ICD-10-CM

## 2023-09-13 DIAGNOSIS — E119 Type 2 diabetes mellitus without complications: Secondary | ICD-10-CM

## 2023-09-13 DIAGNOSIS — Z794 Long term (current) use of insulin: Secondary | ICD-10-CM

## 2023-09-13 DIAGNOSIS — I480 Paroxysmal atrial fibrillation: Secondary | ICD-10-CM | POA: Diagnosis not present

## 2023-09-13 DIAGNOSIS — E669 Obesity, unspecified: Secondary | ICD-10-CM

## 2023-09-13 DIAGNOSIS — E875 Hyperkalemia: Secondary | ICD-10-CM | POA: Diagnosis not present

## 2023-09-13 DIAGNOSIS — E8881 Metabolic syndrome: Secondary | ICD-10-CM

## 2023-09-13 DIAGNOSIS — I4891 Unspecified atrial fibrillation: Secondary | ICD-10-CM

## 2023-09-13 LAB — BASIC METABOLIC PANEL WITH GFR
Anion gap: 8 (ref 5–15)
BUN: 78 mg/dL — ABNORMAL HIGH (ref 8–23)
CO2: 24 mmol/L (ref 22–32)
Calcium: 8.9 mg/dL (ref 8.9–10.3)
Chloride: 109 mmol/L (ref 98–111)
Creatinine, Ser: 2.05 mg/dL — ABNORMAL HIGH (ref 0.61–1.24)
GFR, Estimated: 35 mL/min — ABNORMAL LOW (ref >60)
Glucose, Bld: 174 mg/dL — ABNORMAL HIGH (ref 70–99)
Potassium: 3.8 mmol/L (ref 3.5–5.1)
Sodium: 141 mmol/L (ref 135–145)

## 2023-09-13 LAB — GLUCOSE, CAPILLARY
Glucose-Capillary: 187 mg/dL — ABNORMAL HIGH (ref 70–99)
Glucose-Capillary: 140 mg/dL — ABNORMAL HIGH (ref 70–99)
Glucose-Capillary: 108 mg/dL — ABNORMAL HIGH (ref 70–99)
Glucose-Capillary: 173 mg/dL — ABNORMAL HIGH (ref 70–99)

## 2023-09-13 LAB — PROTIME-INR
INR: 3.5 — ABNORMAL HIGH (ref 0.8–1.2)
Prothrombin Time: 35.1 s — ABNORMAL HIGH (ref 11.4–15.2)

## 2023-09-13 MED ORDER — AMLODIPINE BESYLATE 10 MG PO TABS
10.0000 mg | ORAL_TABLET | Freq: Every day | ORAL | Status: DC
  Administered 2023-09-13 – 2023-09-14 (×2): 10 mg via ORAL
  Filled 2023-09-13 (×2): qty 1

## 2023-09-13 MED ORDER — LACTATED RINGERS IV SOLN: INTRAVENOUS | Status: DC

## 2023-09-13 NOTE — Assessment & Plan Note (Signed)
 Patient with history of paroxysmal atrial fibrillation. -Continue home amiodarone -Coumadin per pharmacy-INR currently supratherapeutic but improving at 3.5 today

## 2023-09-13 NOTE — Consult Note (Signed)
 PHARMACY - ANTICOAGULATION CONSULT NOTE  Pharmacy Consult for Warfarin Indication: atrial fibrillation  Allergies  Allergen Reactions   Sulfa Antibiotics Anaphylaxis, Itching and Rash    Patient Measurements: Height: 5\' 11"  (180.3 cm) Weight: (!) 141.1 kg (311 lb 1.1 oz) IBW/kg (Calculated) : 75.3 HEPARIN DW (KG): 108.2  Vital Signs: Temp: 97.9 F (36.6 C) (04/16 0517) Temp Source: Oral (04/16 0517) BP: 150/69 (04/16 0517) Pulse Rate: 61 (04/16 0517)  Labs: Recent Labs    09/11/23 1241 09/11/23 1627 09/12/23 0514 09/13/23 0549  HGB 12.0*  --  11.3*  --   HCT 40.0  --  35.2*  --   PLT 187  --  165  --   LABPROT  --  44.4* 46.0* 35.1*  INR  --  4.7* 4.9* 3.5*  CREATININE 4.53*  --  3.45* 2.05*  TROPONINIHS 27*  --   --   --     Estimated Creatinine Clearance: 50.2 mL/min (A) (by C-G formula based on SCr of 2.05 mg/dL (H)).   Medical History: Past Medical History:  Diagnosis Date   A-fib (HCC)    Arthritis    BPH (benign prostatic hyperplasia)    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    Depression    Diabetes mellitus without complication (HCC)    ED (erectile dysfunction)    GERD (gastroesophageal reflux disease)    History of kidney stones    Hyperlipidemia    Hypertension    Obesity    Sleep apnea    Stroke (HCC)     Medications:  PTA: Warfarin 3mg  daily. Last dose: morning of 4/14  Assessment: 68 year old male with history of hyperlipidemia, hypertension, depression, anxiety, atrial fibrillation status post ablation, on warfarin therapy, insulin-dependent diabetes mellitus, who presents emergency department for chief concerns of weakness. Patient has been feeling week for approximately 2 days. EDP plans to admit patient and pharmacy has been consulted to monitor and resume patient's home warfarin.  DDI: amiodarone (on PTA)  Goal of Therapy:  INR 2-3 Monitor platelets by anticoagulation protocol: Yes  Date: INR:   Dose: 4/14 4.7 3mg (patient had already taken prior to being admitted) 4/15 4.9 HOLD 4/16 3.5 HOLD   Plan:  - INR supratherapeutic - Hold warfarin today - Recheck INR tomorrow with morning labs - CBC daily   Barney Boozer, PharmD, BCPS Clinical Pharmacist 09/13/2023

## 2023-09-13 NOTE — Progress Notes (Signed)
  Progress Note   Patient: Rodney Oneal:[id number] DOB: 12/24/1955 DOA: 09/11/2023     1 DOS: the patient was seen and examined on 09/13/2023   Brief hospital course: Mr. Keaden Gunnoe is a 68 year old male with history of hyperlipidemia, hypertension, depression, anxiety, atrial fibrillation status post ablation, on warfarin therapy, insulin-dependent diabetes mellitus, who presents emergency department for chief concerns of weakness.  Hemodynamically stable on presentation.  Labs with Serum sodium is 138, potassium 5.2, chloride 102, bicarb 21, BUN of 114, serum creatinine of 4.53, EGFR 13, nonfasting blood glucose 174, WBC 8.8, hemoglobin 12, platelets of 187. High sensitive troponin is 27.  Patient recently increased his home dose of torsemide. Admitted for AKI and hyperkalemia.  4/16: Blood pressure started trending up, at 150/69.  Starting on amlodipine. Slowly improving creatinine now at 2.05.  Hyperkalemia has resolved.  INR at 3.5.   Assessment and Plan: * AKI (acute kidney injury) (HCC) Suspect secondary to torsemide use-dose was increased recently Improving renal function, current mean currently at 2.05, baseline around 1.2. - Continue with IV fluid for another day - Monitor renal function - Avoid nephrotoxins   Hyperkalemia Secondary to acute kidney injury,  Resolved.  Essential hypertension Blood pressure started trending up. Holding home lisinopril, HCTZ, Coreg and torsemide. - Start him on amlodipine - Monitor blood pressure   A-fib (HCC) Patient with history of paroxysmal atrial fibrillation. -Continue home amiodarone -Coumadin per pharmacy-INR currently supratherapeutic but improving at 3.5 today  Insulin dependent type 2 diabetes mellitus (HCC) Takes Evaristo Bury and metformin at home.  A1c of 6.1 - Continue with SSI   Hyperlipidemia Patient apparently was not taking his home atorvastatin -Need to discuss with PCP  Abdominal obesity and metabolic  syndrome Extensive discussion with patient at bedside for safe and gradual weight loss in conjunction with working with PCP Patient endorses understanding and compliance  Obesity, Class III, BMI 40-49.9 (morbid obesity) (HCC) This complicates overall care and prognosis.   OSA (obstructive sleep apnea) CPAP nightly ordered   Subjective: Patient was sitting comfortably in chair and eating lunch.  No new concern.  Wife at bedside  Physical Exam: Vitals:   09/12/23 2116 09/12/23 2248 09/13/23 0517 09/13/23 0830  BP: (!) 159/67  (!) 150/69 (!) (P) 150/74  Pulse: (!) 59 61 61 (P) 70  Resp: 20 20 20    Temp:   97.9 F (36.6 C) (P) 98.5 F (36.9 C)  TempSrc:   Oral (P) Oral  SpO2: 92%  90% (P) 94%  Weight:      Height:       General.  Obese gentleman, in no acute distress. Pulmonary.  Lungs clear bilaterally, normal respiratory effort. CV.  Regular rate and rhythm, no JVD, rub or murmur. Abdomen.  Soft, nontender, nondistended, BS positive. CNS.  Alert and oriented .  No focal neurologic deficit. Extremities.  No edema, no cyanosis, pulses intact and symmetrical. Psychiatry.  Judgment and insight appears normal.   Data Reviewed: Prior data reviewed  Family Communication: With wife at bedside  Disposition: Status is: Inpatient Remains inpatient appropriate because: Need more IV fluid and improved renal function  Planned Discharge Destination: Home  Time spent: 50 minutes  This record has been created using Conservation officer, historic buildings. Errors have been sought and corrected,but may not always be located. Such creation errors do not reflect on the standard of care.   Author: Arnetha Courser, MD 09/13/2023 1:53 PM  For on call review [email or web address].

## 2023-09-14 DIAGNOSIS — E860 Dehydration: Secondary | ICD-10-CM | POA: Diagnosis not present

## 2023-09-14 DIAGNOSIS — N179 Acute kidney failure, unspecified: Secondary | ICD-10-CM | POA: Diagnosis not present

## 2023-09-14 DIAGNOSIS — R531 Weakness: Principal | ICD-10-CM

## 2023-09-14 LAB — PROTIME-INR
INR: 2.6 — ABNORMAL HIGH (ref 0.8–1.2)
Prothrombin Time: 27.8 s — ABNORMAL HIGH (ref 11.4–15.2)

## 2023-09-14 LAB — RENAL FUNCTION PANEL
Albumin: 3.1 g/dL — ABNORMAL LOW (ref 3.5–5.0)
Anion gap: 9 (ref 5–15)
BUN: 47 mg/dL — ABNORMAL HIGH (ref 8–23)
CO2: 24 mmol/L (ref 22–32)
Calcium: 9.1 mg/dL (ref 8.9–10.3)
Chloride: 108 mmol/L (ref 98–111)
Creatinine, Ser: 1.41 mg/dL — ABNORMAL HIGH (ref 0.61–1.24)
GFR, Estimated: 55 mL/min — ABNORMAL LOW (ref >60)
Glucose, Bld: 157 mg/dL — ABNORMAL HIGH (ref 70–99)
Phosphorus: 2.2 mg/dL — ABNORMAL LOW (ref 2.5–4.6)
Potassium: 3.8 mmol/L (ref 3.5–5.1)
Sodium: 141 mmol/L (ref 135–145)

## 2023-09-14 LAB — GLUCOSE, CAPILLARY: Glucose-Capillary: 236 mg/dL — ABNORMAL HIGH (ref 70–99)

## 2023-09-14 MED ORDER — LISINOPRIL 20 MG PO TABS
20.0000 mg | ORAL_TABLET | Freq: Every morning | ORAL | Status: DC
  Administered 2023-09-14: 20 mg via ORAL
  Filled 2023-09-14: qty 1

## 2023-09-14 MED ORDER — CARVEDILOL 6.25 MG PO TABS
6.2500 mg | ORAL_TABLET | Freq: Two times a day (BID) | ORAL | Status: DC
  Administered 2023-09-14: 6.25 mg via ORAL
  Filled 2023-09-14: qty 1

## 2023-09-14 MED ORDER — MONTELUKAST SODIUM 10 MG PO TABS: 10.0000 mg | ORAL_TABLET | Freq: Every day | ORAL | Status: DC | PRN

## 2023-09-14 MED ORDER — LISINOPRIL 20 MG PO TABS: 20.0000 mg | ORAL_TABLET | Freq: Every morning | ORAL | 1 refills | Status: DC

## 2023-09-14 NOTE — TOC CM/SW Note (Signed)
 Transition of Care [facility]) - Inpatient Brief Assessment   Patient Details  Name: Rodney Oneal MRN: [id number] Date of Birth: 1956/05/28  Transition of Care [facility] CM/SW Contact:    Odilia Bennett, LCSW Phone Number: 09/14/2023, 9:44 AM   Clinical Narrative: CSW reviewed chart. No TOC needs identified so far. CSW will continue to follow progress. Please place [facility] consult if any needs arise.  Transition of Care Asessment: Insurance and Status: Insurance coverage has been reviewed Patient has primary care physician: Yes Home environment has been reviewed: Single family home Prior level of function:: Not documented Prior/Current Home Services: No current home services Social Drivers of Health Review: SDOH reviewed no interventions necessary Readmission risk has been reviewed: Yes Transition of care needs: no transition of care needs at this time

## 2023-09-14 NOTE — Discharge Summary (Signed)
 Physician Discharge Summary   Patient: Rodney Oneal MRN: [id number] DOB: 1956/02/19  Admit date:     09/11/2023  Discharge date: 09/14/23  Discharge Physician: Arnetha Courser   PCP: Myrene Buddy, NP   Recommendations at discharge:  Please check INR on Monday Please obtain CBC and BMP on follow-up His AKI was thought to be due to overdiuresis-please adjust diuretic dose.  We discontinued hydrochlorothiazide Follow-up with primary care provider Follow-up with cardiology  Discharge Diagnoses: Principal Problem:   AKI (acute kidney injury) (HCC) Active Problems:   Hyperkalemia   Essential hypertension   Insulin dependent type 2 diabetes mellitus (HCC)   A-fib (HCC)   Hyperlipidemia   Abdominal obesity and metabolic syndrome   Obesity, Class III, BMI 40-49.9 (morbid obesity) (HCC)   OSA (obstructive sleep apnea)   Hypertriglyceridemia   Weakness   Dehydration   Hyperphosphatemia   Hospital Course: Rodney Oneal is a 68 year old male with history of hyperlipidemia, hypertension, depression, anxiety, atrial fibrillation status post ablation, on warfarin therapy, insulin-dependent diabetes mellitus, who presents emergency department for chief concerns of weakness.  Hemodynamically stable on presentation.  Labs with Serum sodium is 138, potassium 5.2, chloride 102, bicarb 21, BUN of 114, serum creatinine of 4.53, EGFR 13, nonfasting blood glucose 174, WBC 8.8, hemoglobin 12, platelets of 187. High sensitive troponin is 27.  Patient recently increased his home dose of torsemide. Admitted for AKI and hyperkalemia.  4/16: Blood pressure started trending up, at 150/69.  Starting on amlodipine. Slowly improving creatinine now at 2.05.  Hyperkalemia has resolved.  INR at 3.5.   4/17: Remained hemodynamically stable, mildly elevated blood pressure.  Renal function continued to improve with creatinine at 1.41, home lisinopril was resumed.  INR at 2.6.  Patient was instructed to  resume home medications including Coumadin and have a close follow-up at Coumadin clinic for INR check.  Patient will resume torsemide in 1-2 doses at his prior dose and need to have a close follow-up with his cardiologist for further assistance.  Patient clinically appears euvolemic.  Counseling was provided for weight loss  He will continue his home medications and follow-up with his providers closely for further assistance.  Assessment and Plan: * AKI (acute kidney injury) (HCC) Suspect secondary to torsemide use-dose was increased recently Improving renal function, creatinine today at 1.41, baseline around 1.2. Patient can resume prior dose of torsemide in 1 to 2 days  Hyperkalemia Secondary to acute kidney injury,  Resolved.  Essential hypertension Blood pressure started trending up. Holding home lisinopril, HCTZ, Coreg and torsemide. - Start him on amlodipine - Monitor blood pressure   A-fib (HCC) Patient with history of paroxysmal atrial fibrillation. -Continue home amiodarone -Coumadin per pharmacy-INR now at 2.6.  It was supratherapeutic on admission and Coumadin was held.  Patient will resume her home dose of Coumadin and have her levels checked on Monday.  Insulin dependent type 2 diabetes mellitus (HCC) Takes Evaristo Bury and metformin at home.  A1c of 6.1 - Continue with SSI  Hyperlipidemia Patient apparently was not taking his home atorvastatin -Need to discuss with PCP  Abdominal obesity and metabolic syndrome Extensive discussion with patient at bedside for safe and gradual weight loss in conjunction with working with PCP Patient endorses understanding and compliance  Obesity, Class III, BMI 40-49.9 (morbid obesity) (HCC) This complicates overall care and prognosis.   OSA (obstructive sleep apnea) CPAP nightly ordered  Consultants: None Procedures performed: None Disposition: Home Diet recommendation:  Discharge Diet Orders (From  admission, onward)      Start     Ordered   09/14/23 0000  Diet - low sodium heart healthy        09/14/23 1046           Cardiac and Carb modified diet DISCHARGE MEDICATION: Allergies as of 09/14/2023       Reactions   Sulfa Antibiotics Anaphylaxis, Itching, Rash        Medication List     STOP taking these medications    lisinopril-hydrochlorothiazide 20-25 MG tablet Commonly known as: ZESTORETIC       TAKE these medications    amiodarone 200 MG tablet Commonly known as: PACERONE Take 200 mg by mouth in the morning.   ascorbic acid 500 MG tablet Commonly known as: VITAMIN C Take 500 mg by mouth in the morning.   atorvastatin 80 MG tablet Commonly known as: LIPITOR Take 80 mg by mouth at bedtime.   carvedilol 6.25 MG tablet Commonly known as: COREG Take 6.25 mg by mouth 2 (two) times daily with a meal.   cyanocobalamin 1000 MCG tablet Commonly known as: VITAMIN B12 Take 1,000 mcg by mouth in the morning.   diltiazem 240 MG 24 hr capsule Commonly known as: CARDIZEM CD Take 240 mg by mouth in the morning.   FLUoxetine 40 MG capsule Commonly known as: PROZAC Take 40 mg by mouth in the morning.   fluticasone 50 MCG/ACT nasal spray Commonly known as: FLONASE Place 2 sprays into both nostrils daily as needed for allergies.   insulin degludec 200 UNIT/ML FlexTouch Pen Commonly known as: TRESIBA Inject 30 Units into the skin daily.   insulin lispro 100 UNIT/ML injection Commonly known as: HUMALOG Inject 15 Units into the skin See admin instructions. Inject 15 units subcutaneously in the morning and inject 15 units subcutaneously in the evening.   lisinopril 20 MG tablet Commonly known as: ZESTRIL Take 1 tablet (20 mg total) by mouth in the morning. Start taking on: September 15, 2023 What changed:  medication strength how much to take   Magnesium Oxide 250 MG Tabs Take 250 mg by mouth in the morning.   metFORMIN 1000 MG tablet Commonly known as: GLUCOPHAGE Take 1,000  mg by mouth 2 (two) times daily with a meal.   montelukast 10 MG tablet Commonly known as: SINGULAIR Take 1 tablet (10 mg total) by mouth daily as needed (allergies/respiratory issues.).   tamsulosin 0.4 MG Caps capsule Commonly known as: FLOMAX Take 0.4 mg by mouth at bedtime.   Vitamin D3 50 MCG (2000 UT) capsule Take 2,000 Units by mouth in the morning.   warfarin 3 MG tablet Commonly known as: COUMADIN Take 3 mg by mouth every morning.   zinc gluconate 50 MG tablet Take 50 mg by mouth in the morning.        Follow-up Information     Gauger, Asenath Blacker, NP. Schedule an appointment as soon as possible for a visit in 1 week(s).   Specialty: Internal Medicine Contact information: [address] [place] [address] [phone]                Discharge Exam: Cleavon Curls Weights   09/11/23 1237  Weight: (!) 141.1 kg   General.  Morbidly obese gentleman, in no acute distress. Pulmonary.  Lungs clear bilaterally, normal respiratory effort. CV.  Regular rate and rhythm, no JVD, rub or murmur. Abdomen.  Soft, nontender, nondistended, BS positive. CNS.  Alert and oriented .  No focal  neurologic deficit. Extremities.  No edema, no cyanosis, pulses intact and symmetrical. Psychiatry.  Judgment and insight appears normal.   Condition at discharge: stable  The results of significant diagnostics from this hospitalization (including imaging, microbiology, ancillary and laboratory) are listed below for reference.   Imaging Studies: DG Chest 2 View Result Date: 09/11/2023 CLINICAL DATA:  Weakness for 2 days EXAM: CHEST - 2 VIEW COMPARISON:  CT from 09/05/2023 FINDINGS: Cardiac shadow is within normal limits. No focal infiltrate or sizable effusion is seen. No bony abnormality is noted. No acute bony abnormality is noted. IMPRESSION: No acute abnormality noted. Electronically Signed   By: Violeta Grey M.D.   On: 09/11/2023 19:14    Microbiology: Results for orders placed  or performed during the hospital encounter of 09/11/23  Resp panel by RT-PCR (RSV, Flu A&B, Covid) Anterior Nasal Swab     Status: None   Collection Time: 09/11/23  4:11 PM   Specimen: Anterior Nasal Swab  Result Value Ref Range Status   SARS Coronavirus 2 by RT PCR NEGATIVE NEGATIVE Final    Comment: (NOTE) SARS-CoV-2 target nucleic acids are NOT DETECTED.  The SARS-CoV-2 RNA is generally detectable in upper respiratory specimens during the acute phase of infection. The lowest concentration of SARS-CoV-2 viral copies this assay can detect is 138 copies/mL. A negative result does not preclude SARS-Cov-2 infection and should not be used as the sole basis for treatment or other patient management decisions. A negative result may occur with  improper specimen collection/handling, submission of specimen other than nasopharyngeal swab, presence of viral mutation(s) within the areas targeted by this assay, and inadequate number of viral copies(<138 copies/mL). A negative result must be combined with clinical observations, patient history, and epidemiological information. The expected result is Negative.  Fact Sheet for Patients:  BloggerCourse.com  Fact Sheet for Healthcare Providers:  SeriousBroker.it  This test is no t yet approved or cleared by the [place]  FDA and  has been authorized for detection and/or diagnosis of SARS-CoV-2 by FDA under an Emergency Use Authorization (EUA). This EUA will remain  in effect (meaning this test can be used) for the duration of the COVID-19 declaration under Section 564(b)(1) of the Act, 21 U.S.C.section 360bbb-3(b)(1), unless the authorization is terminated  or revoked sooner.       Influenza A by PCR NEGATIVE NEGATIVE Final   Influenza B by PCR NEGATIVE NEGATIVE Final    Comment: (NOTE) The Xpert Xpress SARS-CoV-2/FLU/RSV plus assay is intended as an aid in the diagnosis of influenza from  Nasopharyngeal swab specimens and should not be used as a sole basis for treatment. Nasal washings and aspirates are unacceptable for Xpert Xpress SARS-CoV-2/FLU/RSV testing.  Fact Sheet for Patients: BloggerCourse.com  Fact Sheet for Healthcare Providers: SeriousBroker.it  This test is not yet approved or cleared by the [place]  FDA and has been authorized for detection and/or diagnosis of SARS-CoV-2 by FDA under an Emergency Use Authorization (EUA). This EUA will remain in effect (meaning this test can be used) for the duration of the COVID-19 declaration under Section 564(b)(1) of the Act, 21 U.S.C. section 360bbb-3(b)(1), unless the authorization is terminated or revoked.     Resp Syncytial Virus by PCR NEGATIVE NEGATIVE Final    Comment: (NOTE) Fact Sheet for Patients: BloggerCourse.com  Fact Sheet for Healthcare Providers: SeriousBroker.it  This test is not yet approved or cleared by the [place]  FDA and has been authorized for detection and/or diagnosis of SARS-CoV-2 by FDA under an  Emergency Use Authorization (EUA). This EUA will remain in effect (meaning this test can be used) for the duration of the COVID-19 declaration under Section 564(b)(1) of the Act, 21 U.S.C. section 360bbb-3(b)(1), unless the authorization is terminated or revoked.  Performed at [facility], [address] [place], [place] [address]     Labs: CBC: Recent Labs  Lab 09/11/23 1241 09/12/23 0514  WBC 8.8 6.9  HGB 12.0* 11.3*  HCT 40.0 35.2*  MCV 99.8 94.4  PLT 187 165   Basic Metabolic Panel: Recent Labs  Lab 09/11/23 1241 09/12/23 0514 09/13/23 0549 09/14/23 0457  NA 138 137 141 141  K 5.2* 3.8 3.8 3.8  CL 102 104 109 108  CO2 21* 23 24 24   GLUCOSE 174* 157* 174* 157*  BUN 114* 115* 78* 47*  CREATININE 4.53* 3.45* 2.05* 1.41*  CALCIUM 8.9 8.6* 8.9 9.1   MG 2.3  --   --   --   PHOS 5.9*  --   --  2.2*   Liver Function Tests: Recent Labs  Lab 09/14/23 0457  ALBUMIN 3.1*   CBG: Recent Labs  Lab 09/13/23 0809 09/13/23 1204 09/13/23 1657 09/13/23 2141 09/14/23 1000  GLUCAP 187* 140* 108* 173* 236*    Discharge time spent: greater than 30 minutes.  This record has been created using Conservation officer, historic buildings. Errors have been sought and corrected,but may not always be located. Such creation errors do not reflect on the standard of care.   Signed: Luna Salinas, MD [facility] 09/14/2023

## 2023-10-04 ENCOUNTER — Telehealth: Payer: Self-pay | Admitting: Acute Care

## 2023-10-04 NOTE — Telephone Encounter (Signed)
 Returned call from VM from [place] requesting a call back for [employer] (not on dpr).  The patient Rodney Oneal answered and I explained I was returning a call from Leary Provencal and was not sure if he was inquiring about results of recent LDCT or the purpose of the call. The patient did not seem to know and said 'hold on.'  I could hear a woman talking on another phone, it sounded like.  No one returned to the phone and after saying hello  for a several minutes, I disconnected the call.  Will try the call again.

## 2023-10-04 NOTE — Telephone Encounter (Signed)
 Wife, Leary Provencal, called back and spoke with Pearl Bottcher, RN.  Patient is wanting results of LDCT, which has not been read.  Tyra Galley explained we will request the reading from radiology and notify the patient when we have reviewed results.

## 2023-10-04 NOTE — Telephone Encounter (Signed)
 I have called the patient and his wife with the results of his LDCT. The scan was read as a LR 1. Annual follow up is due 08/2024.   Pt. Scan had incidental findings of heart failure , a small pericardial effusion that is stable, continued Subpleural reticulation in a craniocaudal gradient, indicative of interstitial lung disease such as usual interstitial pneumonitis. At patient's last scan 04/2023, this was noted, and we asked the patient if he would like referral to pulmonary here to have this further evaluated. He declined, and wanted to re-evaluate at his 3 month follow up scan that was due 08/2023.   Pt had an ablation for a fib in March 2025. He has had worsening heart failure since. He had oxygen saturation drops into the 70's and has been started on oxygen and referred to [facility] for management. I read the note dated 10/03/2023. CXR showed Recurrent  pulmonary edema and persistent changes of   pulmonary fibrosis , both of which could be causing hypoxemia. He also has an  enlarged pulmonic trunk, indicative of pulmonary arterial hypertension.He has had a recent echo. I explained to his wife that we are managing his lung cancer screening,but that it makes sense for them to have [facility] manage his other issues, as they are local to where they live. I told her Ivette Marks should be able to see the written CT Scan  report in EPIC, and they can request the images on CD so they can look at his scans.She stated she will let them know. Both patient and his spouse verbalized understanding of the above and had no further questions at completion of the call.   Gentry Kief, Tyra Galley and Guin. Please place order for 12 month follow up due 08/2024. Please let PCP know plan for follow up. Thanks so muchj

## 2023-10-05 ENCOUNTER — Other Ambulatory Visit: Payer: Self-pay

## 2023-10-05 DIAGNOSIS — Z122 Encounter for screening for malignant neoplasm of respiratory organs: Secondary | ICD-10-CM

## 2023-10-05 DIAGNOSIS — Z87891 Personal history of nicotine dependence: Secondary | ICD-10-CM

## 2023-10-05 NOTE — Telephone Encounter (Signed)
 Results and plan faxed to PCP. Annual CT order placed.

## 2023-10-20 ENCOUNTER — Ambulatory Visit: Attending: Internal Medicine

## 2023-10-20 DIAGNOSIS — G4736 Sleep related hypoventilation in conditions classified elsewhere: Secondary | ICD-10-CM | POA: Insufficient documentation

## 2023-10-20 DIAGNOSIS — G4719 Other hypersomnia: Secondary | ICD-10-CM | POA: Insufficient documentation

## 2023-11-23 ENCOUNTER — Other Ambulatory Visit (HOSPITAL_COMMUNITY): Payer: Self-pay | Admitting: Nephrology

## 2023-11-23 DIAGNOSIS — N1831 Chronic kidney disease, stage 3a: Secondary | ICD-10-CM

## 2023-11-23 DIAGNOSIS — N179 Acute kidney failure, unspecified: Secondary | ICD-10-CM

## 2023-11-23 DIAGNOSIS — E1122 Type 2 diabetes mellitus with diabetic chronic kidney disease: Secondary | ICD-10-CM

## 2023-11-29 ENCOUNTER — Ambulatory Visit
Admission: RE | Admit: 2023-11-29 | Discharge: 2023-11-29 | Disposition: A | Discharge status: Home / Self Care | Source: Ambulatory Visit | Attending: Nephrology | Admitting: Nephrology

## 2023-11-29 DIAGNOSIS — N1831 Chronic kidney disease, stage 3a: Secondary | ICD-10-CM | POA: Diagnosis present

## 2023-11-29 DIAGNOSIS — N183 Chronic kidney disease, stage 3 unspecified: Secondary | ICD-10-CM | POA: Diagnosis present

## 2023-11-29 DIAGNOSIS — E1122 Type 2 diabetes mellitus with diabetic chronic kidney disease: Secondary | ICD-10-CM | POA: Diagnosis present

## 2023-11-29 DIAGNOSIS — N179 Acute kidney failure, unspecified: Secondary | ICD-10-CM | POA: Insufficient documentation

## 2023-11-30 ENCOUNTER — Encounter (HOSPITAL_COMMUNITY): Payer: Self-pay

## 2023-11-30 ENCOUNTER — Other Ambulatory Visit (HOSPITAL_COMMUNITY)

## 2023-12-21 ENCOUNTER — Ambulatory Visit: Admission: EM | Admit: 2023-12-21 | Discharge: 2023-12-21

## 2023-12-21 ENCOUNTER — Ambulatory Visit

## 2023-12-21 DIAGNOSIS — W19XXXA Unspecified fall, initial encounter: Secondary | ICD-10-CM

## 2023-12-21 DIAGNOSIS — S0990XA Unspecified injury of head, initial encounter: Secondary | ICD-10-CM

## 2023-12-21 DIAGNOSIS — R600 Localized edema: Secondary | ICD-10-CM

## 2023-12-21 NOTE — ED Triage Notes (Addendum)
 Patient states that he fell yesterday around 10. Injured right foot. Right foot is swollen and bruised.   Patient then stated that he hit his head, didn't black out. Hit right side of ribs. Patient states that he just has a knot on his head and doesn't need imaging for that. Patient states that it does hurt to breathe in on the right side. right side is sore.

## 2023-12-21 NOTE — ED Notes (Signed)
 Patient is being discharged from the Urgent Care and sent to the Emergency Department via private vehicle . Per NP Defelice, patient is in need of higher level of care due to head injury. Patient is aware and verbalizes understanding of plan of care.  Vitals:   12/21/23 1843  BP: (!) 161/67  Pulse: (!) 59  Resp: 19  Temp: 97.9 F (36.6 C)  SpO2: 94%

## 2023-12-21 NOTE — Discharge Instructions (Signed)
 Go to the emergency room for further evaluation of head injury on Eliquis, bilateral leg edema

## 2023-12-21 NOTE — ED Notes (Signed)
 Patient is being discharged from the Urgent Care and sent to the Emergency Department via POV . Per    Defelice, Rilla, NP   , patient is in need of higher level of care due to head injury, patient on elquis. . Patient is aware and verbalizes understanding of plan of care.  Vitals:   12/21/23 1843  BP: (!) 161/67  Pulse: (!) 59  Resp: 19  Temp: 97.9 F (36.6 C)  SpO2: 94%

## 2023-12-21 NOTE — ED Provider Notes (Signed)
 [facility]    CSN: [id number] Arrival date & time: 12/21/23  1759      History   Chief Complaint Chief Complaint  Patient presents with   Fall    HPI Rodney Oneal is a 68 y.o. male.   68 year old male patient, Rodney Oneal, presents to urgent care for evaluation of unwitnessed fall that occurred yesterday, patient complaining of right foot pain, right sided rib pain, right side pain,and headache.  Patient/significant other state pt has not been taking his diuretic daily as it bottoms him out.   Patient denies any LOC but does have a headache he is on Eliquis, rates pain as 10 out of 10  PMH: Afib, Diabetes,HTN, CVA, on home O2  The history is provided by the patient. No language interpreter was used.    Past Medical History:  Diagnosis Date   A-fib [facility])    Arthritis    BPH (benign prostatic hyperplasia)    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    Depression    Diabetes mellitus without complication [facility])    ED (erectile dysfunction)    GERD (gastroesophageal reflux disease)    History of kidney stones    Hyperlipidemia    Hypertension    Obesity    Sleep apnea    Stroke [facility])     Patient Active Problem List   Diagnosis Date Noted   Fall 12/21/2023   Head injury 12/21/2023   Bilateral leg edema 12/21/2023   Weakness 09/14/2023   Dehydration 09/14/2023   Hyperphosphatemia 09/14/2023   A-fib (HCC)    AKI (acute kidney injury) (HCC) 09/11/2023   Hyperkalemia 09/11/2023   Insulin  dependent type 2 diabetes mellitus (HCC) 09/11/2023   Essential hypertension 09/11/2023   Hyperlipidemia 09/11/2023   OSA (obstructive sleep apnea) 09/11/2023   Hypertriglyceridemia 09/11/2023   Abdominal obesity and metabolic syndrome 09/11/2023   Obesity, Class III, BMI 40-49.9 (morbid obesity) 09/11/2023    Past Surgical History:  Procedure Laterality Date   CARDIOVERSION N/A 03/27/2023   Procedure: CARDIOVERSION;  Surgeon: Florencio Cara BIRCH,  MD;  Location: [facility] ORS;  Service: Cardiovascular;  Laterality: N/A;   CARDIOVERSION N/A 04/19/2023   Procedure: CARDIOVERSION;  Surgeon: Florencio Cara BIRCH, MD;  Location: [facility] ORS;  Service: Cardiovascular;  Laterality: N/A;   CARDIOVERSION N/A 05/10/2023   Procedure: CARDIOVERSION;  Surgeon: Dewane Shiner, DO;  Location: [facility] ORS;  Service: Cardiovascular;  Laterality: N/A;   COLONOSCOPY WITH PROPOFOL      COLONOSCOPY WITH PROPOFOL  N/A 02/28/2019   Procedure: COLONOSCOPY WITH PROPOFOL ;  Surgeon: Gaylyn Gladis PENNER, MD;  Location: [facility] ENDOSCOPY;  Service: Endoscopy;  Laterality: N/A;   kidney stones removed     left rotator cuff repair     right shoulder surgery         Home Medications    Prior to Admission medications   Medication Sig Start Date End Date Taking? Authorizing Provider  amiodarone  (PACERONE ) 200 MG tablet Take 200 mg by mouth in the morning.   Yes [provider]  apixaban (ELIQUIS) 5 MG TABS tablet Take 5 mg by mouth. 10/31/23  Yes [provider]  atorvastatin (LIPITOR) 80 MG tablet Take 80 mg by mouth at bedtime.   Yes [provider]  carvedilol  (COREG ) 6.25 MG tablet Take 6.25 mg by mouth 2 (two) times daily with a meal.   Yes [provider]  diltiazem (CARDIZEM CD) 240 MG 24 hr capsule Take 240 mg by mouth in the morning.  Yes [provider]  empagliflozin (JARDIANCE) 10 MG TABS tablet Take 10 mg by mouth. 10/02/23  Yes [provider]  FLUoxetine  (PROZAC ) 40 MG capsule Take 40 mg by mouth in the morning.   Yes [provider]  insulin  degludec (TRESIBA) 200 UNIT/ML FlexTouch Pen Inject 30 Units into the skin daily.   Yes [provider]  insulin  lispro (HUMALOG) 100 UNIT/ML injection Inject 15 Units into the skin See admin instructions. Inject 15 units subcutaneously in the morning and inject 15 units subcutaneously in the evening.   Yes [provider]  lisinopril  (ZESTRIL ) 20 MG  tablet Take 1 tablet (20 mg total) by mouth in the morning. 09/15/23  Yes Caleen Qualia, MD  tamsulosin  (FLOMAX ) 0.4 MG CAPS capsule Take 0.4 mg by mouth at bedtime.   Yes [provider]  torsemide (DEMADEX) 20 MG tablet Take 20 mg by mouth. 10/18/23  Yes [provider]  ascorbic acid  (VITAMIN C ) 500 MG tablet Take 500 mg by mouth in the morning.    [provider]  Cholecalciferol  (VITAMIN D3) 50 MCG (2000 UT) capsule Take 2,000 Units by mouth in the morning.    [provider]  cyanocobalamin  (VITAMIN B12) 1000 MCG tablet Take 1,000 mcg by mouth in the morning.    [provider]  fluticasone  (FLONASE ) 50 MCG/ACT nasal spray Place 2 sprays into both nostrils daily as needed for allergies.    [provider]  Magnesium Oxide 250 MG TABS Take 250 mg by mouth in the morning.    [provider]  metFORMIN (GLUCOPHAGE) 1000 MG tablet Take 1,000 mg by mouth 2 (two) times daily with a meal.    [provider]  montelukast  (SINGULAIR ) 10 MG tablet Take 1 tablet (10 mg total) by mouth daily as needed (allergies/respiratory issues.). 09/14/23   Amin, Sumayya, MD  warfarin (COUMADIN) 3 MG tablet Take 3 mg by mouth every morning.    [provider]  zinc gluconate 50 MG tablet Take 50 mg by mouth in the morning.    [provider]    Family History Family History  Problem Relation Age of Onset   Diabetes Mother    Heart failure Mother    Heart attack Mother    Heart failure Father    Heart attack Brother    Heart failure Brother     Social History Social History   Tobacco Use   Smoking status: Former    Current packs/day: 0.00    Average packs/day: 1 pack/day for 37.7 years (37.7 ttl pk-yrs)    Types: Cigarettes    Start date: 59    Quit date: 02/27/2013    Years since quitting: 10.8   Smokeless tobacco: Never  Vaping Use   Vaping status: Never Used  Substance Use Topics   Alcohol use: Not  Currently   Drug use: Not Currently     Allergies   Sulfa antibiotics   Review of Systems Review of Systems  Respiratory:         Right-sided chest wall pain  Cardiovascular:  Positive for leg swelling.  Musculoskeletal:  Positive for arthralgias, gait problem and myalgias.  Skin:  Positive for color change and wound.  Neurological:  Positive for headaches.  All other systems reviewed and are negative.    Physical Exam Triage Vital Signs ED Triage Vitals  Encounter Vitals Group     BP 12/21/23 1843 (!) 161/67     Girls Systolic BP Percentile --  Girls Diastolic BP Percentile --      Boys Systolic BP Percentile --      Boys Diastolic BP Percentile --      Pulse Rate 12/21/23 1843 (!) 59     Resp 12/21/23 1843 19     Temp 12/21/23 1843 97.9 F (36.6 C)     Temp Source 12/21/23 1843 Temporal     SpO2 12/21/23 1843 94 %     Weight --      Height --      Head Circumference --      Peak Flow --      Pain Score 12/21/23 1840 10     Pain Loc --      Pain Education --      Exclude from Growth Chart --    No data found.  Updated Vital Signs BP (!) 161/67 (BP Location: Right Arm)   Pulse (!) 59   Temp 97.9 F (36.6 C) (Temporal)   Resp 19   SpO2 94%   Visual Acuity Right Eye Distance:   Left Eye Distance:   Bilateral Distance:    Right Eye Near:   Left Eye Near:    Bilateral Near:     Physical Exam Vitals and nursing note reviewed.  Constitutional:      Appearance: He is well-developed and well-groomed.  HENT:     Head:      Right Ear: Tympanic membrane normal.     Left Ear: Tympanic membrane normal.  Cardiovascular:     Rate and Rhythm: Regular rhythm. Bradycardia present.     Pulses:          Posterior tibial pulses are 2+ on the right side.     Heart sounds: Normal heart sounds.  Pulmonary:     Effort: Pulmonary effort is normal.  Musculoskeletal:       Feet:  Feet:     Right foot:     Skin integrity: Skin breakdown present.      Comments: Bilateral lower extremity edema Skin:    Findings: Ecchymosis, signs of injury and wound present.      Neurological:     Mental Status: He is alert and oriented to person, place, and time.     GCS: GCS eye subscore is 4. GCS verbal subscore is 5. GCS motor subscore is 6.  Psychiatric:        Attention and Perception: Attention normal.        Mood and Affect: Mood normal.        Speech: Speech normal.        Behavior: Behavior is cooperative.      UC Treatments / Results  Labs (all labs ordered are listed, but only abnormal results are displayed) Labs Reviewed - No data to display  EKG   Radiology No results found.  Procedures Procedures (including critical care time)  Medications Ordered in UC Medications - No data to display  Initial Impression / Assessment and Plan / UC Course  I have reviewed the triage vital signs and the nursing notes.  Pertinent labs & imaging results that were available during my care of the patient were reviewed by me and considered in my medical decision making (see chart for details).  Clinical Course as of 12/21/23 2115  Thu Dec 21, 2023  1902 Offered x-rays, unable to perform head CT, patient and significant other wish to go to [facility] ER for further evaluation, will go by POV. [JD]    Clinical  Course User Index [JD] Zedekiah Hinderman, Rilla, NP   Discussed exam findings and plan of care with patient and significant other, given patient is on Eliquis with head injury that was unwitnessed recommend further evaluation in ER, patient will also need additional imaging and lab work as patient has not been taking his diuretic and has bilateral leg edema.  Both patient and significant other verbalized understanding this provider  Ddx: Fall, head injury, bilateral leg edema Final Clinical Impressions(s) / UC Diagnoses   Final diagnoses:  Fall, initial encounter  Injury of head, initial encounter  Bilateral leg edema     Discharge  Instructions      Go to the emergency room for further evaluation of head injury on Eliquis, bilateral leg edema   ED Prescriptions   None    PDMP not reviewed this encounter.   Aminta Rilla, NP 12/21/23 2118

## 2024-01-17 ENCOUNTER — Other Ambulatory Visit: Payer: Self-pay | Admitting: Emergency Medicine

## 2024-01-17 DIAGNOSIS — J9611 Chronic respiratory failure with hypoxia: Secondary | ICD-10-CM

## 2024-01-17 DIAGNOSIS — G4733 Obstructive sleep apnea (adult) (pediatric): Secondary | ICD-10-CM

## 2024-01-17 DIAGNOSIS — J432 Centrilobular emphysema: Secondary | ICD-10-CM

## 2024-01-17 DIAGNOSIS — E8779 Other fluid overload: Secondary | ICD-10-CM

## 2024-01-17 DIAGNOSIS — J449 Chronic obstructive pulmonary disease, unspecified: Secondary | ICD-10-CM

## 2024-01-17 DIAGNOSIS — J984 Other disorders of lung: Secondary | ICD-10-CM

## 2024-01-22 ENCOUNTER — Ambulatory Visit
Admission: RE | Admit: 2024-01-22 | Discharge: 2024-01-22 | Disposition: A | Discharge status: Home / Self Care | Source: Ambulatory Visit | Attending: Emergency Medicine | Admitting: Emergency Medicine

## 2024-01-22 DIAGNOSIS — J432 Centrilobular emphysema: Secondary | ICD-10-CM | POA: Insufficient documentation

## 2024-01-22 DIAGNOSIS — E8779 Other fluid overload: Secondary | ICD-10-CM | POA: Diagnosis present

## 2024-01-22 DIAGNOSIS — J9611 Chronic respiratory failure with hypoxia: Secondary | ICD-10-CM | POA: Insufficient documentation

## 2024-01-22 DIAGNOSIS — J449 Chronic obstructive pulmonary disease, unspecified: Secondary | ICD-10-CM | POA: Diagnosis present

## 2024-01-22 DIAGNOSIS — G4733 Obstructive sleep apnea (adult) (pediatric): Secondary | ICD-10-CM | POA: Diagnosis present

## 2024-01-22 DIAGNOSIS — J984 Other disorders of lung: Secondary | ICD-10-CM | POA: Diagnosis present

## 2024-01-22 MED ORDER — IOHEXOL 300 MG/ML  SOLN
75.0000 mL | Freq: Once | INTRAMUSCULAR | Status: AC | PRN
  Administered 2024-01-22: 75 mL via INTRAVENOUS

## 2024-02-06 ENCOUNTER — Emergency Department

## 2024-02-06 ENCOUNTER — Inpatient Hospital Stay

## 2024-02-06 ENCOUNTER — Other Ambulatory Visit: Payer: Self-pay

## 2024-02-06 ENCOUNTER — Inpatient Hospital Stay
Admission: EM | Admit: 2024-02-06 | Discharge: 2024-02-09 | DRG: 291 | Disposition: A | Discharge status: Home / Self Care | Attending: Student in an Organized Health Care Education/Training Program | Admitting: Student in an Organized Health Care Education/Training Program

## 2024-02-06 DIAGNOSIS — I5021 Acute systolic (congestive) heart failure: Secondary | ICD-10-CM | POA: Diagnosis not present

## 2024-02-06 DIAGNOSIS — E66813 Obesity, class 3: Secondary | ICD-10-CM | POA: Diagnosis present

## 2024-02-06 DIAGNOSIS — I482 Chronic atrial fibrillation, unspecified: Secondary | ICD-10-CM | POA: Diagnosis present

## 2024-02-06 DIAGNOSIS — Z79899 Other long term (current) drug therapy: Secondary | ICD-10-CM

## 2024-02-06 DIAGNOSIS — E785 Hyperlipidemia, unspecified: Secondary | ICD-10-CM | POA: Diagnosis present

## 2024-02-06 DIAGNOSIS — F32A Depression, unspecified: Secondary | ICD-10-CM | POA: Diagnosis present

## 2024-02-06 DIAGNOSIS — Z8249 Family history of ischemic heart disease and other diseases of the circulatory system: Secondary | ICD-10-CM | POA: Diagnosis not present

## 2024-02-06 DIAGNOSIS — I5033 Acute on chronic diastolic (congestive) heart failure: Secondary | ICD-10-CM | POA: Diagnosis present

## 2024-02-06 DIAGNOSIS — G4733 Obstructive sleep apnea (adult) (pediatric): Secondary | ICD-10-CM | POA: Diagnosis present

## 2024-02-06 DIAGNOSIS — N400 Enlarged prostate without lower urinary tract symptoms: Secondary | ICD-10-CM | POA: Diagnosis present

## 2024-02-06 DIAGNOSIS — Z7951 Long term (current) use of inhaled steroids: Secondary | ICD-10-CM

## 2024-02-06 DIAGNOSIS — E1122 Type 2 diabetes mellitus with diabetic chronic kidney disease: Secondary | ICD-10-CM | POA: Diagnosis present

## 2024-02-06 DIAGNOSIS — R188 Other ascites: Secondary | ICD-10-CM | POA: Diagnosis present

## 2024-02-06 DIAGNOSIS — Z6841 Body Mass Index (BMI) 40.0 and over, adult: Secondary | ICD-10-CM | POA: Diagnosis not present

## 2024-02-06 DIAGNOSIS — Z794 Long term (current) use of insulin: Secondary | ICD-10-CM | POA: Diagnosis not present

## 2024-02-06 DIAGNOSIS — L97919 Non-pressure chronic ulcer of unspecified part of right lower leg with unspecified severity: Secondary | ICD-10-CM | POA: Diagnosis present

## 2024-02-06 DIAGNOSIS — Z87891 Personal history of nicotine dependence: Secondary | ICD-10-CM

## 2024-02-06 DIAGNOSIS — H9192 Unspecified hearing loss, left ear: Secondary | ICD-10-CM | POA: Diagnosis present

## 2024-02-06 DIAGNOSIS — Z7984 Long term (current) use of oral hypoglycemic drugs: Secondary | ICD-10-CM

## 2024-02-06 DIAGNOSIS — I4891 Unspecified atrial fibrillation: Secondary | ICD-10-CM | POA: Diagnosis present

## 2024-02-06 DIAGNOSIS — Z9981 Dependence on supplemental oxygen: Secondary | ICD-10-CM

## 2024-02-06 DIAGNOSIS — I3139 Other pericardial effusion (noninflammatory): Secondary | ICD-10-CM | POA: Diagnosis present

## 2024-02-06 DIAGNOSIS — I130 Hypertensive heart and chronic kidney disease with heart failure and stage 1 through stage 4 chronic kidney disease, or unspecified chronic kidney disease: Principal | ICD-10-CM | POA: Diagnosis present

## 2024-02-06 DIAGNOSIS — J9601 Acute respiratory failure with hypoxia: Secondary | ICD-10-CM | POA: Diagnosis present

## 2024-02-06 DIAGNOSIS — Z833 Family history of diabetes mellitus: Secondary | ICD-10-CM

## 2024-02-06 DIAGNOSIS — I509 Heart failure, unspecified: Secondary | ICD-10-CM | POA: Diagnosis not present

## 2024-02-06 DIAGNOSIS — J4489 Other specified chronic obstructive pulmonary disease: Secondary | ICD-10-CM | POA: Diagnosis present

## 2024-02-06 DIAGNOSIS — N1832 Chronic kidney disease, stage 3b: Secondary | ICD-10-CM | POA: Diagnosis present

## 2024-02-06 DIAGNOSIS — R001 Bradycardia, unspecified: Secondary | ICD-10-CM | POA: Diagnosis not present

## 2024-02-06 DIAGNOSIS — Z7901 Long term (current) use of anticoagulants: Secondary | ICD-10-CM

## 2024-02-06 DIAGNOSIS — Z882 Allergy status to sulfonamides status: Secondary | ICD-10-CM

## 2024-02-06 DIAGNOSIS — E8770 Fluid overload, unspecified: Secondary | ICD-10-CM | POA: Diagnosis not present

## 2024-02-06 DIAGNOSIS — Z8673 Personal history of transient ischemic attack (TIA), and cerebral infarction without residual deficits: Secondary | ICD-10-CM

## 2024-02-06 HISTORY — DX: Unspecified asthma, uncomplicated: J45.909

## 2024-02-06 HISTORY — DX: Heart failure, unspecified: I50.9

## 2024-02-06 HISTORY — DX: Chronic obstructive pulmonary disease, unspecified: J44.9

## 2024-02-06 LAB — CBG MONITORING, ED: Glucose-Capillary: 171 mg/dL — ABNORMAL HIGH (ref 70–99)

## 2024-02-06 LAB — CBC
HCT: 32.3 % — ABNORMAL LOW (ref 39.0–52.0)
Hemoglobin: 9.4 g/dL — ABNORMAL LOW (ref 13.0–17.0)
MCH: 27.6 pg (ref 26.0–34.0)
MCHC: 29.1 g/dL — ABNORMAL LOW (ref 30.0–36.0)
MCV: 94.7 fL (ref 80.0–100.0)
Platelets: 163 K/uL (ref 150–400)
RBC: 3.41 MIL/uL — ABNORMAL LOW (ref 4.22–5.81)
RDW: 19.4 % — ABNORMAL HIGH (ref 11.5–15.5)
WBC: 5.3 K/uL (ref 4.0–10.5)
nRBC: 0 % (ref 0.0–0.2)

## 2024-02-06 LAB — COMPREHENSIVE METABOLIC PANEL WITH GFR
ALT: 13 U/L (ref 0–44)
AST: 16 U/L (ref 15–41)
Albumin: 3.3 g/dL — ABNORMAL LOW (ref 3.5–5.0)
Alkaline Phosphatase: 74 U/L (ref 38–126)
Anion gap: 10 (ref 5–15)
BUN: 40 mg/dL — ABNORMAL HIGH (ref 8–23)
CO2: 26 mmol/L (ref 22–32)
Calcium: 8.9 mg/dL (ref 8.9–10.3)
Chloride: 105 mmol/L (ref 98–111)
Creatinine, Ser: 1.95 mg/dL — ABNORMAL HIGH (ref 0.61–1.24)
GFR, Estimated: 37 mL/min — ABNORMAL LOW (ref >60)
Glucose, Bld: 146 mg/dL — ABNORMAL HIGH (ref 70–99)
Potassium: 3.7 mmol/L (ref 3.5–5.1)
Sodium: 141 mmol/L (ref 135–145)
Total Bilirubin: 1.4 mg/dL — ABNORMAL HIGH (ref 0.0–1.2)
Total Protein: 7.6 g/dL (ref 6.5–8.1)

## 2024-02-06 LAB — HIV ANTIBODY (ROUTINE TESTING W REFLEX): HIV Screen 4th Generation wRfx: NONREACTIVE

## 2024-02-06 LAB — TROPONIN I (HIGH SENSITIVITY)
Troponin I (High Sensitivity): 15 ng/L (ref <18)
Troponin I (High Sensitivity): 15 ng/L (ref <18)

## 2024-02-06 LAB — BRAIN NATRIURETIC PEPTIDE: B Natriuretic Peptide: 538.6 pg/mL — ABNORMAL HIGH (ref 0.0–100.0)

## 2024-02-06 MED ORDER — POTASSIUM CHLORIDE CRYS ER 20 MEQ PO TBCR
40.0000 meq | EXTENDED_RELEASE_TABLET | Freq: Once | ORAL | Status: AC
  Administered 2024-02-06: 40 meq via ORAL
  Filled 2024-02-06: qty 2

## 2024-02-06 MED ORDER — INSULIN ASPART 100 UNIT/ML IJ SOLN: 0.0000 [IU] | Freq: Every day | INTRAMUSCULAR | Status: DC

## 2024-02-06 MED ORDER — LINAGLIPTIN 5 MG PO TABS
5.0000 mg | ORAL_TABLET | Freq: Every day | ORAL | Status: DC
  Administered 2024-02-07 – 2024-02-09 (×3): 5 mg via ORAL
  Filled 2024-02-06 (×3): qty 1

## 2024-02-06 MED ORDER — FLUOXETINE HCL 20 MG PO CAPS
40.0000 mg | ORAL_CAPSULE | Freq: Every morning | ORAL | Status: DC
  Administered 2024-02-07 – 2024-02-09 (×3): 40 mg via ORAL
  Filled 2024-02-06 (×3): qty 2

## 2024-02-06 MED ORDER — INSULIN GLARGINE 100 UNIT/ML ~~LOC~~ SOLN
15.0000 [IU] | Freq: Every day | SUBCUTANEOUS | Status: DC
  Administered 2024-02-07 – 2024-02-09 (×3): 15 [IU] via SUBCUTANEOUS
  Filled 2024-02-06 (×3): qty 0.15

## 2024-02-06 MED ORDER — MONTELUKAST SODIUM 10 MG PO TABS: 10.0000 mg | ORAL_TABLET | Freq: Every day | ORAL | Status: DC | PRN

## 2024-02-06 MED ORDER — ATORVASTATIN CALCIUM 80 MG PO TABS
80.0000 mg | ORAL_TABLET | Freq: Every day | ORAL | Status: DC
  Administered 2024-02-06 – 2024-02-08 (×3): 80 mg via ORAL
  Filled 2024-02-06 (×2): qty 1
  Filled 2024-02-06: qty 4

## 2024-02-06 MED ORDER — FUROSEMIDE 10 MG/ML IJ SOLN
60.0000 mg | Freq: Once | INTRAMUSCULAR | Status: AC
  Administered 2024-02-06: 60 mg via INTRAVENOUS
  Filled 2024-02-06: qty 8

## 2024-02-06 MED ORDER — FLUTICASONE PROPIONATE 50 MCG/ACT NA SUSP: 2.0000 | Freq: Every day | NASAL | Status: DC | PRN

## 2024-02-06 MED ORDER — EMPAGLIFLOZIN 10 MG PO TABS
10.0000 mg | ORAL_TABLET | Freq: Every day | ORAL | Status: DC
Start: 2024-02-07 — End: 2024-02-09
  Administered 2024-02-07 – 2024-02-09 (×3): 10 mg via ORAL
  Filled 2024-02-06 (×3): qty 1

## 2024-02-06 MED ORDER — FUROSEMIDE 10 MG/ML IJ SOLN
60.0000 mg | Freq: Every day | INTRAMUSCULAR | Status: DC
  Administered 2024-02-07: 60 mg via INTRAVENOUS
  Filled 2024-02-06: qty 8

## 2024-02-06 MED ORDER — ONDANSETRON HCL 4 MG/2ML IJ SOLN: 4.0000 mg | Freq: Four times a day (QID) | INTRAMUSCULAR | Status: DC | PRN

## 2024-02-06 MED ORDER — INSULIN DEGLUDEC 200 UNIT/ML ~~LOC~~ SOPN: 15.0000 [IU] | PEN_INJECTOR | SUBCUTANEOUS | Status: DC

## 2024-02-06 MED ORDER — SODIUM CHLORIDE 0.9 % IV SOLN: 250.0000 mL | INTRAVENOUS | Status: AC | PRN

## 2024-02-06 MED ORDER — INSULIN ASPART 100 UNIT/ML IJ SOLN
0.0000 [IU] | Freq: Three times a day (TID) | INTRAMUSCULAR | Status: DC
  Administered 2024-02-07 – 2024-02-08 (×4): 2 [IU] via SUBCUTANEOUS
  Filled 2024-02-06: qty 2
  Filled 2024-02-06 (×3): qty 1

## 2024-02-06 MED ORDER — METOPROLOL TARTRATE 5 MG/5ML IV SOLN: 5.0000 mg | INTRAVENOUS | Status: DC | PRN

## 2024-02-06 MED ORDER — SODIUM CHLORIDE 0.9% FLUSH: 3.0000 mL | INTRAVENOUS | Status: DC | PRN

## 2024-02-06 MED ORDER — APIXABAN 5 MG PO TABS
5.0000 mg | ORAL_TABLET | Freq: Two times a day (BID) | ORAL | Status: DC
Start: 2024-02-06 — End: 2024-02-07
  Administered 2024-02-06: 5 mg via ORAL
  Filled 2024-02-06 (×2): qty 1

## 2024-02-06 MED ORDER — TAMSULOSIN HCL 0.4 MG PO CAPS
0.4000 mg | ORAL_CAPSULE | Freq: Every day | ORAL | Status: DC
  Administered 2024-02-06 – 2024-02-08 (×3): 0.4 mg via ORAL
  Filled 2024-02-06 (×3): qty 1

## 2024-02-06 MED ORDER — SODIUM CHLORIDE 0.9% FLUSH
3.0000 mL | Freq: Two times a day (BID) | INTRAVENOUS | Status: DC
  Administered 2024-02-06 – 2024-02-09 (×6): 3 mL via INTRAVENOUS

## 2024-02-06 MED ORDER — ACETAMINOPHEN 325 MG PO TABS: 650.0000 mg | ORAL_TABLET | ORAL | Status: DC | PRN

## 2024-02-06 MED ORDER — METOLAZONE 5 MG PO TABS
5.0000 mg | ORAL_TABLET | Freq: Once | ORAL | Status: AC
  Administered 2024-02-06: 5 mg via ORAL
  Filled 2024-02-06: qty 1

## 2024-02-06 NOTE — ED Notes (Signed)
 CCMD contacted and pt placed on cardiac monitor.

## 2024-02-06 NOTE — H&P (Signed)
 History and Physical    KEAVON SENSING FMW:[id number] DOB: 04-12-1956 DOA: 02/06/2024  PCP: Don Lauraine Collar, NP (Confirm with patient/family/NH records and if not entered, this has to be entered at [facility] point of entry) Patient coming from: Home  I have personally briefly reviewed patient's old medical records in [facility]  Chief Complaint: Fluid overload  HPI: Rodney Oneal is a 68 y.o. male with medical history significant of chronic HFpEF, PAF on Eliquis , HTN, HLD, IDDM, morbid obesity, OSAS on CPAP, CKD stage IIIb, sent from cardiology for failed outpatient management for diuresis.  Patient has a chronic history of CHF on p.o. torsemide  every other day, he explained that due to his baseline CKD, he is very sensitive to daily diuresis,  more than 3 days of every day torsemide , and my kidneys will shut down as result, he has only been taking torsemide  20 mg every other day.  Last 2 months he gained 20-25 pound.  Significantly has had swelling of bilateral lower extremities and weeping fluid from legs with multiple small openings for which has been following with wound care.  His legs were evaluated this week but was considered to be stable.  He denied any chest pain no cough, no fever or chills.  Today, he went to see cardiology, who sent him to ED for IV diuresis.  ED Course: Afebrile, bradycardia heart rate in the 45-47, afebrile, blood pressure 153/68, chest x-ray showed cardiomegaly, blood work showed creatinine 1.9 compared to baseline 1.4-2.0, potassium 3.7 BUN 40 glucose 146 albumin 3.3.  Patient was given IV Lasix  60 mg x 1 in the ED.  Review of Systems: As per HPI otherwise 14 point review of systems negative.    Past Medical History:  Diagnosis Date   A-fib [facility])    had ablation 07/29/23   Arthritis    Asthma    BPH (benign prostatic hyperplasia)    CHF (congestive heart failure) (HCC)    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    COPD (chronic  obstructive pulmonary disease) (HCC)    Depression    Diabetes mellitus without complication (HCC)    takes insulin . 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    Past Surgical History:  Procedure Laterality Date   CARDIOVERSION N/A 03/27/2023   Procedure: CARDIOVERSION;  Surgeon: Florencio Cara BIRCH, MD;  Location: [facility] ORS;  Service: Cardiovascular;  Laterality: N/A;   CARDIOVERSION N/A 04/19/2023   Procedure: CARDIOVERSION;  Surgeon: Florencio Cara BIRCH, MD;  Location: [facility] ORS;  Service: Cardiovascular;  Laterality: N/A;   CARDIOVERSION N/A 05/10/2023   Procedure: CARDIOVERSION;  Surgeon: Dewane Shiner, DO;  Location: [facility] ORS;  Service: Cardiovascular;  Laterality: N/A;   COLONOSCOPY WITH PROPOFOL      COLONOSCOPY WITH PROPOFOL  N/A 02/28/2019   Procedure: COLONOSCOPY WITH PROPOFOL ;  Surgeon: Gaylyn Gladis PENNER, MD;  Location: [facility] ENDOSCOPY;  Service: Endoscopy;  Laterality: N/A;   kidney stones removed     left rotator cuff repair     right shoulder surgery       reports that he quit smoking about 10 years ago. His smoking use included cigarettes. He started smoking about 48 years ago. He has a 37.7  pack-year smoking history. He has never used smokeless tobacco. He reports that he does not currently use alcohol. He reports that he does not currently use drugs.  Allergies  Allergen Reactions   Sulfa Antibiotics Anaphylaxis, Itching and Rash    Family History  Problem Relation Age of Onset   Diabetes Mother    Heart failure Mother    Heart attack Mother    Heart failure Father    Heart attack Brother    Heart failure Brother      Prior to Admission medications   Medication Sig Start Date End Date Taking? Authorizing Provider  amiodarone  (PACERONE ) 200 MG tablet  Take 200 mg by mouth in the morning.    [provider]  apixaban  (ELIQUIS ) 5 MG TABS tablet Take 5 mg by mouth. 10/31/23   [provider]  ascorbic acid  (VITAMIN C ) 500 MG tablet Take 500 mg by mouth in the morning.    [provider]  atorvastatin  (LIPITOR ) 80 MG tablet Take 80 mg by mouth at bedtime.    [provider]  carvedilol  (COREG ) 6.25 MG tablet Take 6.25 mg by mouth 2 (two) times daily with a meal.    [provider]  Cholecalciferol  (VITAMIN D3) 50 MCG (2000 UT) capsule Take 2,000 Units by mouth in the morning.    [provider]  cyanocobalamin  (VITAMIN B12) 1000 MCG tablet Take 1,000 mcg by mouth in the morning.    [provider]  diltiazem (CARDIZEM CD) 240 MG 24 hr capsule Take 240 mg by mouth in the morning.    [provider]  empagliflozin  (JARDIANCE ) 10 MG TABS tablet Take 10 mg by mouth. 10/02/23   [provider]  FLUoxetine  (PROZAC ) 40 MG capsule Take 40 mg by mouth in the morning.    [provider]  fluticasone  (FLONASE ) 50 MCG/ACT nasal spray Place 2 sprays into both nostrils daily as needed for allergies.    [provider]  insulin  degludec (TRESIBA ) 200 UNIT/ML FlexTouch Pen Inject 30 Units into the skin daily.    [provider]  insulin  lispro (HUMALOG) 100 UNIT/ML injection Inject 15 Units into the skin See admin instructions. Inject 15 units subcutaneously in the morning and inject 15 units subcutaneously in the evening.    [provider]  lisinopril  (ZESTRIL ) 20 MG tablet Take 1 tablet (20 mg total) by mouth in the morning. 09/15/23   Amin, Sumayya, MD  Magnesium Oxide 250 MG TABS Take 250 mg by mouth in the morning.    [provider]  metFORMIN (GLUCOPHAGE) 1000 MG tablet Take 1,000 mg by mouth 2 (two) times daily with a meal.    [provider]  montelukast  (SINGULAIR ) 10 MG tablet Take 1 tablet (10 mg total) by mouth daily as  needed (allergies/respiratory issues.). 09/14/23   Amin, Sumayya, MD  tamsulosin  (FLOMAX ) 0.4 MG CAPS capsule Take 0.4 mg by mouth at bedtime.    [provider]  torsemide  (DEMADEX ) 20 MG tablet Take 20 mg by mouth. 10/18/23   [provider]  warfarin (COUMADIN) 3 MG tablet Take 3 mg by mouth every morning.    [provider]  zinc gluconate 50 MG tablet Take 50 mg by mouth in the morning.    [provider]    Physical Exam: Vitals:   02/06/24 1321 02/06/24 1324 02/06/24 1630  BP: (!) 153/68  (!) 142/64  Pulse: (!) 47  (!) 46  Resp: 20  17  Temp:  98.7 F (37.1 C)    TempSrc: Oral    SpO2: 96%  95%  Weight:  131.5 kg   Height:  5' 11 (1.803 m)     Constitutional: NAD, calm, comfortable Vitals:   02/06/24 1321 02/06/24 1324 02/06/24 1630  BP: (!) 153/68  (!) 142/64  Pulse: (!) 47  (!) 46  Resp: 20  17  Temp: 98.7 F (37.1 C)    TempSrc: Oral    SpO2: 96%  95%  Weight:  131.5 kg   Height:  5' 11 (1.803 m)    Eyes: PERRL, lids and conjunctivae normal ENMT: Mucous membranes are moist. Posterior pharynx clear of any exudate or lesions.Normal dentition.  Neck: normal, supple, no masses, no thyromegaly Respiratory: clear to auscultation bilaterally, no wheezing, no crackles. Normal respiratory effort. No accessory muscle use.  Cardiovascular: Regular rate and rhythm, no murmurs / rubs / gallops.  Anasarca to bilateral upper thighs,. 2+ pedal pulses. No carotid bruits.  Abdomen: no tenderness, no masses palpated. No hepatosplenomegaly. Bowel sounds positive.  Musculoskeletal: no clubbing / cyanosis. No joint deformity upper and lower extremities. Good ROM, no contractures. Normal muscle tone.  Skin: Multiple small ulcers on bilateral lower extremity with clear fluid weeping out, no rash no tenderness Neurologic: CN 2-12 grossly intact. Sensation intact, DTR normal. Strength 5/5 in all 4.  Psychiatric: Normal judgment and insight. Alert and  oriented x 3. Normal mood.     Labs on Admission: I have personally reviewed following labs and imaging studies  CBC: Recent Labs  Lab 02/06/24 1326  WBC 5.3  HGB 9.4*  HCT 32.3*  MCV 94.7  PLT 163   Basic Metabolic Panel: Recent Labs  Lab 02/06/24 1326  NA 141  K 3.7  CL 105  CO2 26  GLUCOSE 146*  BUN 40*  CREATININE 1.95*  CALCIUM  8.9   GFR: Estimated Creatinine Clearance: 50.9 mL/min (A) (by C-G formula based on SCr of 1.95 mg/dL (H)). Liver Function Tests: Recent Labs  Lab 02/06/24 1326  AST 16  ALT 13  ALKPHOS 74  BILITOT 1.4*  PROT 7.6  ALBUMIN 3.3*   No results for input(s): LIPASE, AMYLASE in the last 168 hours. No results for input(s): AMMONIA in the last 168 hours. Coagulation Profile: No results for input(s): INR, PROTIME in the last 168 hours. Cardiac Enzymes: No results for input(s): CKTOTAL, CKMB, CKMBINDEX, TROPONINI in the last 168 hours. BNP (last 3 results) No results for input(s): PROBNP in the last 8760 hours. HbA1C: No results for input(s): HGBA1C in the last 72 hours. CBG: No results for input(s): GLUCAP in the last 168 hours. Lipid Profile: No results for input(s): CHOL, HDL, LDLCALC, TRIG, CHOLHDL, LDLDIRECT in the last 72 hours. Thyroid Function Tests: No results for input(s): TSH, T4TOTAL, FREET4, T3FREE, THYROIDAB in the last 72 hours. Anemia Panel: No results for input(s): VITAMINB12, FOLATE, FERRITIN, TIBC, IRON, RETICCTPCT in the last 72 hours. Urine analysis:    Component Value Date/Time   COLORURINE STRAW (A) 09/11/2023 1241   APPEARANCEUR CLEAR (A) 09/11/2023 1241   LABSPEC 1.009 09/11/2023 1241   PHURINE 5.0 09/11/2023 1241   GLUCOSEU NEGATIVE 09/11/2023 1241   HGBUR NEGATIVE 09/11/2023 1241   BILIRUBINUR NEGATIVE 09/11/2023 1241   KETONESUR NEGATIVE 09/11/2023 1241   PROTEINUR NEGATIVE 09/11/2023 1241   NITRITE NEGATIVE 09/11/2023 1241   LEUKOCYTESUR  NEGATIVE 09/11/2023 1241    Radiological Exams on Admission: DG Chest 2 View Result Date: 02/06/2024 EXAM: 2 VIEW(S) XRAY OF THE  CHEST 02/06/2024 01:45:00 PM COMPARISON: 09/11/2023 CLINICAL HISTORY: Sent for fluid overload. Bilateral LE swelling since about 1 week. Legs are wrapped--wrapped this AM. Pt wears 3L at baseline since 11/28/23. FINDINGS: LUNGS AND PLEURA: Bilateral prominent interstitial markings. Possible trace bilateral pleural effusions. HEART AND MEDIASTINUM: Cardiomegaly, stable. BONES AND SOFT TISSUES: No acute osseous abnormality. IMPRESSION: 1. Cardiomegaly, stable. 2. Bilateral prominent interstitial markings and possible trace bilateral pleural effusions, consistent with fluid overload. Electronically signed by: Waddell Calk MD 02/06/2024 02:26 PM EDT RP Workstation: [id number]    EKG: Independently reviewed.  A-fib, bradycardia, reported as junction rhythm  Assessment/Plan Principal Problem:   CHF (congestive heart failure) (HCC) Active Problems:   A-fib (HCC)   Obesity, Class III, BMI 40-49.9 (morbid obesity)  (please populate well all problems here in Problem List. (For example, if patient is on BP meds at home and you resume or decide to hold them, it is a problem that needs to be her. Same for CAD, COPD, HLD and so on)  Acute on chronic HFpEF decompensation - Failed outpatient management - Given his multisite, agreed with 60 mg IV Lasix  to begin with, 1 dose of metolazone  given today - Echocardiogram was done within 6 months, will not repeat - Strict I&O's and daily weight - Abdominal ultrasound to rule out ascites, if positive consider paracentesis  Bradycardia Chronic A-fib - Appears to be overmedicated on both beta-blocker and CCB, discontinue Cardizem due to worsening of peripheral edema - Hold off beta-blocker and amiodarone  - Start as needed Lopressor  for rate control - Continue Eliquis   HTN - On high-dose Lasix  - Given the history of kidney function  surge while on high dose of diuresis, we will hold off ACEI as well. - Consider hydralazine  plus Imdur regimen if needed  Morbid obesity OSA - BMI= 40 - Patient not interested in GLP-1 agonist, claiming that he has been trying to cut down calorie intake himself. - Continue CPAP at bedtime  CKD stage IIIb - Severe volume overload, creatinine level stable - IV diuresis, daily BMP  IDDM - Cut on Lantus  dosage from 30 units to 15 units daily - SSI - Hold off metformin, start Januvia  Chronic lower extremity ulcers - Stable as was evaluated by wound care this week - Outpatient follow-up with wound care  Total time spent on patient care 55 minutes.  DVT prophylaxis: Eliquis  Code Status: Full code Family Communication: Wife at bedside Disposition Plan: Patient is sick with CHF decompensation failed outpatient management, given he also has other complicated conditions such as CKD and morbid obesity, expect IV diuresis will need at least 3 to 5 days, expect more than 2 midnight hospital stay Consults called: None Admission status: Telemetry admission   Cort ONEIDA Mana MD [facility] Pager [phone]  02/06/2024, 5:15 PM

## 2024-02-06 NOTE — ED Notes (Signed)
 Called CCMD to place pt on central monitoring

## 2024-02-06 NOTE — Progress Notes (Signed)
 Here for [employer] boot change bilat w/ CMA staff.  Edema on Lt leg much improved, but has 2+ pitting just below the knee where UNNA boot was not covering.  Edema slightly improved on the Rt, but Rt foot ulcer is much improved.  CMA to wrap both legs again & to wrap higher than previous levels bilat.  Change in 1 wk.  If ulceration on Rt foot continues to improve w/ wkly changes discussed that we can cancel his wound clinic appt that is currently scheduled for 10/7.  Has appt in HF clinic this afternoon for further assistance w/ HF meds & diuretic therapy.  They are aware to keep this appointment.  Requesting refill of amiodarone  & lisinopril  today, but told to wait & address this w/ HF clinic this pm as these may be adjusted based on their recommendations.    Additionally requesting refills on buspar, for a 30 day supply to go to [facility] & a 90 day supply to go to [facility].  His taking 5 mg BID & they report he is much improved since starting on this medication.  Requested refills sent in.

## 2024-02-06 NOTE — ED Notes (Signed)
 Report given to [place], RN

## 2024-02-06 NOTE — ED Triage Notes (Signed)
 Pt to ED with wife from [facility] cardiology for bilateral LE swelling since about 1 week. Legs are wrapped--wrapped this AM. Pt wears 3L at baseline since 11/28/23.  Respirations unlabored, skin dry.

## 2024-02-06 NOTE — Progress Notes (Signed)
 Patient here to have UNNA boots changed until seen by wound clinic on Oct 2nd

## 2024-02-06 NOTE — ED Provider Notes (Signed)
[facility] Provider Note    Event Date/Time   First MD Initiated Contact with Patient 02/06/24 1618     (approximate)   History   Leg Swelling  Patient to ED from [facility] cardiology for fluid overload.   Pt to ED with wife from [facility] cardiology for bilateral LE swelling since about 1 week. Legs are wrapped--wrapped this AM. Pt wears 3L at baseline since 11/28/23.  Respirations unlabored, skin dry.   HPI Rodney Oneal is a 68 y.o. male PMH COPD on 3 L nasal cannula baseline, hypertension, hyperlipidemia, obesity, T2DM, A-fib on Eliquis , HFpEF presents for evaluation of bilateral lower extremity edema - Patient has been having worsening bilateral lower extremity edema over the past 3 to 5 weeks.  Had been taking torsemide  as prescribed which is every other day but continues to have significant weight gain.  Has gained about 20 pounds since late July (5-6 wks).  Sent here from heart failure clinic after evaluation today. - Is on 3 L nasal cannula at baseline, no interval change - Does note feels his swelling is now extending up to his belly - No cough, fever, pain anywhere - Was told in clinic there was going to lower discontinue a few of his medications due to bradycardia - Also seen in wound care earlier today for right lower extremity, images of wound in chart, noted to be very well-healing  Per chart review, last echo (TEE) 08/14/23: CONCLUSION -------------------------------------------------------------------------------  1. No left atrial appendage thrombus.  2. Left ventricular systolic function appears normal.  3. Normal appearing pulmonary veins with appropriate systolic flow.  4. There is a small pericardial effusion present.       Physical Exam   Triage Vital Signs: ED Triage Vitals  Encounter Vitals Group     BP 02/06/24 1321 (!) 153/68     Girls Systolic BP Percentile --      Girls Diastolic BP Percentile --      Boys Systolic BP Percentile --       Boys Diastolic BP Percentile --      Pulse Rate 02/06/24 1321 (!) 47     Resp 02/06/24 1321 20     Temp 02/06/24 1321 98.7 F (37.1 C)     Temp Source 02/06/24 1321 Oral     SpO2 02/06/24 1321 96 %     Weight 02/06/24 1324 290 lb (131.5 kg)     Height 02/06/24 1324 5' 11 (1.803 m)     Head Circumference --      Peak Flow --      Pain Score 02/06/24 1321 0     Pain Loc --      Pain Education --      Exclude from Growth Chart --     Most recent vital signs: Vitals:   02/06/24 1321 02/06/24 1630  BP: (!) 153/68 (!) 142/64  Pulse: (!) 47 (!) 46  Resp: 20 17  Temp: 98.7 F (37.1 C)   SpO2: 96% 95%     General: Awake, no distress.  CV:  Good peripheral perfusion. RRR, RP 2+ Resp:  Normal effort.  Crackles in bilateral bases, lungs otherwise clear Abd:  No distention. Nontender to deep palpation throughout Other:  Significant BLE edema extending up to her lower abdomen   ED Results / Procedures / Treatments   Labs (all labs ordered are listed, but only abnormal results are displayed) Labs Reviewed  CBC - Abnormal; Notable for the following components:  Result Value   RBC 3.41 (*)    Hemoglobin 9.4 (*)    HCT 32.3 (*)    MCHC 29.1 (*)    RDW 19.4 (*)    All other components within normal limits  COMPREHENSIVE METABOLIC PANEL WITH GFR - Abnormal; Notable for the following components:   Glucose, Bld 146 (*)    BUN 40 (*)    Creatinine, Ser 1.95 (*)    Albumin 3.3 (*)    Total Bilirubin 1.4 (*)    GFR, Estimated 37 (*)    All other components within normal limits  BRAIN NATRIURETIC PEPTIDE - Abnormal; Notable for the following components:   B Natriuretic Peptide 538.6 (*)    All other components within normal limits  TROPONIN I (HIGH SENSITIVITY)  TROPONIN I (HIGH SENSITIVITY)     EKG  See ED course below   RADIOLOGY Radiology interpreted by myself and radiology report reviewed.  Notable for pulmonary edema.    PROCEDURES:  Critical Care  performed: No  Procedures   MEDICATIONS ORDERED IN ED: Medications  furosemide  (LASIX ) injection 60 mg (has no administration in time range)     IMPRESSION / MDM / ASSESSMENT AND PLAN / ED COURSE  I reviewed the triage vital signs and the nursing notes.                              DDX/MDM/AP: Differential diagnosis includes, but is not limited to, CHF exacerbation that has failed outpatient management, doubt renal failure or cirrhosis contributing.  Doubt pneumonia, ACS.  Bradycardia likely secondary to known medications, asymptomatic, not hypotensive --will check electrolytes and continue to monitor.  Plan: - Labs - Chest x-ray - Cardiac monitor - EKG - Anticipate need for diuresis and likely admission  Patient's presentation is most consistent with acute presentation with potential threat to life or bodily function.  The patient is on the cardiac monitor to evaluate for evidence of arrhythmia and/or significant heart rate changes.  ED course below.  Admitted to hospitalist service.  Treating with 60 mg IV Lasix  here in emergency department.  Clinical Course as of 02/06/24 1701  Tue Feb 06, 2024  1624 CMP reviewed, overall within baseline range [MM]  1624 CBC reviewed, mild anemia compared to baseline, suspect dilutional in the setting of fluid overload [MM]  1625 Trop wnl [MM]  1625 BNP elevated, no prior for comparison [MM]  1626 Chest x-ray interpreted by myself and radiology report reviewed, consistent with pulmonary edema  IMPRESSION: 1. Cardiomegaly, stable. 2. Bilateral prominent interstitial markings and possible trace bilateral pleural effusions, consistent with fluid overload.   [MM]  1627 Ecg = junctional rhythm versus sinus rhythm, rate 47, no gross ST elevation or depression, some T wave inversions in leads III, aVF.  Normal axis, normal intervals.  No evidence of ischemia and arrhythmia my interpretation. [MM]  1652 Hospitalist consult order placed [MM]     Clinical Course User Index [MM] Clarine Ozell LABOR, MD     FINAL CLINICAL IMPRESSION(S) / ED DIAGNOSES   Final diagnoses:  Acute on chronic congestive heart failure, unspecified heart failure type (HCC)  Hypervolemia, unspecified hypervolemia type  Bradycardia     Rx / DC Orders   ED Discharge Orders     None        Note:  This document was prepared using Dragon voice recognition software and may include unintentional dictation errors.   Clarine Ozell LABOR, MD  02/06/24 1702  

## 2024-02-06 NOTE — ED Triage Notes (Signed)
 Patient to ED from [facility] cardiology for fluid overload.

## 2024-02-07 ENCOUNTER — Inpatient Hospital Stay

## 2024-02-07 DIAGNOSIS — I5021 Acute systolic (congestive) heart failure: Secondary | ICD-10-CM | POA: Diagnosis not present

## 2024-02-07 LAB — PROTEIN, PLEURAL OR PERITONEAL FLUID: Total protein, fluid: 4.3 g/dL

## 2024-02-07 LAB — BASIC METABOLIC PANEL WITH GFR
Anion gap: 10 (ref 5–15)
BUN: 37 mg/dL — ABNORMAL HIGH (ref 8–23)
CO2: 27 mmol/L (ref 22–32)
Calcium: 8.9 mg/dL (ref 8.9–10.3)
Chloride: 106 mmol/L (ref 98–111)
Creatinine, Ser: 1.93 mg/dL — ABNORMAL HIGH (ref 0.61–1.24)
GFR, Estimated: 37 mL/min — ABNORMAL LOW (ref >60)
Glucose, Bld: 137 mg/dL — ABNORMAL HIGH (ref 70–99)
Potassium: 4 mmol/L (ref 3.5–5.1)
Sodium: 143 mmol/L (ref 135–145)

## 2024-02-07 LAB — CBG MONITORING, ED
Glucose-Capillary: 130 mg/dL — ABNORMAL HIGH (ref 70–99)
Glucose-Capillary: 131 mg/dL — ABNORMAL HIGH (ref 70–99)
Glucose-Capillary: 117 mg/dL — ABNORMAL HIGH (ref 70–99)
Glucose-Capillary: 122 mg/dL — ABNORMAL HIGH (ref 70–99)

## 2024-02-07 LAB — GLUCOSE, PLEURAL OR PERITONEAL FLUID: Glucose, Fluid: 137 mg/dL

## 2024-02-07 LAB — LACTATE DEHYDROGENASE, PLEURAL OR PERITONEAL FLUID: LD, Fluid: 91 U/L — ABNORMAL HIGH (ref 3–23)

## 2024-02-07 MED ORDER — LIDOCAINE HCL (PF) 1 % IJ SOLN
10.0000 mL | Freq: Once | INTRAMUSCULAR | Status: AC
  Administered 2024-02-07: 10 mL via INTRADERMAL

## 2024-02-07 MED ORDER — LOSARTAN POTASSIUM 25 MG PO TABS
25.0000 mg | ORAL_TABLET | Freq: Every day | ORAL | Status: DC
Start: 2024-02-07 — End: 2024-02-09
  Administered 2024-02-07 – 2024-02-08 (×2): 25 mg via ORAL
  Filled 2024-02-07 (×2): qty 1

## 2024-02-07 NOTE — TOC CM/SW Note (Signed)
.  Transition of Care [facility]) - Inpatient Brief Assessment   Patient Details  Name: Rodney Oneal MRN: [id number] Date of Birth: 05/07/1956  Transition of [facility]) CM/SW Contact:    [name], LCSW Phone Number: 02/07/2024, 9:16 AM   Clinical Narrative:  TOC to handoff to heart failure team   Transition of Care Asessment:

## 2024-02-07 NOTE — ED Notes (Signed)
Patient to US for procedure

## 2024-02-07 NOTE — Progress Notes (Signed)
 PROGRESS NOTE  Rodney Oneal    DOB: 02-Dec-1955, 68 y.o.  FMW:[id number]    Code Status: Full Code   DOA: 02/06/2024   LOS: 1   Brief hospital course  Rodney Oneal is a 68 y.o. male with a PMH significant for chronic HFpEF, PAF on Eliquis , HTN, HLD, IDDM, morbid obesity, OSAS on CPAP, CKD stage IIIb, sent from cardiology for failed outpatient management of CHF for IV diuresis.   ED Course: Afebrile, bradycardia heart rate in the 45-47, afebrile, blood pressure 153/68, chest x-ray showed cardiomegaly, blood work showed creatinine 1.9 compared to baseline 1.4-2.0, potassium 3.7 BUN 40 glucose 146 albumin 3.3.   Patient was given IV Lasix  60 mg x 1 in the ED. Renal function tolerated well and swelling has improved. Baseline on 3Lnc and is currently on 4L.   Assessment & Plan  Principal Problem:   CHF (congestive heart failure) (HCC) Active Problems:   A-fib (HCC)   Obesity, Class III, BMI 40-49.9 (morbid obesity)  Acute on chronic HFpEF decompensation- s/p 1 dose of metolazone  given in ED - continue IV diuretics - strict I/O, daily weights.  - did consult cardiology as patient is undergoing several medication changes altering his chronic management and prefer their input.  - wean to chronic 3L as tolerated. Unsure why he is chronically on O2  Ascites- likely transudative but dx labs ordered.  - IR consulted to perform paracentesis or evaluate if it is safe to do so - eliquis  has been held   Bradycardia POA  Chronic A-fib- home BB, CCB being held currently. He states he has had symptoms associated with bradycardia such as orthostatics positive. Will let them washout and not likely to restart CCB given significant peripheral edema - restart eliquis  after procedures - continue telemetry  - appreciate cardiology input  HTN- poorly controlled  - On high-dose Lasix  - given losartan - overall will be renal protective. May see slight Cr bump but his renal function has tolerated IV diuresis  well and is stable so far.    Morbid obesity  OSA - Continue CPAP at bedtime   CKD stage IIIb - Severe volume overload, creatinine level stable - IV diuresis, daily BMP   IDDM - Lantus  15 units daily - SSI - Hold off metformin,  - start Jardiance     Chronic lower extremity ulcers - Stable as was evaluated by wound care this week - Outpatient follow-up with wound care   Body mass index is 40.45 kg/m.  VTE ppx:  apixaban  (ELIQUIS ) tablet 5 mg   Diet:     Diet   Diet heart healthy/carb modified Room service appropriate? Yes; Fluid consistency: Thin   Consultants: Cardiology   Subjective 02/07/24    Pt reports feeling much better. Leg swelling and breathing are improved significantly    Objective  Blood pressure (!) 115/42, pulse (!) 52, temperature 98.2 F (36.8 C), temperature source Oral, resp. rate 17, height 5' 11 (1.803 m), weight 131.5 kg, SpO2 90%.  Intake/Output Summary (Last 24 hours) at 02/07/2024 9177 Last data filed at 02/07/2024 0700 Gross per 24 hour  Intake --  Output 2050 ml  Net -2050 ml   Filed Weights   02/06/24 1324  Weight: 131.5 kg    Physical Exam:  General: awake, alert, NAD HEENT: atraumatic, clear conjunctiva, anicteric sclera, MMM, hearing grossly normal Respiratory: normal respiratory effort. Cardiovascular: quick capillary refill, normal S1/S2, RRR, no JVD, murmurs Gastrointestinal: soft, NT, obese Nervous: A&O x3. no gross focal  neurologic deficits, normal speech Extremities: LE with chronic venous stasis- wrapped bilaterally with unna boot style dressing Skin: dry, intact, normal temperature, normal color. No rashes, lesions or ulcers on exposed skin Psychiatry: normal mood, congruent affect  Labs   I have personally reviewed the following labs and imaging studies CBC    Component Value Date/Time   WBC 5.3 02/06/2024 1326   RBC 3.41 (L) 02/06/2024 1326   HGB 9.4 (L) 02/06/2024 1326   HCT 32.3 (L) 02/06/2024 1326   PLT  163 02/06/2024 1326   MCV 94.7 02/06/2024 1326   MCH 27.6 02/06/2024 1326   MCHC 29.1 (L) 02/06/2024 1326   RDW 19.4 (H) 02/06/2024 1326   LYMPHSABS 1.2 02/28/2019 0838   MONOABS 0.6 02/28/2019 0838   EOSABS 0.5 02/28/2019 0838   BASOSABS 0.1 02/28/2019 0838      Latest Ref Rng & Units 02/07/2024    5:00 AM 02/06/2024    1:26 PM 09/14/2023    4:57 AM  BMP  Glucose 70 - 99 mg/dL 862  853  842   BUN 8 - 23 mg/dL 37  40  47   Creatinine 0.61 - 1.24 mg/dL 8.06  8.04  8.58   Sodium 135 - 145 mmol/L 143  141  141   Potassium 3.5 - 5.1 mmol/L 4.0  3.7  3.8   Chloride 98 - 111 mmol/L 106  105  108   CO2 22 - 32 mmol/L 27  26  24    Calcium  8.9 - 10.3 mg/dL 8.9  8.9  9.1     US  ASCITES (ABDOMEN LIMITED) Result Date: 02/06/2024 CLINICAL DATA:  Abdominal distension EXAM: LIMITED ABDOMEN ULTRASOUND FOR ASCITES TECHNIQUE: Limited ultrasound survey for ascites was performed in all four abdominal quadrants. COMPARISON:  07/12/2013 FINDINGS: 4 quadrant ultrasound was performed. There is diffuse ascites, most pronounced within the lower abdomen. IMPRESSION: 1. Diffuse abdominal ascites. Electronically Signed   By: Ozell Daring M.D.   On: 02/06/2024 17:40   DG Chest 2 View Result Date: 02/06/2024 EXAM: 2 VIEW(S) XRAY OF THE CHEST 02/06/2024 01:45:00 PM COMPARISON: 09/11/2023 CLINICAL HISTORY: Sent for fluid overload. Bilateral LE swelling since about 1 week. Legs are wrapped--wrapped this AM. Pt wears 3L at baseline since 11/28/23. FINDINGS: LUNGS AND PLEURA: Bilateral prominent interstitial markings. Possible trace bilateral pleural effusions. HEART AND MEDIASTINUM: Cardiomegaly, stable. BONES AND SOFT TISSUES: No acute osseous abnormality. IMPRESSION: 1. Cardiomegaly, stable. 2. Bilateral prominent interstitial markings and possible trace bilateral pleural effusions, consistent with fluid overload. Electronically signed by: Waddell Calk MD 02/06/2024 02:26 PM EDT RP Workstation: [id number]   Disposition  Plan & Communication  Patient status: Inpatient  Admitted From: Home Planned disposition location: Home Anticipated discharge date: 9/11 pending clinical improvement, O2 wean  Family Communication: none    Author: Marien LITTIE Piety, DO [facility] 02/07/2024, 8:22 AM   Available by Epic secure chat 7AM-7PM. If 7PM-7AM, please contact night-coverage.  [facility] contact information found on [email or web address].

## 2024-02-07 NOTE — ED Notes (Signed)
 Pt observed lying in bed with eyes closed. Pt wearing CPAP machine at this time. NAD. Bed in lowest position. Call bell within reach.

## 2024-02-07 NOTE — ED Notes (Signed)
 Pt was taken to the hall restroom in a wheelchair by EDT Christina. Pt was educated on the use of using the call bell when completed and pt used the call bell appropriately. Pt was wheeled back to their room and placed in the recliner in the room and reconnected to VS monitoring equipment. Pt tolerated activity well. Pt's call bell is in reach and no other needs are voiced at this time.

## 2024-02-07 NOTE — Progress Notes (Incomplete)
 Heart Failure Stewardship Pharmacy Note  PCP: Don Lauraine Collar, NP PCP-Cardiologist: None  HPI: Rodney Oneal is a 68 y.o. male with chronic HFpEF, PAF on Eliquis  s/p ablation, HTN, HLD, IDDM, morbid obesity, OSA on CPAP, CKD stage IIIb who presented with lower extremity edema. On admission, BNP was 538.6, HS-troponin was 15. Chest x-ray noted to be consistent with fluid overload. Abdominal US  noted diffuse abdominal ascites.   Pertinent cardiac history: History of HFpEF and AF. Underwent ablation for AF 07/2023  Pertinent Lab Values: Creatinine, Ser  Date Value Ref Range Status  02/07/2024 1.93 (H) 0.61 - 1.24 mg/dL Final   BUN  Date Value Ref Range Status  02/07/2024 37 (H) 8 - 23 mg/dL Final   Potassium  Date Value Ref Range Status  02/07/2024 4.0 3.5 - 5.1 mmol/L Final   Sodium  Date Value Ref Range Status  02/07/2024 143 135 - 145 mmol/L Final   B Natriuretic Peptide  Date Value Ref Range Status  02/06/2024 538.6 (H) 0.0 - 100.0 pg/mL Final    Comment:    Performed at [facility], [address] [place], [place] [address]   Magnesium  Date Value Ref Range Status  09/11/2023 2.3 1.7 - 2.4 mg/dL Final    Comment:    Performed at [facility], [address] [place], [place] [address]   Hgb A1c MFr Bld  Date Value Ref Range Status  09/12/2023 6.1 (H) 4.8 - 5.6 % Final    Comment:    (NOTE) Pre diabetes:          5.7%-6.4%  Diabetes:              >6.4%  Glycemic control for   <7.0% adults with diabetes     Vital Signs: Admission weight: Temp:  [97.7 F (36.5 C)-98.7 F (37.1 C)] 98.2 F (36.8 C) (09/10 0744) Pulse Rate:  [43-55] 52 (09/10 0730) Cardiac Rhythm: Sinus bradycardia (09/10 0300) Resp:  [10-25] 17 (09/10 0730) BP: (107-163)/(40-77) 115/42 (09/10 0730) SpO2:  [88 %-100 %] 90 % (09/10 0730) Weight:  [131.5 kg (290 lb)] 131.5 kg (290 lb) (09/09 1324)  Intake/Output Summary (Last 24 hours) at 02/07/2024 0805 Last  data filed at 02/07/2024 0700 Gross per 24 hour  Intake --  Output 2050 ml  Net -2050 ml    Current Heart Failure Medications:  Loop diuretic: Beta-Blocker: ACEI/ARB/ARNI: MRA: [id number]: Other:  Prior to admission Heart Failure Medications:  Loop diuretic: Beta-Blocker: ACEI/ARB/ARNI: MRA: [id number]: Other:  Assessment: 1. {CHL AMB Acute or Chronic:210917265}  - Plan: 1) Medication changes recommended at this time:  2) Patient assistance:   3) Education: -To be completed prior to discharge.  *** Medication Assistance / Insurance Benefits Check: Does the patient have prescription insurance?    Type of insurance plan:  Does the patient qualify for medication assistance through manufacturers or grants? {CHL AMB Yes/No/Pending:210917269}  Eligible grants and/or patient assistance programs: ***  Medication assistance applications in progress: ***  Medication assistance applications approved: *** Approved medication assistance renewals will be completed by: ***  Outpatient Pharmacy: Prior to admission outpatient pharmacy: ***      ***

## 2024-02-07 NOTE — Consult Note (Signed)
 [facility] CLINIC CARDIOLOGY CONSULT NOTE       Patient ID: Rodney Oneal MRN: [id number] DOB/AGE: 06/15/1955 68 y.o.  Admit date: 02/06/2024 Referring Physician Dr. Lenon Primary Physician Gauger, Lauraine Collar, NP Primary Cardiologist Dr. Florencio Reason for Consultation CHF  HPI: Rodney Oneal is a 68 y.o. male  with a past medical history of paroxysmal atrial fibrillation s/p ablation ( 08/15/2023, Eliquis ), history CVA, chronic HFpEF, hypertension, hyperlipidemia, morbid obesity, type 2 diabetes, OSA (on CPAP), CKD stage IIIb, tobacco history who presented to the ED on 02/06/2024 for worsening fluid overload sent by outpatient cardiology as patient requires IV diuresis.  Patient with worsening lower extremity swelling and SOB for several weeks.  Patient reports 30 pound increased in past few weeks. Cardiology was consulted for further evaluation.   Work up in the ED notable for odium 141, potassium 3.7, creatinine 1.95, BUN 3.3, GFR 37, hemoglobin 11.4, platelets 163.  BNP elevated 530 in setting of morbid obesity.  CXR with cardiomegaly, bilateral pleural effusions and pulmonary vascular congestion.  Troponins negative x 2.  EKG without acute ischemic changes.  Patient has received 2X of IV Lasix  60 mg and 1x metolazone  5 mg with good urine output yesterday at 2L.   At the time of my evaluation this Am, patient was resting comfortably in ED stretcher.  This patient symptoms in further detail.  Patient states for the past several weeks he has been having worsening lower extremity swelling and shortness of breath.  Patient states he has been taking his torsemide  every other day because in the past we will use to take it every day he said developed generalized weakness and would fall more frequently.  Patient denies any chest pain/pressure, palpitations, abdominal swelling.  Patient reports a good appetite.  Patient states his SOB and lower extremity swelling have improved since admission s/p IV  Lasix .  Review of systems complete and found to be negative unless listed above    Past Medical History:  Diagnosis Date   A-fib [facility])    had ablation 07/29/23   Arthritis    Asthma    BPH (benign prostatic hyperplasia)    CHF (congestive heart failure) (HCC)    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    COPD (chronic obstructive pulmonary disease) (HCC)    Depression    Diabetes mellitus without complication (HCC)    takes insulin . 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    Past Surgical History:  Procedure Laterality Date   CARDIOVERSION N/A 03/27/2023   Procedure: CARDIOVERSION;  Surgeon: Florencio Cara BIRCH, MD;  Location: [facility] ORS;  Service: Cardiovascular;  Laterality: N/A;   CARDIOVERSION N/A 04/19/2023   Procedure: CARDIOVERSION;  Surgeon: Florencio Cara BIRCH, MD;  Location: [facility] ORS;  Service: Cardiovascular;  Laterality: N/A;   CARDIOVERSION N/A 05/10/2023   Procedure: CARDIOVERSION;  Surgeon: Dewane Shiner, DO;  Location: [facility] ORS;  Service: Cardiovascular;  Laterality: N/A;   COLONOSCOPY WITH PROPOFOL      COLONOSCOPY WITH PROPOFOL  N/A 02/28/2019   Procedure: COLONOSCOPY WITH PROPOFOL ;  Surgeon: Gaylyn Gladis PENNER, MD;  Location: [facility] ENDOSCOPY;  Service: Endoscopy;  Laterality: N/A;   kidney stones removed     left rotator  cuff repair     right shoulder surgery      (Not in a hospital admission)  Social History   Socioeconomic History   Marital status: Married    Spouse name: Not on file   Number of children: Not on file   Years of education: Not on file   Highest education level: Not on file  Occupational History   Not on file  Tobacco Use   Smoking status: Former    Current packs/day: 0.00    Average packs/day: 1 pack/day for 37.7  years (37.7 ttl pk-yrs)    Types: Cigarettes    Start date: 68    Quit date: 02/27/2013    Years since quitting: 10.9   Smokeless tobacco: Never  Vaping Use   Vaping status: Never Used  Substance and Sexual Activity   Alcohol use: Not Currently   Drug use: Not Currently   Sexual activity: Not Currently  Other Topics Concern   Not on file  Social History Narrative   Not on file   Social Drivers of Health   Financial Resource Strain: Low Risk  (10/18/2023)   Received from [facility]   Overall Financial Resource Strain (CARDIA)    Difficulty of Paying Living Expenses: Not hard at all  Food Insecurity: No Food Insecurity (10/18/2023)   Received from [facility]   Hunger Vital Sign    Within the past 12 months, you worried that your food would run out before you got the money to buy more.: Never true    Within the past 12 months, the food you bought just didn't last and you didn't have money to get more.: Never true  Transportation Needs: No Transportation Needs (10/18/2023)   Received from [facility] - Transportation    In the past 12 months, has lack of transportation kept you from medical appointments or from getting medications?: No    Lack of Transportation (Non-Medical): No  Physical Activity: Not on file  Stress: Not on file  Social Connections: Moderately Isolated (09/11/2023)   Social Connection and Isolation Panel    Frequency of Communication with Friends and Family: More than three times a week    Frequency of Social Gatherings with Friends and Family: More than three times a week    Attends Religious Services: Never    Database administrator or Organizations: No    Attends Banker Meetings: Never    Marital Status: Married  Catering manager Violence: Not At Risk (09/11/2023)   Humiliation, Afraid, Rape, and Kick questionnaire    Fear of Current or Ex-Partner: No    Emotionally Abused: No     Physically Abused: No    Sexually Abused: No    Family History  Problem Relation Age of Onset   Diabetes Mother    Heart failure Mother    Heart attack Mother    Heart failure Father    Heart attack Brother    Heart failure Brother      Vitals:   02/07/24 0744 02/07/24 0800 02/07/24 0830 02/07/24 0900  BP:  (!) 159/77 (!) 149/54 (!) 146/62  Pulse:  (!) 53 (!) 52 (!) 54  Resp:  20 (!) 24 15  Temp: 98.2 F (36.8 C)     TempSrc: Oral     SpO2:  93% 94% 97%  Weight:      Height:        PHYSICAL EXAM General: Chronically  ill-appearing elderly male, well nourished, in no acute distress. HEENT: Normocephalic and atraumatic. Neck: No JVD.   Lungs: Normal respiratory effort on 4L (baseline 3L).  Bibasilar crackles. Heart: HRRR. Normal S1 and S2 without gallops or murmurs.  Abdomen: Non-distended appearing.  Msk: Normal strength and tone for age. Extremities: Warm and well perfused. No clubbing, cyanosis. Wrapped, + pedal edema.  Neuro: Alert and oriented X 3. Psych: Answers questions appropriately.   Labs: Basic Metabolic Panel: Recent Labs    02/06/24 1326 02/07/24 0500  NA 141 143  K 3.7 4.0  CL 105 106  CO2 26 27  GLUCOSE 146* 137*  BUN 40* 37*  CREATININE 1.95* 1.93*  CALCIUM  8.9 8.9   Liver Function Tests: Recent Labs    02/06/24 1326  AST 16  ALT 13  ALKPHOS 74  BILITOT 1.4*  PROT 7.6  ALBUMIN 3.3*   No results for input(s): LIPASE, AMYLASE in the last 72 hours. CBC: Recent Labs    02/06/24 1326  WBC 5.3  HGB 9.4*  HCT 32.3*  MCV 94.7  PLT 163   Cardiac Enzymes: Recent Labs    02/06/24 1326 02/06/24 1836  TROPONINIHS 15 15   BNP: Recent Labs    02/06/24 1326  BNP 538.6*   D-Dimer: No results for input(s): DDIMER in the last 72 hours. Hemoglobin A1C: No results for input(s): HGBA1C in the last 72 hours. Fasting Lipid Panel: No results for input(s): CHOL, HDL, LDLCALC, TRIG, CHOLHDL, LDLDIRECT in the last  72 hours. Thyroid Function Tests: No results for input(s): TSH, T4TOTAL, T3FREE, THYROIDAB in the last 72 hours.  Invalid input(s): FREET3 Anemia Panel: No results for input(s): VITAMINB12, FOLATE, FERRITIN, TIBC, IRON, RETICCTPCT in the last 72 hours.   Radiology: US  ASCITES (ABDOMEN LIMITED) Result Date: 02/06/2024 CLINICAL DATA:  Abdominal distension EXAM: LIMITED ABDOMEN ULTRASOUND FOR ASCITES TECHNIQUE: Limited ultrasound survey for ascites was performed in all four abdominal quadrants. COMPARISON:  07/12/2013 FINDINGS: 4 quadrant ultrasound was performed. There is diffuse ascites, most pronounced within the lower abdomen. IMPRESSION: 1. Diffuse abdominal ascites. Electronically Signed   By: Ozell Daring M.D.   On: 02/06/2024 17:40   DG Chest 2 View Result Date: 02/06/2024 EXAM: 2 VIEW(S) XRAY OF THE CHEST 02/06/2024 01:45:00 PM COMPARISON: 09/11/2023 CLINICAL HISTORY: Sent for fluid overload. Bilateral LE swelling since about 1 week. Legs are wrapped--wrapped this AM. Pt wears 3L at baseline since 11/28/23. FINDINGS: LUNGS AND PLEURA: Bilateral prominent interstitial markings. Possible trace bilateral pleural effusions. HEART AND MEDIASTINUM: Cardiomegaly, stable. BONES AND SOFT TISSUES: No acute osseous abnormality. IMPRESSION: 1. Cardiomegaly, stable. 2. Bilateral prominent interstitial markings and possible trace bilateral pleural effusions, consistent with fluid overload. Electronically signed by: Waddell Calk MD 02/06/2024 02:26 PM EDT RP Workstation: [id number]   CT CHEST W CONTRAST Result Date: 01/25/2024 CLINICAL DATA:  OSA, COPD EXAM: CT CHEST WITH CONTRAST TECHNIQUE: Multidetector CT imaging of the chest was performed during intravenous contrast administration. RADIATION DOSE REDUCTION: This exam was performed according to the departmental dose-optimization program which includes automated exposure control, adjustment of the mA and/or kV according to patient size  and/or use of iterative reconstruction technique. CONTRAST:  75mL OMNIPAQUE  IOHEXOL  300 MG/ML  SOLN COMPARISON:  09/05/2023 FINDINGS: Cardiovascular: Aortic atherosclerosis. Cardiomegaly. Three-vessel coronary artery calcifications. Small pericardial effusion. Mediastinum/Nodes: Unchanged prominent mediastinal and hilar lymph nodes. Thyroid gland, trachea, and esophagus demonstrate no significant findings. Lungs/Pleura: Small bilateral pleural effusions. Diffuse interlobular septal thickening. Underlying pulmonary fibrosis in a pattern with apical to  basal gradient, featuring irregular peripheral interstitial opacity, septal thickening, traction bronchiectasis, and areas of subpleural bronchiolectasis of the lung bases without clear evidence of honeycombing. Upper Abdomen: Small volume ascites. Cholelithiasis. Unchanged benign left adrenal adenoma requiring no specific further follow-up or characterization. Musculoskeletal: No chest wall abnormality. No acute osseous findings. IMPRESSION: 1. Pulmonary fibrosis in a pattern with apical to basal gradient, featuring irregular peripheral interstitial opacity, septal thickening, traction bronchiectasis, and areas of subpleural bronchiolectasis of the lung bases without clear evidence of honeycombing. 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 2. Small bilateral pleural effusions. Diffuse interlobular septal thickening, consistent with pulmonary edema. 3. Unchanged prominent mediastinal and hilar lymph nodes, likely reactive. 4. Cardiomegaly and coronary artery disease. 5. Small volume ascites. 6. Cholelithiasis. Aortic Atherosclerosis (ICD10-I70.0). Electronically Signed   By: Marolyn JONETTA Jaksch M.D.   On: 01/25/2024 14:28    ECHO 08/14/2023: EF 55%  TELEMETRY reviewed by me 02/07/2024: Sinus rhythm, rate 50s  EKG reviewed by me: Sinus rhythm, rate 47 bpm with nonspecific ST-T wave changes  Data reviewed by me 02/07/2024: last 24h vitals tele labs imaging  I/O ED provider note, admission H&P.  Principal Problem:   CHF (congestive heart failure) (HCC) Active Problems:   Obesity, Class III, BMI 40-49.9 (morbid obesity)   A-fib (HCC)    ASSESSMENT AND PLAN:  Rodney Oneal is a 68 y.o. male  with a past medical history of paroxysmal atrial fibrillation s/p ablation ( 08/15/2023, Eliquis ), history CVA, chronic HFpEF, hypertension, hyperlipidemia, morbid obesity, type 2 diabetes, OSA (on CPAP), CKD stage IIIb, tobacco history who presented to the ED on 02/06/2024 for worsening fluid overload sent by outpatient cardiology as patient requires IV diuresis.  Patient with worsening lower extremity swelling and SOB for several weeks.  Patient reports 30 pound increased in past few weeks. Cardiology was consulted for further evaluation.   # Acute on chronic HFpEF # CKD stage IIIb  BNP elevated 530 in setting of morbid obesity.  CXR with cardiomegaly, bilateral pleural effusions and pulmonary vascular congestion. Abdominal ultrasound reveals diffuse ascites. -Monitor and replenish electrolytes for a goal K >4, Mag >2  -Agree with IV Lasix  60 mg daily. (Home regimen: torsemide  20 mg every other day, PRN)  (Of note, patient states when he took torsemide  daily he would get lightheaded and starting to fall more frequently, denies any syncopal episodes).  -Continue home empagliflozin  10 mg daily.  # Hypertension # Hyperlipidemia Patient without chest pain. Troponins negative x 2.  EKG without acute ischemic changes.  - Continue atorvastatin  80 mg daily. - Ordered losartan  25 mg daily (instead of home lisinopril ). - Coreg  as stated below. (Held)  # Paroxysmal atrial fibrillation s/p ablation 07/2023 # Hx CVA EKG and per tele in sinus rhythm. -Home Eliquis  held for paracentesis today with abdominal US  positive for ascites. Plan to resume after procedure. -Hold home coreg  with patient's baseline bradycardia.    This patient's plan of care was discussed and  created with Dr. Ammon and he is in agreement.  Signed: Dorene Comfort, PA-C  02/07/2024, 11:11 AM [facility] Cardiology

## 2024-02-08 ENCOUNTER — Telehealth (HOSPITAL_COMMUNITY): Payer: Self-pay | Admitting: Pharmacy Technician

## 2024-02-08 ENCOUNTER — Other Ambulatory Visit (HOSPITAL_COMMUNITY): Payer: Self-pay

## 2024-02-08 ENCOUNTER — Inpatient Hospital Stay
Admit: 2024-02-08 | Discharge: 2024-02-08 | Disposition: A | Discharge status: Home / Self Care | Attending: Student in an Organized Health Care Education/Training Program

## 2024-02-08 DIAGNOSIS — I5021 Acute systolic (congestive) heart failure: Secondary | ICD-10-CM

## 2024-02-08 LAB — ECHOCARDIOGRAM COMPLETE
AR max vel: 1.47 cm2
AV Area VTI: 1.55 cm2
AV Area mean vel: 1.4 cm2
AV Mean grad: 19.2 mmHg
AV Peak grad: 35.2 mmHg
Ao pk vel: 2.97 m/s
Area-P 1/2: 4.8 cm2
Calc EF: 62.9 %
Height: 71 in
MV VTI: 2.38 cm2
S' Lateral: 2.9 cm
Single Plane A2C EF: 61.1 %
Single Plane A4C EF: 64 %
Weight: 4345.71 [oz_av]

## 2024-02-08 LAB — BASIC METABOLIC PANEL WITH GFR
Anion gap: 8 (ref 5–15)
BUN: 35 mg/dL — ABNORMAL HIGH (ref 8–23)
CO2: 30 mmol/L (ref 22–32)
Calcium: 8.7 mg/dL — ABNORMAL LOW (ref 8.9–10.3)
Chloride: 103 mmol/L (ref 98–111)
Creatinine, Ser: 1.82 mg/dL — ABNORMAL HIGH (ref 0.61–1.24)
GFR, Estimated: 40 mL/min — ABNORMAL LOW (ref >60)
Glucose, Bld: 96 mg/dL (ref 70–99)
Potassium: 3.8 mmol/L (ref 3.5–5.1)
Sodium: 141 mmol/L (ref 135–145)

## 2024-02-08 LAB — GLUCOSE, CAPILLARY
Glucose-Capillary: 123 mg/dL — ABNORMAL HIGH (ref 70–99)
Glucose-Capillary: 138 mg/dL — ABNORMAL HIGH (ref 70–99)
Glucose-Capillary: 107 mg/dL — ABNORMAL HIGH (ref 70–99)

## 2024-02-08 MED ORDER — APIXABAN 5 MG PO TABS
5.0000 mg | ORAL_TABLET | Freq: Two times a day (BID) | ORAL | Status: DC
  Administered 2024-02-08 – 2024-02-09 (×3): 5 mg via ORAL
  Filled 2024-02-08 (×3): qty 1

## 2024-02-08 MED ORDER — FUROSEMIDE 10 MG/ML IJ SOLN
60.0000 mg | Freq: Two times a day (BID) | INTRAMUSCULAR | Status: DC
  Administered 2024-02-08 – 2024-02-09 (×2): 60 mg via INTRAVENOUS
  Filled 2024-02-08 (×2): qty 6

## 2024-02-08 MED ORDER — PERFLUTREN LIPID MICROSPHERE
1.0000 mL | INTRAVENOUS | Status: AC | PRN
  Administered 2024-02-08: 2 mL via INTRAVENOUS

## 2024-02-08 NOTE — Progress Notes (Addendum)
 [facility] CLINIC CARDIOLOGY PROGRESS NOTE       Patient ID: Rodney Oneal MRN: [id number] DOB/AGE: [birth year] 68 y.o.  Admit date: 02/06/2024 Referring Physician Dr. Lenon Primary Physician Gauger, Lauraine Collar, NP Primary Cardiologist Dr. Florencio Reason for Consultation CHF  HPI: Rodney Oneal is a 68 y.o. male  with a past medical history of paroxysmal atrial fibrillation s/p ablation ( 08/15/2023, Eliquis ), history CVA, chronic HFpEF, hypertension, hyperlipidemia, morbid obesity, type 2 diabetes, OSA (on CPAP), CKD stage IIIb, tobacco history who presented to the ED on 02/06/2024 for worsening fluid overload sent by outpatient cardiology as patient requires IV diuresis.  Patient with worsening lower extremity swelling and SOB for several weeks.  Patient reports 30 pound increased in past few weeks. Cardiology was consulted for further evaluation.   Interval History: -Patient seen and examined this AM and sitting up in hospital bed eating breakfast. Patient states he feels a lot better with improvement to SOB and LEE.  And denies chest pain.  Continues to appear volume up. -Patients BP labile and HR  stable this AM. Overnight Tele showed no significant events.  -Yesterday UOP 2.175L, with stable renal function. Patient states UOP is slowing down today will increase lasix  to BID for today. -Patient remains on 3L with stable SpO2. (At baseline) -s/p paracentesis, removed 2.4L (09/10)   Review of systems complete and found to be negative unless listed above    Past Medical History:  Diagnosis Date   A-fib [facility])    had ablation 07/29/23   Arthritis    Asthma    BPH (benign prostatic hyperplasia)    CHF (congestive heart failure) (HCC)    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    COPD (chronic obstructive pulmonary disease) (HCC)    Depression    Diabetes mellitus without complication (HCC)    takes insulin . 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 and speech impairment sometimes    Past Surgical History:  Procedure Laterality Date   CARDIOVERSION N/A 03/27/2023   Procedure: CARDIOVERSION;  Surgeon: Florencio Cara BIRCH, MD;  Location: [facility] ORS;  Service: Cardiovascular;  Laterality: N/A;   CARDIOVERSION N/A 04/19/2023   Procedure: CARDIOVERSION;  Surgeon: Florencio Cara BIRCH, MD;  Location: [facility] ORS;  Service: Cardiovascular;  Laterality: N/A;   CARDIOVERSION N/A 05/10/2023   Procedure: CARDIOVERSION;  Surgeon: Dewane Shiner, DO;  Location: [facility] ORS;  Service: Cardiovascular;  Laterality: N/A;   COLONOSCOPY WITH PROPOFOL      COLONOSCOPY WITH PROPOFOL  N/A 02/28/2019   Procedure: COLONOSCOPY WITH PROPOFOL ;  Surgeon: Gaylyn Gladis PENNER, MD;  Location: [facility] ENDOSCOPY;  Service: Endoscopy;  Laterality: N/A;   kidney stones removed     left rotator cuff repair     right shoulder surgery      Medications Prior to Admission  Medication Sig Dispense Refill Last Dose/Taking   acetaminophen  (TYLENOL ) 500 MG tablet Take 1,000 mg by mouth every 6 (six) hours as needed for mild pain (pain score 1-3).   Unknown   albuterol (VENTOLIN HFA) 108 (90 Base) MCG/ACT inhaler Inhale 2 puffs into the lungs every 6 (six) hours as needed. for Wheezing for up to 90 days   Unknown   apixaban  (ELIQUIS ) 5 MG TABS  tablet Take 5 mg by mouth.   02/06/2024   atorvastatin  (LIPITOR ) 40 MG tablet Take 40 mg by mouth daily.   02/06/2024   busPIRone (BUSPAR) 5 MG tablet Take 5 mg by mouth 2 (two) times daily.   02/06/2024   carvedilol  (COREG ) 6.25 MG tablet Take 6.25 mg by mouth 2 (two) times daily with a meal.   02/06/2024   Cholecalciferol  (VITAMIN D3) 50 MCG (2000 UT) capsule Take 2,000 Units by mouth in the morning.   02/06/2024   cyanocobalamin  (VITAMIN B12) 1000 MCG tablet Take 1,000 mcg by mouth in the  morning.   02/06/2024   diltiazem (CARDIZEM CD) 240 MG 24 hr capsule Take 240 mg by mouth in the morning.   02/06/2024   empagliflozin  (JARDIANCE ) 10 MG TABS tablet Take 10 mg by mouth.   02/06/2024   FLUoxetine  (PROZAC ) 40 MG capsule Take 40 mg by mouth in the morning.   02/06/2024   fluticasone  (FLONASE ) 50 MCG/ACT nasal spray Place 2 sprays into both nostrils daily.   02/06/2024   fluticasone -salmeterol (ADVAIR) 250-50 MCG/ACT AEPB Inhale 1 puff into the lungs in the morning and at bedtime.   02/06/2024   insulin  degludec (TRESIBA ) 200 UNIT/ML FlexTouch Pen Inject 14 Units into the skin daily.   02/06/2024   insulin  lispro (HUMALOG) 100 UNIT/ML injection Inject 15 Units into the skin See admin instructions. Inject 14 units subcutaneously in the morning and inject 12 units subcutaneously in the evening.   02/06/2024   ipratropium-albuterol (DUONEB) 0.5-2.5 (3) MG/3ML SOLN Inhale 3 mLs into the lungs 4 (four) times daily.   02/06/2024   lisinopril  (ZESTRIL ) 20 MG tablet Take 1 tablet (20 mg total) by mouth in the morning. 30 tablet 1 02/06/2024   Magnesium Oxide 250 MG TABS Take 250 mg by mouth in the morning.   02/06/2024   tamsulosin  (FLOMAX ) 0.4 MG CAPS capsule Take 0.4 mg by mouth at bedtime.   02/05/2024   torsemide  (DEMADEX ) 20 MG tablet Take 20 mg by mouth every other day.   02/05/2024   amiodarone  (PACERONE ) 200 MG tablet Take 200 mg by mouth in the morning. (Patient not taking: Reported on 02/06/2024)   Not Taking   ascorbic acid  (VITAMIN C ) 500 MG tablet Take 500 mg by mouth in the morning. (Patient not taking: Reported on 02/06/2024)   Not Taking   metFORMIN (GLUCOPHAGE) 1000 MG tablet Take 1,000 mg by mouth 2 (two) times daily with a meal. (Patient not taking: Reported on 02/06/2024)   Not Taking   montelukast  (SINGULAIR ) 10 MG tablet Take 1 tablet (10 mg total) by mouth daily as needed (allergies/respiratory issues.). (Patient not taking: Reported on 02/06/2024)   Not Taking   warfarin (COUMADIN) 3 MG tablet Take 3 mg  by mouth every morning. (Patient not taking: Reported on 02/06/2024)   Not Taking   zinc gluconate 50 MG tablet Take 50 mg by mouth in the morning. (Patient not taking: Reported on 02/06/2024)   Not Taking   Social History   Socioeconomic History   Marital status: Married    Spouse name: Not on file   Number of children: Not on file   Years of education: Not on file   Highest education level: Not on file  Occupational History   Not on file  Tobacco Use   Smoking status: Former    Current packs/day: 0.00    Average packs/day: 1 pack/day for 37.7 years (37.7 ttl pk-yrs)    Types: Cigarettes  Start date: 16    Quit date: 02/27/2013    Years since quitting: 10.9   Smokeless tobacco: Never  Vaping Use   Vaping status: Never Used  Substance and Sexual Activity   Alcohol use: Not Currently   Drug use: Not Currently   Sexual activity: Not Currently  Other Topics Concern   Not on file  Social History Narrative   Not on file   Social Drivers of Health   Financial Resource Strain: Low Risk  (10/18/2023)   Received from [facility]   Overall Financial Resource Strain (CARDIA)    Difficulty of Paying Living Expenses: Not hard at all  Food Insecurity: No Food Insecurity (02/07/2024)   Hunger Vital Sign    Worried About Running Out of Food in the Last Year: Never true    Ran Out of Food in the Last Year: Never true  Transportation Needs: No Transportation Needs (02/07/2024)   PRAPARE - Administrator, Civil Service (Medical): No    Lack of Transportation (Non-Medical): No  Physical Activity: Not on file  Stress: Not on file  Social Connections: Moderately Isolated (02/07/2024)   Social Connection and Isolation Panel    Frequency of Communication with Friends and Family: More than three times a week    Frequency of Social Gatherings with Friends and Family: More than three times a week    Attends Religious Services: Never    Database administrator or  Organizations: No    Attends Banker Meetings: Never    Marital Status: Married  Catering manager Violence: Not At Risk (02/07/2024)   Humiliation, Afraid, Rape, and Kick questionnaire    Fear of Current or Ex-Partner: No    Emotionally Abused: No    Physically Abused: No    Sexually Abused: No    Family History  Problem Relation Age of Onset   Diabetes Mother    Heart failure Mother    Heart attack Mother    Heart failure Father    Heart attack Brother    Heart failure Brother      Vitals:   02/07/24 2258 02/08/24 0400 02/08/24 0506 02/08/24 0804  BP: (!) 141/70 (!) 110/41  131/62  Pulse: (!) 55 (!) 49  (!) 54  Resp: 20 16  20   Temp: 97.7 F (36.5 C) 97.9 F (36.6 C)  97.6 F (36.4 C)  TempSrc:  Oral    SpO2: 93% 97%  90%  Weight:   123.2 kg   Height:        PHYSICAL EXAM General: Chronically ill-appearing elderly male, well nourished, in no acute distress. HEENT: Normocephalic and atraumatic. Neck: No JVD.   Lungs: Normal respiratory effort on 3L (baseline 3L).  Mild Bibasilar crackles. Heart: HRRR. Normal S1 and S2 without gallops or murmurs.  Abdomen: Non-distended appearing.  Msk: Normal strength and tone for age. Extremities: Warm and well perfused. No clubbing, cyanosis. Wrapped, + pedal edema.  Neuro: Alert and oriented X 3. Psych: Answers questions appropriately.   Labs: Basic Metabolic Panel: Recent Labs    02/07/24 0500 02/08/24 0338  NA 143 141  K 4.0 3.8  CL 106 103  CO2 27 30  GLUCOSE 137* 96  BUN 37* 35*  CREATININE 1.93* 1.82*  CALCIUM  8.9 8.7*   Liver Function Tests: Recent Labs    02/06/24 1326  AST 16  ALT 13  ALKPHOS 74  BILITOT 1.4*  PROT 7.6  ALBUMIN 3.3*   No  results for input(s): LIPASE, AMYLASE in the last 72 hours. CBC: Recent Labs    02/06/24 1326  WBC 5.3  HGB 9.4*  HCT 32.3*  MCV 94.7  PLT 163   Cardiac Enzymes: Recent Labs    02/06/24 1326 02/06/24 1836  TROPONINIHS 15 15    BNP: Recent Labs    02/06/24 1326  BNP 538.6*   D-Dimer: No results for input(s): DDIMER in the last 72 hours. Hemoglobin A1C: No results for input(s): HGBA1C in the last 72 hours. Fasting Lipid Panel: No results for input(s): CHOL, HDL, LDLCALC, TRIG, CHOLHDL, LDLDIRECT in the last 72 hours. Thyroid Function Tests: No results for input(s): TSH, T4TOTAL, T3FREE, THYROIDAB in the last 72 hours.  Invalid input(s): FREET3 Anemia Panel: No results for input(s): VITAMINB12, FOLATE, FERRITIN, TIBC, IRON, RETICCTPCT in the last 72 hours.   Radiology: US  Paracentesis Result Date: 02/07/2024 INDICATION: 68 year old male with a history of heart failure, CKD stage 3, was sent to the ED by his cardiologist due to fluid overload. Found to have abdominal distension and imaging significant for ascites. Request for diagnostic and therapeutic paracentesis. EXAM: ULTRASOUND GUIDED diagnostic and therapeutic, left-sided PARACENTESIS MEDICATIONS: 1% lidocaine , 8 mL. COMPLICATIONS: None immediate. PROCEDURE: Informed written consent was obtained from the patient after a discussion of the risks, benefits and alternatives to treatment. A timeout was performed prior to the initiation of the procedure. Initial ultrasound scanning demonstrates a large amount of ascites within the left lower abdominal quadrant. The left lower abdomen was prepped and draped in the usual sterile fashion. 1% lidocaine  was used for local anesthesia. Following this, a 19 gauge, 12-cm, Yueh catheter was introduced. An ultrasound image was saved for documentation purposes. The paracentesis was performed. The catheter was removed and a dressing was applied. The patient tolerated the procedure well without immediate post procedural complication. FINDINGS: A total of approximately 2.4 L of amber fluid was removed. Samples were sent to the laboratory as requested by the clinical team. IMPRESSION: Successful  ultrasound-guided paracentesis yielding 2.4 liters of peritoneal fluid. Procedure performed by: Sherrilee Bal, PA-C under the supervision of Dr. VEAR Lent Electronically Signed   By: Wilkie Lent M.D.   On: 02/07/2024 16:56   US  ASCITES (ABDOMEN LIMITED) Result Date: 02/06/2024 CLINICAL DATA:  Abdominal distension EXAM: LIMITED ABDOMEN ULTRASOUND FOR ASCITES TECHNIQUE: Limited ultrasound survey for ascites was performed in all four abdominal quadrants. COMPARISON:  07/12/2013 FINDINGS: 4 quadrant ultrasound was performed. There is diffuse ascites, most pronounced within the lower abdomen. IMPRESSION: 1. Diffuse abdominal ascites. Electronically Signed   By: Ozell Daring M.D.   On: 02/06/2024 17:40   DG Chest 2 View Result Date: 02/06/2024 EXAM: 2 VIEW(S) XRAY OF THE CHEST 02/06/2024 01:45:00 PM COMPARISON: 09/11/2023 CLINICAL HISTORY: Sent for fluid overload. Bilateral LE swelling since about 1 week. Legs are wrapped--wrapped this AM. Pt wears 3L at baseline since 11/28/23. FINDINGS: LUNGS AND PLEURA: Bilateral prominent interstitial markings. Possible trace bilateral pleural effusions. HEART AND MEDIASTINUM: Cardiomegaly, stable. BONES AND SOFT TISSUES: No acute osseous abnormality. IMPRESSION: 1. Cardiomegaly, stable. 2. Bilateral prominent interstitial markings and possible trace bilateral pleural effusions, consistent with fluid overload. Electronically signed by: Waddell Calk MD 02/06/2024 02:26 PM EDT RP Workstation: [id number]   CT CHEST W CONTRAST Result Date: 01/25/2024 CLINICAL DATA:  OSA, COPD EXAM: CT CHEST WITH CONTRAST TECHNIQUE: Multidetector CT imaging of the chest was performed during intravenous contrast administration. RADIATION DOSE REDUCTION: This exam was performed according to the departmental dose-optimization program which includes  automated exposure control, adjustment of the mA and/or kV according to patient size and/or use of iterative reconstruction technique.  CONTRAST:  75mL OMNIPAQUE  IOHEXOL  300 MG/ML  SOLN COMPARISON:  09/05/2023 FINDINGS: Cardiovascular: Aortic atherosclerosis. Cardiomegaly. Three-vessel coronary artery calcifications. Small pericardial effusion. Mediastinum/Nodes: Unchanged prominent mediastinal and hilar lymph nodes. Thyroid gland, trachea, and esophagus demonstrate no significant findings. Lungs/Pleura: Small bilateral pleural effusions. Diffuse interlobular septal thickening. Underlying pulmonary fibrosis in a pattern with apical to basal gradient, featuring irregular peripheral interstitial opacity, septal thickening, traction bronchiectasis, and areas of subpleural bronchiolectasis of the lung bases without clear evidence of honeycombing. Upper Abdomen: Small volume ascites. Cholelithiasis. Unchanged benign left adrenal adenoma requiring no specific further follow-up or characterization. Musculoskeletal: No chest wall abnormality. No acute osseous findings. IMPRESSION: 1. Pulmonary fibrosis in a pattern with apical to basal gradient, featuring irregular peripheral interstitial opacity, septal thickening, traction bronchiectasis, and areas of subpleural bronchiolectasis of the lung bases without clear evidence of honeycombing. 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 2. Small bilateral pleural effusions. Diffuse interlobular septal thickening, consistent with pulmonary edema. 3. Unchanged prominent mediastinal and hilar lymph nodes, likely reactive. 4. Cardiomegaly and coronary artery disease. 5. Small volume ascites. 6. Cholelithiasis. Aortic Atherosclerosis (ICD10-I70.0). Electronically Signed   By: Marolyn JONETTA Jaksch M.D.   On: 01/25/2024 14:28    ECHO ordered.  08/14/2023: EF 55% 1. No left atrial appendage thrombus.  2. Left ventricular systolic function appears normal.  3. Normal appearing pulmonary veins with appropriate systolic flow.  4. There is a small pericardial effusion present.   TELEMETRY reviewed by me  02/08/2024: Sinus rhythm, rate 50s  EKG reviewed by me: Sinus rhythm, rate 47 bpm with nonspecific ST-T wave changes  Data reviewed by me 02/08/2024: last 24h vitals tele labs imaging I/O ED provider note, admission H&P.  Principal Problem:   CHF (congestive heart failure) (HCC) Active Problems:   Obesity, Class III, BMI 40-49.9 (morbid obesity)   A-fib (HCC)    ASSESSMENT AND PLAN:  Rodney Oneal is a 68 y.o. male  with a past medical history of paroxysmal atrial fibrillation s/p ablation ( 08/15/2023, Eliquis ), history CVA, chronic HFpEF, hypertension, hyperlipidemia, morbid obesity, type 2 diabetes, OSA (on CPAP), CKD stage IIIb, tobacco history who presented to the ED on 02/06/2024 for worsening fluid overload sent by outpatient cardiology as patient requires IV diuresis.  Patient with worsening lower extremity swelling and SOB for several weeks.  Patient reports 30 pound increased in past few weeks. Cardiology was consulted for further evaluation.   # Acute on chronic HFpEF # CKD stage IIIb  BNP elevated 530 in setting of morbid obesity.  CXR with cardiomegaly, bilateral pleural effusions and pulmonary vascular congestion. Abdominal ultrasound reveals diffuse ascites s/p paracentesis, removed 2.4L (09/10) -Echo pending.  -Monitor and replenish electrolytes for a goal K >4, Mag >2  -Increased IV Lasix  60 mg to BID. (Home regimen: torsemide  20 mg every other day, PRN)  (Of note, patient states when he took torsemide  daily he would get lightheaded and starting to fall more frequently, denies any syncopal episodes).  -Continue home empagliflozin  10 mg daily.  # Hypertension # Hyperlipidemia Patient without chest pain. Troponins negative x 2.  EKG without acute ischemic changes.  - Continue atorvastatin  80 mg daily. - Continue losartan  25 mg daily (instead of home lisinopril ). - Coreg  as stated below. (Held)  # Paroxysmal atrial fibrillation s/p ablation 07/2023 # Hx CVA EKG and per  tele in sinus rhythm. -Continue  home Eliquis  5 mg BID for stroke risk reduction.  -Hold home coreg  with patient's baseline bradycardia.    This patient's plan of care was discussed and created with Dr. Ammon and he is in agreement.  Signed: Hakeem Frazzini, PA-C  02/08/2024, 8:45 AM [facility] Cardiology

## 2024-02-08 NOTE — TOC Initial Note (Signed)
 Transition of Care [facility]) - Initial/Assessment Note    Patient Details  Name: Rodney Oneal MRN: [id number] Date of Birth: 22-Mar-1956  Transition of [facility]) CM/SW Contact:    Lauraine JAYSON Carpen, LCSW Phone Number: 02/08/2024, 11:28 AM  Clinical Narrative:  Readmission prevention screen complete. CSW met with patient. Wife at bedside. CSW introduced role and explained that discharge planning would be discussed. PCP is Lauraine Leak, NP. Wife has recently been driving him to his appointments. [facility] [place], [facility], and [facility] in [place]. No issues obtaining medications. Patient lives home with his wife. No home health prior to admission. He did recently complete 7 days of home IV abx. He has a RW, rollator, and wheelchair at home. He uses 3 L chronic oxygen through [employer]. No further concerns. CSW encouraged patient and his wife to contact CSW as needed. Wife will transport him home at discharge and has his oxygen in the car for the ride.                Expected Discharge Plan: Home/Self Care Barriers to Discharge: Continued Medical Work up   Patient Goals and CMS Choice            Expected Discharge Plan and Services     Post Acute Care Choice: NA Living arrangements for the past 2 months: Single Family Home                                      Prior Living Arrangements/Services Living arrangements for the past 2 months: Single Family Home Lives with:: Spouse Patient language and need for interpreter reviewed:: Yes Do you feel safe going back to the place where you live?: Yes      Need for Family Participation in Patient Care: Yes (Comment) Care giver support system in place?: Yes (comment) Current home services: DME Criminal Activity/Legal Involvement Pertinent to Current Situation/Hospitalization: No - Comment as needed  Activities of Daily Living   ADL Screening (condition at time of admission) Independently performs ADLs?: No Does the  patient have a NEW difficulty with bathing/dressing/toileting/self-feeding that is expected to last >3 days?: No Does the patient have a NEW difficulty with getting in/out of bed, walking, or climbing stairs that is expected to last >3 days?: Yes (Initiates electronic notice to provider for possible PT consult) Does the patient have a NEW difficulty with communication that is expected to last >3 days?: No Is the patient deaf or have difficulty hearing?: No Does the patient have difficulty seeing, even when wearing glasses/contacts?: No Does the patient have difficulty concentrating, remembering, or making decisions?: No  Permission Sought/Granted Permission sought to share information with : Family Supports Permission granted to share information with : Yes, Verbal Permission Granted  Share Information with NAME: Hubbert Landrigan     Permission granted to share info w Relationship: Wife  Permission granted to share info w Contact Information: [phone]  Emotional Assessment Appearance:: Appears stated age Attitude/Demeanor/Rapport: Engaged, Gracious Affect (typically observed): Accepting, Appropriate, Calm, Pleasant Orientation: : Oriented to Place, Oriented to Self, Oriented to  Time, Oriented to Situation Alcohol / Substance Use: Not Applicable Psych Involvement: No (comment)  Admission diagnosis:  Bradycardia [R00.1] CHF (congestive heart failure) (HCC) [I50.9] Hypervolemia, unspecified hypervolemia type [E87.70] Acute on chronic congestive heart failure, unspecified heart failure type [facility]) [I50.9] Patient Active Problem List   Diagnosis Date Noted  CHF (congestive heart failure) (HCC) 02/06/2024   Fall 12/21/2023   Head injury 12/21/2023   Bilateral leg edema 12/21/2023   Weakness 09/14/2023   Dehydration 09/14/2023   Hyperphosphatemia 09/14/2023   A-fib (HCC)    AKI (acute kidney injury) (HCC) 09/11/2023   Hyperkalemia 09/11/2023   Insulin  dependent type 2 diabetes  mellitus (HCC) 09/11/2023   Essential hypertension 09/11/2023   Hyperlipidemia 09/11/2023   OSA (obstructive sleep apnea) 09/11/2023   Hypertriglyceridemia 09/11/2023   Abdominal obesity and metabolic syndrome 09/11/2023   Obesity, Class III, BMI 40-49.9 (morbid obesity) 09/11/2023   PCP:  Don Lauraine Collar, NP Pharmacy:   [facility] [address] [place] - [address] [place] [address] Phone: [phone] Fax: [phone]  [facility] [address] [place] [address] Phone: [phone] Fax: [phone]     Social Drivers of Health (SDOH) Social History: SDOH Screenings   Food Insecurity: No Food Insecurity (02/07/2024)  Housing: Low Risk  (02/07/2024)  Transportation Needs: No Transportation Needs (02/07/2024)  Utilities: Not At Risk (02/07/2024)  Financial Resource Strain: Low Risk  (10/18/2023)   Received from [facility]  Social Connections: Moderately Isolated (02/07/2024)  Tobacco Use: Medium Risk (02/06/2024)   SDOH Interventions:     Readmission Risk Interventions    02/08/2024   11:26 AM  Readmission Risk Prevention Plan  Transportation Screening Complete  PCP or Specialist Appt within 3-5 Days Complete  Social Work Consult for Recovery Care Planning/Counseling Complete  Palliative Care Screening Not Applicable  Medication Review Oceanographer) Complete

## 2024-02-08 NOTE — Progress Notes (Signed)
 Heart Failure Navigator Progress Note  Assessed for Heart & Vascular TOC clinic readiness.  Patient does not meet criteria due to current [facility] patient.   Navigator will sign off at this time.  Charmaine Pines, RN, BSN [facility] Heart Failure Navigator Secure Chat Only

## 2024-02-08 NOTE — Consult Note (Addendum)
 WOC team consulted for unna boots. Patient has unna boots placed weekly at [facility]; last placed 02/06/2024 and is due to be changed weekly, next due 02/13/2024. Has wound R foot per their note and is scheduled to [facility]/29/[facility]24    WOC team will follow 02/13/2024 for next unna boot change if remains inpatient, if discharged should follow at [facility].    Thank you,    Powell Bar MSN, RN-BC, [employer]

## 2024-02-08 NOTE — Progress Notes (Addendum)
 PROGRESS NOTE  Rodney Oneal    DOB: [birth year], 68 y.o.  FMW:[id number]    Code Status: Full Code   DOA: 02/06/2024   LOS: 2   Brief hospital course  Rodney Oneal is a 68 y.o. male with a PMH significant for chronic HFpEF, PAF on Eliquis , HTN, HLD, IDDM, morbid obesity, OSAS on CPAP, CKD stage IIIb, sent from cardiology for failed outpatient management of CHF for IV diuresis.   ED Course: Afebrile, bradycardia heart rate in the 45-47, afebrile, blood pressure 153/68, chest x-ray showed cardiomegaly, blood work showed creatinine 1.9 compared to baseline 1.4-2.0, potassium 3.7 BUN 40 glucose 146 albumin 3.3.   Patient was given IV Lasix  60 mg x 1 in the ED. Renal function tolerated well and swelling has improved. Baseline on 3Lnc and is currently on 4L.   Assessment & Plan  Principal Problem:   CHF (congestive heart failure) (HCC) Active Problems:   A-fib (HCC)   Obesity, Class III, BMI 40-49.9 (morbid obesity)  Acute on chronic HFpEF decompensation- BNP elevated 530. CXR with cardiomegaly, bilateral pleural effusions and pulmonary vascular congestion. Abdominal ultrasound reveals diffuse ascites s/p paracentesis, removed 2.4L  s/p 1 dose of metolazone  + IV lasix  given in ED. Down almost 10lbs since presentation.  - continue IV diuretics. Increased to BID today.  - strict I/O, daily weights.  - did consult cardiology as patient is undergoing several medication changes altering his chronic management and prefer their input.  - weaned to his chronic 3L but still getting short of breath with exertion to bathroom. Unsure why he is chronically on O2 - no recent TTE. Will get a repeat complete echo since patient endorsing a recent change in his symptoms.   Ascites- likely transudative but dx labs ordered. 2.4L removed on 9/10. - f/u labs   Bradycardia POA  Chronic A-fib- home BB, CCB being held currently. He states he has had symptoms associated with bradycardia such as orthostatics  positive. Will let them washout and not likely to restart CCB given significant peripheral edema - restarted eliquis  after procedures - continue telemetry  - appreciate cardiology input  HTN- poorly controlled  - On high-dose Lasix  - given losartan - overall will be renal protective. May see slight Cr bump but his renal function has tolerated IV diuresis well and is stable so far. Seems to have tolerated well.    Morbid obesity  OSA - Continue CPAP at bedtime   CKD stage IIIb - Severe volume overload, creatinine level stable - IV diuresis, daily BMP   IDDM - Lantus  15 units daily - SSI - Hold off metformin - start Jardiance     Chronic lower extremity ulcers - Stable as was evaluated by wound care this week - Outpatient follow-up with wound care   Body mass index is 37.88 kg/m.  VTE ppx: eliquis    Diet:     Diet   Diet heart healthy/carb modified Room service appropriate? Yes; Fluid consistency: Thin   Consultants: Cardiology   Subjective 02/08/24    Rodney Oneal reports feeling much better. Leg swelling and breathing are improved significantly. Still having DOE going to bathroom. Urine output steeply declined throughout the night yesterday   Objective  Blood pressure (!) 115/42, pulse (!) 52, temperature 98.2 F (36.8 C), temperature source Oral, resp. rate 17, height 5' 11 (1.803 m), weight 131.5 kg, SpO2 90%.  Intake/Output Summary (Last 24 hours) at 02/08/2024 0727 Last data filed at 02/07/2024 1647 Gross per 24 hour  Intake 3  ml  Output 2175 ml  Net -2172 ml   Filed Weights   02/06/24 1324 02/08/24 0506  Weight: 131.5 kg 123.2 kg    Physical Exam:  General: awake, alert, NAD HEENT: atraumatic, clear conjunctiva, anicteric sclera, MMM, hearing grossly normal Respiratory: normal respiratory effort. Cardiovascular: quick capillary refill, normal S1/S2, RRR, no JVD, murmurs Gastrointestinal: soft, NT, obese Nervous: A&O x3. no gross focal neurologic deficits,  normal speech Extremities: LE with chronic venous stasis- wrapped bilaterally with unna boot style dressing Skin: dry, intact, normal temperature, normal color. No rashes, lesions or ulcers on exposed skin Psychiatry: normal mood, congruent affect  Labs   I have personally reviewed the following labs and imaging studies CBC    Component Value Date/Time   WBC 5.3 02/06/2024 1326   RBC 3.41 (L) 02/06/2024 1326   HGB 9.4 (L) 02/06/2024 1326   HCT 32.3 (L) 02/06/2024 1326   PLT 163 02/06/2024 1326   MCV 94.7 02/06/2024 1326   MCH 27.6 02/06/2024 1326   MCHC 29.1 (L) 02/06/2024 1326   RDW 19.4 (H) 02/06/2024 1326   LYMPHSABS 1.2 02/28/2019 0838   MONOABS 0.6 02/28/2019 0838   EOSABS 0.5 02/28/2019 0838   BASOSABS 0.1 02/28/2019 0838      Latest Ref Rng & Units 02/08/2024    3:38 AM 02/07/2024    5:00 AM 02/06/2024    1:26 PM  BMP  Glucose 70 - 99 mg/dL 96  862  853   BUN 8 - 23 mg/dL 35  37  40   Creatinine 0.61 - 1.24 mg/dL 8.17  8.06  8.04   Sodium 135 - 145 mmol/L 141  143  141   Potassium 3.5 - 5.1 mmol/L 3.8  4.0  3.7   Chloride 98 - 111 mmol/L 103  106  105   CO2 22 - 32 mmol/L 30  27  26    Calcium  8.9 - 10.3 mg/dL 8.7  8.9  8.9     US  Paracentesis Result Date: 02/07/2024 INDICATION: 68 year old male with a history of heart failure, CKD stage 3, was sent to the ED by his cardiologist due to fluid overload. Found to have abdominal distension and imaging significant for ascites. Request for diagnostic and therapeutic paracentesis. EXAM: ULTRASOUND GUIDED diagnostic and therapeutic, left-sided PARACENTESIS MEDICATIONS: 1% lidocaine , 8 mL. COMPLICATIONS: None immediate. PROCEDURE: Informed written consent was obtained from the patient after a discussion of the risks, benefits and alternatives to treatment. A timeout was performed prior to the initiation of the procedure. Initial ultrasound scanning demonstrates a large amount of ascites within the left lower abdominal quadrant. The  left lower abdomen was prepped and draped in the usual sterile fashion. 1% lidocaine  was used for local anesthesia. Following this, a 19 gauge, 12-cm, Yueh catheter was introduced. An ultrasound image was saved for documentation purposes. The paracentesis was performed. The catheter was removed and a dressing was applied. The patient tolerated the procedure well without immediate post procedural complication. FINDINGS: A total of approximately 2.4 L of amber fluid was removed. Samples were sent to the laboratory as requested by the clinical team. IMPRESSION: Successful ultrasound-guided paracentesis yielding 2.4 liters of peritoneal fluid. Procedure performed by: Sherrilee Bal, PA-C under the supervision of Dr. VEAR Lent Electronically Signed   By: Wilkie Lent M.D.   On: 02/07/2024 16:56   US  ASCITES (ABDOMEN LIMITED) Result Date: 02/06/2024 CLINICAL DATA:  Abdominal distension EXAM: LIMITED ABDOMEN ULTRASOUND FOR ASCITES TECHNIQUE: Limited ultrasound survey for ascites was performed in  all four abdominal quadrants. COMPARISON:  07/12/2013 FINDINGS: 4 quadrant ultrasound was performed. There is diffuse ascites, most pronounced within the lower abdomen. IMPRESSION: 1. Diffuse abdominal ascites. Electronically Signed   By: Ozell Daring M.D.   On: 02/06/2024 17:40   DG Chest 2 View Result Date: 02/06/2024 EXAM: 2 VIEW(S) XRAY OF THE CHEST 02/06/2024 01:45:00 PM COMPARISON: 09/11/2023 CLINICAL HISTORY: Sent for fluid overload. Bilateral LE swelling since about 1 week. Legs are wrapped--wrapped this AM. Rodney Oneal wears 3L at baseline since 11/28/23. FINDINGS: LUNGS AND PLEURA: Bilateral prominent interstitial markings. Possible trace bilateral pleural effusions. HEART AND MEDIASTINUM: Cardiomegaly, stable. BONES AND SOFT TISSUES: No acute osseous abnormality. IMPRESSION: 1. Cardiomegaly, stable. 2. Bilateral prominent interstitial markings and possible trace bilateral pleural effusions, consistent with fluid  overload. Electronically signed by: Waddell Calk MD 02/06/2024 02:26 PM EDT RP Workstation: [id number]   Disposition Plan & Communication  Patient status: Inpatient  Admitted From: Home Planned disposition location: Home Anticipated discharge date: 9/12 pending clinical improvement, continued IV diuretics  Family Communication: none    Author: Marien LITTIE Piety, DO [facility] 02/08/2024, 7:27 AM   Available by Epic secure chat 7AM-7PM. If 7PM-7AM, please contact night-coverage.  [facility] contact information found on [email or web address].

## 2024-02-08 NOTE — Telephone Encounter (Signed)
 Pharmacy Patient Advocate Encounter  Insurance verification completed.    The patient is insured through [employer]. Patient has Medicare and is not eligible for a copay card, but may be able to apply for patient assistance or Medicare RX Payment Plan (Patient Must reach out to their plan, if eligible for payment plan), if available.    Ran test claim for Jardiance  25mg  tablets and the current 30 day co-pay is $0.00.  Ran test claim for Entresto  24-26mg  tablets and the current 30 day co-pay is $0.00. Generic was not covered.  Ran test claim for Kerendia 10mg  tablets and a prior authorization is required.   This test claim was processed through [facility]- copay amounts may vary at other pharmacies due to pharmacy/plan contracts, or as the patient moves through the different stages of their insurance plan.

## 2024-02-08 NOTE — Care Management Important Message (Signed)
 Important Message  Patient Details  Name: Rodney Oneal MRN: [id number] Date of Birth: 1955-07-07   Important Message Given:  Yes - Medicare IM     [name] 02/08/2024, 12:39 PM

## 2024-02-08 NOTE — Progress Notes (Signed)
 Heart Failure Stewardship Pharmacy Note  PCP: Don Lauraine Collar, NP PCP-Cardiologist: None  HPI: Rodney Oneal is a 68 y.o. male with chronic HFpEF, PAF on Eliquis  s/p ablation, HTN, HLD, IDDM, morbid obesity, OSA on CPAP, CKD stage IIIb who presented with lower extremity edema. On admission, BNP was 538.6, HS-troponin was 15. Chest x-ray noted to be consistent with fluid overload. Abdominal US  noted diffuse abdominal ascites s/p 2.4L paracentesis.  Pertinent cardiac history: History of HFpEF and AF. Underwent ablation for AF 07/2023. Repeat TTE ordered.  Pertinent Lab Values: Creatinine, Ser  Date Value Ref Range Status  02/08/2024 1.82 (H) 0.61 - 1.24 mg/dL Final   BUN  Date Value Ref Range Status  02/08/2024 35 (H) 8 - 23 mg/dL Final   Potassium  Date Value Ref Range Status  02/08/2024 3.8 3.5 - 5.1 mmol/L Final   Sodium  Date Value Ref Range Status  02/08/2024 141 135 - 145 mmol/L Final   B Natriuretic Peptide  Date Value Ref Range Status  02/06/2024 538.6 (H) 0.0 - 100.0 pg/mL Final    Comment:    Performed at [facility], [address] [place], [place] [address]   Magnesium  Date Value Ref Range Status  09/11/2023 2.3 1.7 - 2.4 mg/dL Final    Comment:    Performed at [facility], [address] [place], [place] [address]   Hgb A1c MFr Bld  Date Value Ref Range Status  09/12/2023 6.1 (H) 4.8 - 5.6 % Final    Comment:    (NOTE) Pre diabetes:          5.7%-6.4%  Diabetes:              >6.4%  Glycemic control for   <7.0% adults with diabetes     Vital Signs:  Temp:  [97.7 F (36.5 C)-98.4 F (36.9 C)] 97.9 F (36.6 C) (09/11 0400) Pulse Rate:  [49-55] 49 (09/11 0400) Cardiac Rhythm: Sinus bradycardia (09/10 1900) Resp:  [13-24] 16 (09/11 0400) BP: (110-159)/(41-77) 110/41 (09/11 0400) SpO2:  [93 %-98 %] 97 % (09/11 0400) Weight:  [123.2 kg (271 lb 9.7 oz)] 123.2 kg (271 lb 9.7 oz) (09/11 0506)  Intake/Output Summary  (Last 24 hours) at 02/08/2024 0756 Last data filed at 02/07/2024 1647 Gross per 24 hour  Intake 3 ml  Output 2175 ml  Net -2172 ml    Current Heart Failure Medications:  Loop diuretic: furosemide  60 mg IV BID Beta-Blocker: none ACEI/ARB/ARNI: losartan  25 mg daily MRA: none SGLT2i: Jardiance  10 mg daily Other:  Prior to admission Heart Failure Medications:  Loop diuretic: torsemide  20 mg every other day Beta-Blocker: none ACEI/ARB/ARNI: lisinopril  20 mg daily MRA: none SGLT2i: Jardiance  10 mg daily Other: none  Assessment: 1. Acute on chronic diastolic heart failure (LVEF >55%)  , due to presumed NICM. NYHA class II symptoms.  -Symptoms: Patient reports feeling much better after the paracentesis. Reports improving LEE, no SOB, and no orthopnea. -Volume: Appears closer to euvolemic. Continue furosemide  60 mg IV BID. Legs are wrapped. Would start daily torsemide  on discharge. -Hemodynamics: BP elevated. HR 40-50s. -SGLT2i: Continue Jardiance  10 mg daily. -MRA: Would benefit from spironolactone  for HFpEF, however, renal function is tenuous. Can consider finerenone for benefits in CKD with DM. -ARNI: Patient may benefit from Entresto  given elevated BP and hypervolemia. Can consider   Plan: 1) Medication changes recommended at this time: -None  2) Patient assistance: -Pending  3) Education: - Patient has been educated on  current HF medications and potential additions to HF medication regimen - Patient verbalizes understanding that over the next few months, these medication doses may change and more medications may be added to optimize HF regimen - Patient has been educated on basic disease state pathophysiology and goals of therapy  Medication Assistance / Insurance Benefits Check: Does the patient have prescription insurance?    Type of insurance plan:  Does the patient qualify for medication assistance through manufacturers or grants? Pending   Outpatient Pharmacy: Prior  to admission outpatient pharmacy: [facility]      Please do not hesitate to reach out with questions or concerns,  Jaun Bash, PharmD, CPP, BCPS, [facility] Heart Failure Pharmacist  Phone - [phone] 02/08/2024 10:53 AM

## 2024-02-08 NOTE — Procedures (Signed)
 PROCEDURE SUMMARY:  Successful US  guided paracentesis from left lateral abdomen.  Yielded 2.4 liters of amber fluid.  No immediate complications.  Patient tolerated well.  EBL = trace  Specimen sent for labs.  [place] Mckeough CHRISTELLA Bal PA-C 02/08/2024 8:11 AM

## 2024-02-09 ENCOUNTER — Other Ambulatory Visit: Payer: Self-pay

## 2024-02-09 DIAGNOSIS — E8770 Fluid overload, unspecified: Secondary | ICD-10-CM

## 2024-02-09 DIAGNOSIS — R001 Bradycardia, unspecified: Secondary | ICD-10-CM | POA: Diagnosis not present

## 2024-02-09 DIAGNOSIS — I509 Heart failure, unspecified: Secondary | ICD-10-CM | POA: Diagnosis not present

## 2024-02-09 LAB — GLUCOSE, CAPILLARY: Glucose-Capillary: 118 mg/dL — ABNORMAL HIGH (ref 70–99)

## 2024-02-09 LAB — BASIC METABOLIC PANEL WITH GFR
Anion gap: 10 (ref 5–15)
BUN: 34 mg/dL — ABNORMAL HIGH (ref 8–23)
CO2: 31 mmol/L (ref 22–32)
Calcium: 8.7 mg/dL — ABNORMAL LOW (ref 8.9–10.3)
Chloride: 101 mmol/L (ref 98–111)
Creatinine, Ser: 1.78 mg/dL — ABNORMAL HIGH (ref 0.61–1.24)
GFR, Estimated: 41 mL/min — ABNORMAL LOW (ref >60)
Glucose, Bld: 93 mg/dL (ref 70–99)
Potassium: 3.5 mmol/L (ref 3.5–5.1)
Sodium: 142 mmol/L (ref 135–145)

## 2024-02-09 MED ORDER — SACUBITRIL-VALSARTAN 24-26 MG PO TABS
1.0000 | ORAL_TABLET | Freq: Two times a day (BID) | ORAL | Status: DC
  Administered 2024-02-09: 1 via ORAL
  Filled 2024-02-09: qty 1

## 2024-02-09 MED ORDER — LOSARTAN POTASSIUM 50 MG PO TABS: 50.0000 mg | ORAL_TABLET | Freq: Every day | ORAL | Status: DC

## 2024-02-09 MED ORDER — SPIRONOLACTONE 25 MG PO TABS
25.0000 mg | ORAL_TABLET | Freq: Every day | ORAL | Status: DC
  Administered 2024-02-09: 25 mg via ORAL
  Filled 2024-02-09: qty 1

## 2024-02-09 MED ORDER — SACUBITRIL-VALSARTAN 24-26 MG PO TABS
1.0000 | ORAL_TABLET | Freq: Two times a day (BID) | ORAL | 0 refills | Status: AC
  Filled 2024-02-09: qty 60, 30d supply, fill #0

## 2024-02-09 MED ORDER — TORSEMIDE 20 MG PO TABS: 20.0000 mg | ORAL_TABLET | Freq: Every day | ORAL | Status: AC

## 2024-02-09 MED ORDER — SPIRONOLACTONE 25 MG PO TABS
25.0000 mg | ORAL_TABLET | Freq: Every day | ORAL | 0 refills | Status: AC
  Filled 2024-02-09: qty 60, 60d supply, fill #0

## 2024-02-09 MED ORDER — LINAGLIPTIN 5 MG PO TABS
5.0000 mg | ORAL_TABLET | Freq: Every day | ORAL | 0 refills | Status: AC
  Filled 2024-02-09: qty 60, 60d supply, fill #0

## 2024-02-09 NOTE — Progress Notes (Incomplete)
 PROGRESS NOTE  Rodney Oneal    DOB: February 23, 1956, 68 y.o.  FMW:[id number]    Code Status: Full Code   DOA: 02/06/2024   LOS: 3   Brief hospital course  Rodney Oneal is a 68 y.o. male with a PMH significant for chronic HFpEF, PAF on Eliquis , HTN, HLD, IDDM, morbid obesity, OSAS on CPAP, CKD stage IIIb, sent from cardiology for failed outpatient management of CHF for IV diuresis.   ED Course: Afebrile, bradycardia heart rate in the 45-47, afebrile, blood pressure 153/68, chest x-ray showed cardiomegaly, blood work showed creatinine 1.9 compared to baseline 1.4-2.0, potassium 3.7 BUN 40 glucose 146 albumin 3.3.   Patient was given IV Lasix  60 mg x 1 in the ED. Renal function tolerated well and swelling has improved. Baseline on 3Lnc and is currently on 4L.   Assessment & Plan  Principal Problem:   CHF (congestive heart failure) (HCC) Active Problems:   A-fib (HCC)   Obesity, Class III, BMI 40-49.9 (morbid obesity)  Acute on chronic HFpEF decompensation- BNP elevated 530. CXR with cardiomegaly, bilateral pleural effusions and pulmonary vascular congestion. Abdominal ultrasound reveals diffuse ascites s/p paracentesis, removed 2.4L  s/p 1 dose of metolazone  + IV lasix  given in ED. Down almost 10lbs since presentation.  - continue IV diuretics. Increased to BID today.  - strict I/O, daily weights.  - did consult cardiology as patient is undergoing several medication changes altering his chronic management and prefer their input.  - weaned to his chronic 3L but still getting short of breath with exertion to bathroom. Unsure why he is chronically on O2 - no recent TTE. Will get a repeat complete echo since patient endorsing a recent change in his symptoms.   Ascites- likely transudative but dx labs ordered. 2.4L removed on 9/10. - f/u labs   Bradycardia POA  Chronic A-fib- home BB, CCB being held currently. He states he has had symptoms associated with bradycardia such as orthostatics  positive. Will let them washout and not likely to restart CCB given significant peripheral edema - restarted eliquis  after procedures - continue telemetry  - appreciate cardiology input  HTN- poorly controlled  - On high-dose Lasix  - given losartan - overall will be renal protective. May see slight Cr bump but his renal function has tolerated IV diuresis well and is stable so far. Seems to have tolerated well.    Morbid obesity  OSA - Continue CPAP at bedtime   CKD stage IIIb - Severe volume overload, creatinine level stable - IV diuresis, daily BMP   IDDM - Lantus  15 units daily - SSI - Hold off metformin - start Jardiance     Chronic lower extremity ulcers - Stable as was evaluated by wound care this week - Outpatient follow-up with wound care   Body mass index is 37.79 kg/m.  VTE ppx: eliquis    Diet:     Diet   Diet heart healthy/carb modified Room service appropriate? Yes; Fluid consistency: Thin   Consultants: Cardiology   Subjective 02/09/24    Pt reports feeling much better. Leg swelling and breathing are improved significantly. Still having DOE going to bathroom. Urine output steeply declined throughout the night yesterday   Objective  Blood pressure (!) 115/42, pulse (!) 52, temperature 98.2 F (36.8 C), temperature source Oral, resp. rate 17, height 5' 11 (1.803 m), weight 131.5 kg, SpO2 90%.  Intake/Output Summary (Last 24 hours) at 02/09/2024 0741 Last data filed at 02/09/2024 0328 Gross per 24 hour  Intake 420  ml  Output 1750 ml  Net -1330 ml   Filed Weights   02/06/24 1324 02/08/24 0506 02/09/24 0500  Weight: 131.5 kg 123.2 kg 122.9 kg    Physical Exam:  General: awake, alert, NAD HEENT: atraumatic, clear conjunctiva, anicteric sclera, MMM, hearing grossly normal Respiratory: normal respiratory effort. Cardiovascular: quick capillary refill, normal S1/S2, RRR, no JVD, murmurs Gastrointestinal: soft, NT, obese Nervous: A&O x3. no gross focal  neurologic deficits, normal speech Extremities: LE with chronic venous stasis- wrapped bilaterally with unna boot style dressing Skin: dry, intact, normal temperature, normal color. No rashes, lesions or ulcers on exposed skin Psychiatry: normal mood, congruent affect  Labs   I have personally reviewed the following labs and imaging studies CBC    Component Value Date/Time   WBC 5.3 02/06/2024 1326   RBC 3.41 (L) 02/06/2024 1326   HGB 9.4 (L) 02/06/2024 1326   HCT 32.3 (L) 02/06/2024 1326   PLT 163 02/06/2024 1326   MCV 94.7 02/06/2024 1326   MCH 27.6 02/06/2024 1326   MCHC 29.1 (L) 02/06/2024 1326   RDW 19.4 (H) 02/06/2024 1326   LYMPHSABS 1.2 02/28/2019 0838   MONOABS 0.6 02/28/2019 0838   EOSABS 0.5 02/28/2019 0838   BASOSABS 0.1 02/28/2019 0838      Latest Ref Rng & Units 02/09/2024    3:58 AM 02/08/2024    3:38 AM 02/07/2024    5:00 AM  BMP  Glucose 70 - 99 mg/dL 93  96  862   BUN 8 - 23 mg/dL 34  35  37   Creatinine 0.61 - 1.24 mg/dL 8.21  8.17  8.06   Sodium 135 - 145 mmol/L 142  141  143   Potassium 3.5 - 5.1 mmol/L 3.5  3.8  4.0   Chloride 98 - 111 mmol/L 101  103  106   CO2 22 - 32 mmol/L 31  30  27    Calcium  8.9 - 10.3 mg/dL 8.7  8.7  8.9     ECHOCARDIOGRAM COMPLETE Result Date: 02/08/2024    ECHOCARDIOGRAM REPORT   Patient Name:   Rodney Oneal Date of Exam: 02/08/2024 Medical Rec #:  [id number]      Height:       71.0 in Accession #:    [id number]     Weight:       271.6 lb Date of Birth:  [birth year]       BSA:          2.401 m Patient Age:    68 years       BP:           131/62 mmHg Patient Gender: M              HR:           55 bpm. Exam Location:  [facility] Procedure: 2D Echo, Cardiac Doppler, Color Doppler and Intracardiac            Opacification Agent (Both Spectral and Color Flow Doppler were            utilized during procedure). Indications:     CHF-Acute Systolic I50.21  History:         Patient has no prior history of Echocardiogram examinations.  Sonographer:      Ashley McNeely-Sloane Referring Phys:  [id number] MARIEN LITTIE PIETY Diagnosing Phys: Evalene Lunger MD IMPRESSIONS  1. Left ventricular ejection fraction, by estimation, is 60 to 65%. Left ventricular ejection fraction by 2D MOD biplane is 62.9 %.  The left ventricle has normal function. The left ventricle has no regional wall motion abnormalities. There is mild left ventricular hypertrophy. Left ventricular diastolic parameters were normal.  2. Right ventricular systolic function is normal. The right ventricular size is normal. There is normal pulmonary artery systolic pressure. The estimated right ventricular systolic pressure is 28.0 mmHg.  3. Left atrial size was mild to moderately dilated.  4. Right atrial size was mildly dilated.  5. Moderate pericardial effusion,predominantly off the RV free wall (3.6 cm), 1.5 cm off the LV free wall. There is no evidence of cardiac tamponade.  6. The mitral valve is normal in structure. Mild to moderate mitral valve regurgitation. No evidence of mitral stenosis.  7. The aortic valve is calcified. There is moderate calcification of the aortic valve. Aortic valve regurgitation is not visualized. Moderate aortic valve stenosis.  8. The inferior vena cava is dilated in size with <50% respiratory variability, suggesting right atrial pressure of 15 mmHg. FINDINGS  Left Ventricle: Left ventricular ejection fraction, by estimation, is 60 to 65%. Left ventricular ejection fraction by 2D MOD biplane is 62.9 %. The left ventricle has normal function. The left ventricle has no regional wall motion abnormalities. Definity  contrast agent was given IV to delineate the left ventricular endocardial borders. Strain was performed and the global longitudinal strain is indeterminate. The left ventricular internal cavity size was normal in size. There is mild left ventricular hypertrophy. Left ventricular diastolic parameters were normal. Right Ventricle: The right ventricular size is normal. No  increase in right ventricular wall thickness. Right ventricular systolic function is normal. There is normal pulmonary artery systolic pressure. The tricuspid regurgitant velocity is 1.80 m/s, and  with an assumed right atrial pressure of 15 mmHg, the estimated right ventricular systolic pressure is 28.0 mmHg. Left Atrium: Left atrial size was mild to moderately dilated. Right Atrium: Right atrial size was mildly dilated. Pericardium: A moderately sized pericardial effusion is present. There is no evidence of cardiac tamponade. Mitral Valve: The mitral valve is normal in structure. Mild mitral annular calcification. Mild to moderate mitral valve regurgitation. No evidence of mitral valve stenosis. MV peak gradient, 9.2 mmHg. The mean mitral valve gradient is 2.0 mmHg. Tricuspid Valve: The tricuspid valve is normal in structure. Tricuspid valve regurgitation is not demonstrated. No evidence of tricuspid stenosis. Aortic Valve: The aortic valve is calcified. There is moderate calcification of the aortic valve. Aortic valve regurgitation is not visualized. Moderate aortic stenosis is present. Aortic valve mean gradient measures 19.2 mmHg. Aortic valve peak gradient  measures 35.2 mmHg. Aortic valve area, by VTI measures 1.55 cm. Pulmonic Valve: The pulmonic valve was normal in structure. Pulmonic valve regurgitation is not visualized. No evidence of pulmonic stenosis. Aorta: The aortic root is normal in size and structure. Venous: The inferior vena cava is dilated in size with less than 50% respiratory variability, suggesting right atrial pressure of 15 mmHg. IAS/Shunts: No atrial level shunt detected by color flow Doppler. Additional Comments: 3D was performed not requiring image post processing on an independent workstation and was indeterminate.  LEFT VENTRICLE PLAX 2D                        Biplane EF (MOD) LVIDd:         6.10 cm         LV Biplane EF:   Left LVIDs:         2.90 cm  ventricular LV PW:         1.80 cm                          ejection LV IVS:        1.30 cm                          fraction by LVOT diam:     2.40 cm                          2D MOD LV SV:         106                              biplane is LV SV Index:   44                               62.9 %. LVOT Area:     4.52 cm                                Diastology                                LV e' medial:    6.64 cm/s LV Volumes (MOD)               LV E/e' medial:  20.6 LV vol d, MOD    107.0 ml      LV e' lateral:   10.20 cm/s A2C:                           LV E/e' lateral: 13.4 LV vol d, MOD    119.0 ml A4C: LV vol s, MOD    41.6 ml A2C: LV vol s, MOD    42.8 ml A4C: LV SV MOD A2C:   65.4 ml LV SV MOD A4C:   119.0 ml LV SV MOD BP:    74.9 ml RIGHT VENTRICLE            IVC RV Basal diam:  4.90 cm    IVC diam: 2.90 cm RV S prime:     9.79 cm/s TAPSE (M-mode): 1.5 cm LEFT ATRIUM              Index        RIGHT ATRIUM           Index LA diam:        5.00 cm  2.08 cm/m   RA Area:     27.20 cm LA Vol (A2C):   103.0 ml 42.89 ml/m  RA Volume:   93.00 ml  38.73 ml/m LA Vol (A4C):   85.8 ml  35.73 ml/m LA Biplane Vol: 95.2 ml  39.64 ml/m  AORTIC VALVE                     PULMONIC VALVE AV Area (Vmax):    1.47 cm      PV Vmax:        1.34 m/s AV Area (Vmean):   1.40 cm      PV Vmean:  87.000 cm/s AV Area (VTI):     1.55 cm      PV VTI:         0.308 m AV Vmax:           296.60 cm/s   PV Peak grad:   7.2 mmHg AV Vmean:          201.800 cm/s  PV Mean grad:   4.0 mmHg AV VTI:            0.684 m       RVOT Peak grad: 1 mmHg AV Peak Grad:      35.2 mmHg AV Mean Grad:      19.2 mmHg LVOT Vmax:         96.45 cm/s LVOT Vmean:        62.350 cm/s LVOT VTI:          0.234 m LVOT/AV VTI ratio: 0.34  AORTA Ao Root diam: 3.30 cm Ao Asc diam:  2.80 cm MITRAL VALVE                TRICUSPID VALVE MV Area (PHT): 4.80 cm     TR Peak grad:   13.0 mmHg MV Area VTI:   2.38 cm     TR Mean grad:   10.0 mmHg MV Peak grad:  9.2 mmHg      TR Vmax:        180.00 cm/s MV Mean grad:  2.0 mmHg     TR Vmean:       154.0 cm/s MV Vmax:       1.52 m/s MV Vmean:      64.5 cm/s    SHUNTS MV Decel Time: 158 msec     Systemic VTI:  0.23 m MV E velocity: 137.00 cm/s  Systemic Diam: 2.40 cm MV A velocity: 38.30 cm/s   Pulmonic VTI:  0.120 m MV E/A ratio:  3.58 Evalene Lunger MD Electronically signed by Evalene Lunger MD Signature Date/Time: 02/08/2024/2:55:19 PM    Final    US  Paracentesis Result Date: 02/07/2024 INDICATION: 68 year old male with a history of heart failure, CKD stage 3, was sent to the ED by his cardiologist due to fluid overload. Found to have abdominal distension and imaging significant for ascites. Request for diagnostic and therapeutic paracentesis. EXAM: ULTRASOUND GUIDED diagnostic and therapeutic, left-sided PARACENTESIS MEDICATIONS: 1% lidocaine , 8 mL. COMPLICATIONS: None immediate. PROCEDURE: Informed written consent was obtained from the patient after a discussion of the risks, benefits and alternatives to treatment. A timeout was performed prior to the initiation of the procedure. Initial ultrasound scanning demonstrates a large amount of ascites within the left lower abdominal quadrant. The left lower abdomen was prepped and draped in the usual sterile fashion. 1% lidocaine  was used for local anesthesia. Following this, a 19 gauge, 12-cm, Yueh catheter was introduced. An ultrasound image was saved for documentation purposes. The paracentesis was performed. The catheter was removed and a dressing was applied. The patient tolerated the procedure well without immediate post procedural complication. FINDINGS: A total of approximately 2.4 L of amber fluid was removed. Samples were sent to the laboratory as requested by the clinical team. IMPRESSION: Successful ultrasound-guided paracentesis yielding 2.4 liters of peritoneal fluid. Procedure performed by: Sherrilee Bal, PA-C under the supervision of Dr. VEAR Lent Electronically  Signed   By: Wilkie Lent M.D.   On: 02/07/2024 16:56   Disposition Plan & Communication  Patient status: Inpatient  Admitted From: Home Planned disposition location: Home Anticipated  discharge date: 9/12 pending clinical improvement, continued IV diuretics  Family Communication: none    Author: Marien LITTIE Piety, DO [facility] 02/09/2024, 7:41 AM   Available by Epic secure chat 7AM-7PM. If 7PM-7AM, please contact night-coverage.  [facility] contact information found on [email or web address].

## 2024-02-09 NOTE — TOC Transition Note (Signed)
 Transition of Care [facility]) - Discharge Note   Patient Details  Name: Rodney Oneal MRN: [id number] Date of Birth: [birth year]  Transition of [facility]) CM/SW Contact:  Victory Jackquline RAMAN, RN Phone Number: 02/09/2024, 10:36 AM   Clinical Narrative:    RNCM signing off. Pt has discharge orders for today. Wife picking him up with O2 for the ride home. No further concerns.  Final next level of care: Home/Self Care Barriers to Discharge: No Barriers Identified   Patient Goals and CMS Choice            Discharge Placement                Patient to be transferred to facility by: Wife   Patient and family notified of of transfer: 02/09/24  Discharge Plan and Services Additional resources added to the After Visit Summary for       Post Acute Care Choice: NA                               Social Drivers of Health (SDOH) Interventions SDOH Screenings   Food Insecurity: No Food Insecurity (02/07/2024)  Housing: Low Risk  (02/07/2024)  Transportation Needs: No Transportation Needs (02/07/2024)  Utilities: Not At Risk (02/07/2024)  Financial Resource Strain: Low Risk  (10/18/2023)   Received from [facility]  Social Connections: Moderately Isolated (02/07/2024)  Tobacco Use: Medium Risk (02/06/2024)     Readmission Risk Interventions    02/08/2024   11:26 AM  Readmission Risk Prevention Plan  Transportation Screening Complete  PCP or Specialist Appt within 3-5 Days Complete  Social Work Consult for Recovery Care Planning/Counseling Complete  Palliative Care Screening Not Applicable  Medication Review Oceanographer) Complete

## 2024-02-09 NOTE — Discharge Summary (Signed)
 Physician Discharge Summary  Patient: Rodney Oneal FMW:[id number] DOB: 1955/09/10   Code Status: Full Code Admit date: 02/06/2024 Discharge date: 02/09/2024 Disposition: Home, No home health services recommended PCP: Don Lauraine Collar, NP  Recommendations for Outpatient Follow-up:  Follow up with PCP within 1-2 weeks Regarding general hospital follow up and preventative care Recommend BMP, CBC Follow up with cardiology   Discharge Diagnoses:  Principal Problem:   CHF (congestive heart failure) (HCC) Active Problems:   A-fib (HCC)   Obesity, Class III, BMI 40-49.9 (morbid obesity)   Hypervolemia   Bradycardia  Brief Hospital Course Summary: Rodney Oneal is a 68 y.o. male with a PMH significant for chronic HFpEF, PAF on Eliquis , HTN, HLD, IDDM, morbid obesity, OSAS on CPAP, CKD stage IIIb, sent from cardiology for failed outpatient management of CHF for IV diuresis.   ED Course: Afebrile, bradycardia heart rate in the 45-47, afebrile, blood pressure 153/68, Baseline on 3Lnc and is currently on 4L.  chest x-ray showed cardiomegaly, blood work showed creatinine 1.9 compared to baseline 1.4-2.0, potassium 3.7 BUN 40 glucose 146 albumin 3.3. BNP 538.6. Abdominal ultrasound revealed diffuse ascites. IR was consulted- s/p paracentesis, removed 2.4L  Patient was given IV Lasix  60 mg and metolazone  1 in the ED.  Received additional day of IV diuretics to further optimize his fluid status.  Renal function tolerated well and swelling improved. He was able to wean back to his home 3L without dyspnea on exertion.  Echo done did not show significant changes from prior.  He was discharged with daily diuretics.   Additionally, patient was found to be asymptomatically bradycardic in 40s and 50s HR with h/o afib and hypertensive. Medications were modified by cardiology as listed below.   All other chronic conditions were treated with home medications.    Discharge Condition: Good,  improved Recommended discharge diet: Regular healthy diet  Consultations: Cardiology   Procedures/Studies: Echo   Allergies as of 02/09/2024       Reactions   Sulfa Antibiotics Anaphylaxis, Itching, Rash        Medication List     STOP taking these medications    amiodarone  200 MG tablet Commonly known as: PACERONE    ascorbic acid  500 MG tablet Commonly known as: VITAMIN C    carvedilol  6.25 MG tablet Commonly known as: COREG    diltiazem 240 MG 24 hr capsule Commonly known as: CARDIZEM CD   insulin  lispro 100 UNIT/ML injection Commonly known as: HUMALOG   lisinopril  20 MG tablet Commonly known as: ZESTRIL    metFORMIN 1000 MG tablet Commonly known as: GLUCOPHAGE   montelukast  10 MG tablet Commonly known as: SINGULAIR    warfarin 3 MG tablet Commonly known as: COUMADIN   zinc gluconate 50 MG tablet       TAKE these medications    acetaminophen  500 MG tablet Commonly known as: TYLENOL  Take 1,000 mg by mouth every 6 (six) hours as needed for mild pain (pain score 1-3).   albuterol 108 (90 Base) MCG/ACT inhaler Commonly known as: VENTOLIN HFA Inhale 2 puffs into the lungs every 6 (six) hours as needed. for Wheezing for up to 90 days   apixaban  5 MG Tabs tablet Commonly known as: ELIQUIS  Take 5 mg by mouth.   atorvastatin  40 MG tablet Commonly known as: LIPITOR  Take 40 mg by mouth daily.   busPIRone 5 MG tablet Commonly known as: BUSPAR Take 5 mg by mouth 2 (two) times daily.   cyanocobalamin  1000 MCG tablet Commonly known as: VITAMIN  B12 Take 1,000 mcg by mouth in the morning.   empagliflozin  10 MG Tabs tablet Commonly known as: JARDIANCE  Take 10 mg by mouth.   FLUoxetine  40 MG capsule Commonly known as: PROZAC  Take 40 mg by mouth in the morning.   fluticasone  50 MCG/ACT nasal spray Commonly known as: FLONASE  Place 2 sprays into both nostrils daily.   fluticasone -salmeterol 250-50 MCG/ACT Aepb Commonly known as: ADVAIR Inhale 1  puff into the lungs in the morning and at bedtime.   insulin  degludec 200 UNIT/ML FlexTouch Pen Commonly known as: TRESIBA  Inject 14 Units into the skin daily.   ipratropium-albuterol 0.5-2.5 (3) MG/3ML Soln Commonly known as: DUONEB Inhale 3 mLs into the lungs 4 (four) times daily.   linagliptin  5 MG Tabs tablet Commonly known as: TRADJENTA  Take 1 tablet (5 mg total) by mouth daily.   Magnesium Oxide 250 MG Tabs Take 250 mg by mouth in the morning.   sacubitril -valsartan  24-26 MG Commonly known as: ENTRESTO  Take 1 tablet by mouth 2 (two) times daily.   spironolactone  25 MG tablet Commonly known as: ALDACTONE  Take 1 tablet (25 mg total) by mouth daily.   tamsulosin  0.4 MG Caps capsule Commonly known as: FLOMAX  Take 0.4 mg by mouth at bedtime.   torsemide  20 MG tablet Commonly known as: DEMADEX  Take 1 tablet (20 mg total) by mouth daily. What changed: when to take this   Vitamin D3 50 MCG (2000 UT) capsule Take 2,000 Units by mouth in the morning.        Follow-up Information     Diamond, Caralyn, PA-C. Go in 1 week(s).   Specialty: Cardiology Why: [facility] on 02/13/2024 at 10:30 AM Contact information: [address] [place] [address] [phone]                Subjective   Pt reports feeling well. Leg swelling has improved. Denies SOB nor chest pain.  All questions and concerns were addressed at time of discharge.  Objective  Blood pressure (!) 152/70, pulse (!) 53, temperature 97.9 F (36.6 C), resp. rate 18, height 5' 11 (1.803 m), weight 122.9 kg, SpO2 94%.   General: Pt is alert, awake, not in acute distress Cardiovascular: RRR, S1/S2 +, no rubs, no gallops Respiratory: CTA bilaterally, no wheezing, no rhonchi Abdominal: obese, Soft, NT, ND, bowel sounds + Extremities: chronic edema, no cyanosis. Bandages in place  The results of significant diagnostics from this hospitalization (including imaging,  microbiology, ancillary and laboratory) are listed below for reference.   Imaging studies: ECHOCARDIOGRAM COMPLETE Result Date: 02/08/2024    ECHOCARDIOGRAM REPORT   Patient Name:   Rodney Oneal Date of Exam: 02/08/2024 Medical Rec #:  [id number]      Height:       71.0 in Accession #:    [id number]     Weight:       271.6 lb Date of Birth:  [birth year]       BSA:          2.401 m Patient Age:    68 years       BP:           131/62 mmHg Patient Gender: M              HR:           55 bpm. Exam Location:  [facility] Procedure: 2D Echo, Cardiac Doppler, Color Doppler and Intracardiac  Opacification Agent (Both Spectral and Color Flow Doppler were            utilized during procedure). Indications:     CHF-Acute Systolic I50.21  History:         Patient has no prior history of Echocardiogram examinations.  Sonographer:     Ashley McNeely-Sloane Referring Phys:  [id number] MARIEN LITTIE PIETY Diagnosing Phys: Evalene Lunger MD IMPRESSIONS  1. Left ventricular ejection fraction, by estimation, is 60 to 65%. Left ventricular ejection fraction by 2D MOD biplane is 62.9 %. The left ventricle has normal function. The left ventricle has no regional wall motion abnormalities. There is mild left ventricular hypertrophy. Left ventricular diastolic parameters were normal.  2. Right ventricular systolic function is normal. The right ventricular size is normal. There is normal pulmonary artery systolic pressure. The estimated right ventricular systolic pressure is 28.0 mmHg.  3. Left atrial size was mild to moderately dilated.  4. Right atrial size was mildly dilated.  5. Moderate pericardial effusion,predominantly off the RV free wall (3.6 cm), 1.5 cm off the LV free wall. There is no evidence of cardiac tamponade.  6. The mitral valve is normal in structure. Mild to moderate mitral valve regurgitation. No evidence of mitral stenosis.  7. The aortic valve is calcified. There is moderate calcification of the aortic valve. Aortic  valve regurgitation is not visualized. Moderate aortic valve stenosis.  8. The inferior vena cava is dilated in size with <50% respiratory variability, suggesting right atrial pressure of 15 mmHg. FINDINGS  Left Ventricle: Left ventricular ejection fraction, by estimation, is 60 to 65%. Left ventricular ejection fraction by 2D MOD biplane is 62.9 %. The left ventricle has normal function. The left ventricle has no regional wall motion abnormalities. Definity  contrast agent was given IV to delineate the left ventricular endocardial borders. Strain was performed and the global longitudinal strain is indeterminate. The left ventricular internal cavity size was normal in size. There is mild left ventricular hypertrophy. Left ventricular diastolic parameters were normal. Right Ventricle: The right ventricular size is normal. No increase in right ventricular wall thickness. Right ventricular systolic function is normal. There is normal pulmonary artery systolic pressure. The tricuspid regurgitant velocity is 1.80 m/s, and  with an assumed right atrial pressure of 15 mmHg, the estimated right ventricular systolic pressure is 28.0 mmHg. Left Atrium: Left atrial size was mild to moderately dilated. Right Atrium: Right atrial size was mildly dilated. Pericardium: A moderately sized pericardial effusion is present. There is no evidence of cardiac tamponade. Mitral Valve: The mitral valve is normal in structure. Mild mitral annular calcification. Mild to moderate mitral valve regurgitation. No evidence of mitral valve stenosis. MV peak gradient, 9.2 mmHg. The mean mitral valve gradient is 2.0 mmHg. Tricuspid Valve: The tricuspid valve is normal in structure. Tricuspid valve regurgitation is not demonstrated. No evidence of tricuspid stenosis. Aortic Valve: The aortic valve is calcified. There is moderate calcification of the aortic valve. Aortic valve regurgitation is not visualized. Moderate aortic stenosis is present. Aortic  valve mean gradient measures 19.2 mmHg. Aortic valve peak gradient  measures 35.2 mmHg. Aortic valve area, by VTI measures 1.55 cm. Pulmonic Valve: The pulmonic valve was normal in structure. Pulmonic valve regurgitation is not visualized. No evidence of pulmonic stenosis. Aorta: The aortic root is normal in size and structure. Venous: The inferior vena cava is dilated in size with less than 50% respiratory variability, suggesting right atrial pressure of 15 mmHg. IAS/Shunts: No atrial level shunt detected  by color flow Doppler. Additional Comments: 3D was performed not requiring image post processing on an independent workstation and was indeterminate.  LEFT VENTRICLE PLAX 2D                        Biplane EF (MOD) LVIDd:         6.10 cm         LV Biplane EF:   Left LVIDs:         2.90 cm                          ventricular LV PW:         1.80 cm                          ejection LV IVS:        1.30 cm                          fraction by LVOT diam:     2.40 cm                          2D MOD LV SV:         106                              biplane is LV SV Index:   44                               62.9 %. LVOT Area:     4.52 cm                                Diastology                                LV e' medial:    6.64 cm/s LV Volumes (MOD)               LV E/e' medial:  20.6 LV vol d, MOD    107.0 ml      LV e' lateral:   10.20 cm/s A2C:                           LV E/e' lateral: 13.4 LV vol d, MOD    119.0 ml A4C: LV vol s, MOD    41.6 ml A2C: LV vol s, MOD    42.8 ml A4C: LV SV MOD A2C:   65.4 ml LV SV MOD A4C:   119.0 ml LV SV MOD BP:    74.9 ml RIGHT VENTRICLE            IVC RV Basal diam:  4.90 cm    IVC diam: 2.90 cm RV S prime:     9.79 cm/s TAPSE (M-mode): 1.5 cm LEFT ATRIUM              Index        RIGHT ATRIUM           Index LA diam:        5.00 cm  2.08 cm/m   RA Area:     27.20 cm LA Vol (A2C):   103.0 ml 42.89 ml/m  RA Volume:   93.00 ml  38.73 ml/m LA Vol (A4C):   85.8 ml  35.73 ml/m LA  Biplane Vol: 95.2 ml  39.64 ml/m  AORTIC VALVE                     PULMONIC VALVE AV Area (Vmax):    1.47 cm      PV Vmax:        1.34 m/s AV Area (Vmean):   1.40 cm      PV Vmean:       87.000 cm/s AV Area (VTI):     1.55 cm      PV VTI:         0.308 m AV Vmax:           296.60 cm/s   PV Peak grad:   7.2 mmHg AV Vmean:          201.800 cm/s  PV Mean grad:   4.0 mmHg AV VTI:            0.684 m       RVOT Peak grad: 1 mmHg AV Peak Grad:      35.2 mmHg AV Mean Grad:      19.2 mmHg LVOT Vmax:         96.45 cm/s LVOT Vmean:        62.350 cm/s LVOT VTI:          0.234 m LVOT/AV VTI ratio: 0.34  AORTA Ao Root diam: 3.30 cm Ao Asc diam:  2.80 cm MITRAL VALVE                TRICUSPID VALVE MV Area (PHT): 4.80 cm     TR Peak grad:   13.0 mmHg MV Area VTI:   2.38 cm     TR Mean grad:   10.0 mmHg MV Peak grad:  9.2 mmHg     TR Vmax:        180.00 cm/s MV Mean grad:  2.0 mmHg     TR Vmean:       154.0 cm/s MV Vmax:       1.52 m/s MV Vmean:      64.5 cm/s    SHUNTS MV Decel Time: 158 msec     Systemic VTI:  0.23 m MV E velocity: 137.00 cm/s  Systemic Diam: 2.40 cm MV A velocity: 38.30 cm/s   Pulmonic VTI:  0.120 m MV E/A ratio:  3.58 Evalene Lunger MD Electronically signed by Evalene Lunger MD Signature Date/Time: 02/08/2024/2:55:19 PM    Final    US  Paracentesis Result Date: 02/07/2024 INDICATION: 68 year old male with a history of heart failure, CKD stage 3, was sent to the ED by his cardiologist due to fluid overload. Found to have abdominal distension and imaging significant for ascites. Request for diagnostic and therapeutic paracentesis. EXAM: ULTRASOUND GUIDED diagnostic and therapeutic, left-sided PARACENTESIS MEDICATIONS: 1% lidocaine , 8 mL. COMPLICATIONS: None immediate. PROCEDURE: Informed written consent was obtained from the patient after a discussion of the risks, benefits and alternatives to treatment. A timeout was performed prior to the initiation of the procedure. Initial ultrasound scanning  demonstrates a large amount of ascites within the left lower abdominal quadrant. The left lower abdomen was prepped and draped in the usual sterile fashion. 1% lidocaine  was used for local anesthesia. Following this, a 20  gauge, 12-cm, Yueh catheter was introduced. An ultrasound image was saved for documentation purposes. The paracentesis was performed. The catheter was removed and a dressing was applied. The patient tolerated the procedure well without immediate post procedural complication. FINDINGS: A total of approximately 2.4 L of amber fluid was removed. Samples were sent to the laboratory as requested by the clinical team. IMPRESSION: Successful ultrasound-guided paracentesis yielding 2.4 liters of peritoneal fluid. Procedure performed by: Sherrilee Bal, PA-C under the supervision of Dr. VEAR Lent Electronically Signed   By: Wilkie Lent M.D.   On: 02/07/2024 16:56   US  ASCITES (ABDOMEN LIMITED) Result Date: 02/06/2024 CLINICAL DATA:  Abdominal distension EXAM: LIMITED ABDOMEN ULTRASOUND FOR ASCITES TECHNIQUE: Limited ultrasound survey for ascites was performed in all four abdominal quadrants. COMPARISON:  07/12/2013 FINDINGS: 4 quadrant ultrasound was performed. There is diffuse ascites, most pronounced within the lower abdomen. IMPRESSION: 1. Diffuse abdominal ascites. Electronically Signed   By: Ozell Daring M.D.   On: 02/06/2024 17:40   DG Chest 2 View Result Date: 02/06/2024 EXAM: 2 VIEW(S) XRAY OF THE CHEST 02/06/2024 01:45:00 PM COMPARISON: 09/11/2023 CLINICAL HISTORY: Sent for fluid overload. Bilateral LE swelling since about 1 week. Legs are wrapped--wrapped this AM. Pt wears 3L at baseline since 11/28/23. FINDINGS: LUNGS AND PLEURA: Bilateral prominent interstitial markings. Possible trace bilateral pleural effusions. HEART AND MEDIASTINUM: Cardiomegaly, stable. BONES AND SOFT TISSUES: No acute osseous abnormality. IMPRESSION: 1. Cardiomegaly, stable. 2. Bilateral prominent  interstitial markings and possible trace bilateral pleural effusions, consistent with fluid overload. Electronically signed by: Waddell Calk MD 02/06/2024 02:26 PM EDT RP Workstation: [id number]   CT CHEST W CONTRAST Result Date: 01/25/2024 CLINICAL DATA:  OSA, COPD EXAM: CT CHEST WITH CONTRAST TECHNIQUE: Multidetector CT imaging of the chest was performed during intravenous contrast administration. RADIATION DOSE REDUCTION: This exam was performed according to the departmental dose-optimization program which includes automated exposure control, adjustment of the mA and/or kV according to patient size and/or use of iterative reconstruction technique. CONTRAST:  75mL OMNIPAQUE  IOHEXOL  300 MG/ML  SOLN COMPARISON:  09/05/2023 FINDINGS: Cardiovascular: Aortic atherosclerosis. Cardiomegaly. Three-vessel coronary artery calcifications. Small pericardial effusion. Mediastinum/Nodes: Unchanged prominent mediastinal and hilar lymph nodes. Thyroid gland, trachea, and esophagus demonstrate no significant findings. Lungs/Pleura: Small bilateral pleural effusions. Diffuse interlobular septal thickening. Underlying pulmonary fibrosis in a pattern with apical to basal gradient, featuring irregular peripheral interstitial opacity, septal thickening, traction bronchiectasis, and areas of subpleural bronchiolectasis of the lung bases without clear evidence of honeycombing. Upper Abdomen: Small volume ascites. Cholelithiasis. Unchanged benign left adrenal adenoma requiring no specific further follow-up or characterization. Musculoskeletal: No chest wall abnormality. No acute osseous findings. IMPRESSION: 1. Pulmonary fibrosis in a pattern with apical to basal gradient, featuring irregular peripheral interstitial opacity, septal thickening, traction bronchiectasis, and areas of subpleural bronchiolectasis of the lung bases without clear evidence of honeycombing. 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 2. Small  bilateral pleural effusions. Diffuse interlobular septal thickening, consistent with pulmonary edema. 3. Unchanged prominent mediastinal and hilar lymph nodes, likely reactive. 4. Cardiomegaly and coronary artery disease. 5. Small volume ascites. 6. Cholelithiasis. Aortic Atherosclerosis (ICD10-I70.0). Electronically Signed   By: Marolyn JONETTA Jaksch M.D.   On: 01/25/2024 14:28    Labs: Basic Metabolic Panel: Recent Labs  Lab 02/06/24 1326 02/07/24 0500 02/08/24 0338 02/09/24 0358  NA 141 143 141 142  K 3.7 4.0 3.8 3.5  CL 105 106 103 101  CO2 26 27 30 31   GLUCOSE 146* 137* 96 93  BUN 40*  37* 35* 34*  CREATININE 1.95* 1.93* 1.82* 1.78*  CALCIUM  8.9 8.9 8.7* 8.7*   CBC: Recent Labs  Lab 02/06/24 1326  WBC 5.3  HGB 9.4*  HCT 32.3*  MCV 94.7  PLT 163   Microbiology: Results for orders placed or performed during the hospital encounter of 09/11/23  Resp panel by RT-PCR (RSV, Flu A&B, Covid) Anterior Nasal Swab     Status: None   Collection Time: 09/11/23  4:11 PM   Specimen: Anterior Nasal Swab  Result Value Ref Range Status   SARS Coronavirus 2 by RT PCR NEGATIVE NEGATIVE Final    Comment: (NOTE) SARS-CoV-2 target nucleic acids are NOT DETECTED.  The SARS-CoV-2 RNA is generally detectable in upper respiratory specimens during the acute phase of infection. The lowest concentration of SARS-CoV-2 viral copies this assay can detect is 138 copies/mL. A negative result does not preclude SARS-Cov-2 infection and should not be used as the sole basis for treatment or other patient management decisions. A negative result may occur with  improper specimen collection/handling, submission of specimen other than nasopharyngeal swab, presence of viral mutation(s) within the areas targeted by this assay, and inadequate number of viral copies(<138 copies/mL). A negative result must be combined with clinical observations, patient history, and epidemiological information. The expected result is  Negative.  Fact Sheet for Patients:  BloggerCourse.com  Fact Sheet for Healthcare Providers:  SeriousBroker.it  This test is no t yet approved or cleared by the [place]  FDA and  has been authorized for detection and/or diagnosis of SARS-CoV-2 by FDA under an Emergency Use Authorization (EUA). This EUA will remain  in effect (meaning this test can be used) for the duration of the COVID-19 declaration under Section 564(b)(1) of the Act, 21 U.S.C.section 360bbb-3(b)(1), unless the authorization is terminated  or revoked sooner.       Influenza A by PCR NEGATIVE NEGATIVE Final   Influenza B by PCR NEGATIVE NEGATIVE Final    Comment: (NOTE) The Xpert Xpress SARS-CoV-2/FLU/RSV plus assay is intended as an aid in the diagnosis of influenza from Nasopharyngeal swab specimens and should not be used as a sole basis for treatment. Nasal washings and aspirates are unacceptable for Xpert Xpress SARS-CoV-2/FLU/RSV testing.  Fact Sheet for Patients: BloggerCourse.com  Fact Sheet for Healthcare Providers: SeriousBroker.it  This test is not yet approved or cleared by the [place]  FDA and has been authorized for detection and/or diagnosis of SARS-CoV-2 by FDA under an Emergency Use Authorization (EUA). This EUA will remain in effect (meaning this test can be used) for the duration of the COVID-19 declaration under Section 564(b)(1) of the Act, 21 U.S.C. section 360bbb-3(b)(1), unless the authorization is terminated or revoked.     Resp Syncytial Virus by PCR NEGATIVE NEGATIVE Final    Comment: (NOTE) Fact Sheet for Patients: BloggerCourse.com  Fact Sheet for Healthcare Providers: SeriousBroker.it  This test is not yet approved or cleared by the [place]  FDA and has been authorized for detection and/or diagnosis of  SARS-CoV-2 by FDA under an Emergency Use Authorization (EUA). This EUA will remain in effect (meaning this test can be used) for the duration of the COVID-19 declaration under Section 564(b)(1) of the Act, 21 U.S.C. section 360bbb-3(b)(1), unless the authorization is terminated or revoked.  Performed at [facility], [address] [place], [place] [address]     Time coordinating discharge: Over 30 minutes  Marien LITTIE Piety, MD  [facility] 02/09/2024, 9:16 AM

## 2024-02-09 NOTE — Plan of Care (Signed)
   Problem: Nutritional: Goal: Maintenance of adequate nutrition will improve Outcome: Progressing

## 2024-02-09 NOTE — Discharge Instructions (Addendum)
 Please review your medications carefully as there have been several changes.  We have added two new blood pressure medications which will ultimately help your heart failure from worsening Please take your torsemide  every day for your fluid and can decrease it if you feel any lightheadedness with standing on that dose We have stopped the medications that lower your heart rate since your heart rate is already low/normal.  Follow up with cardiology in about a week to monitor how these changes are going

## 2024-02-09 NOTE — Progress Notes (Signed)
 Patient and wife at bedside, discharge instructions given and PIV removed. Patient expressed understanding and wheeled to car where patient put on his portable O2.

## 2024-02-09 NOTE — Progress Notes (Signed)
 Heart Failure Stewardship Pharmacy Note  PCP: Don Lauraine Collar, NP PCP-Cardiologist: None  HPI: Rodney Oneal is a 68 y.o. male with chronic HFpEF, PAF on Eliquis  s/p ablation, HTN, HLD, IDDM, morbid obesity, OSA on CPAP, CKD stage IIIb who presented with lower extremity edema. On admission, BNP was 538.6, HS-troponin was 15. Chest x-ray noted to be consistent with fluid overload. Abdominal US  noted diffuse abdominal ascites s/p 2.4L paracentesis.  Pertinent cardiac history: History of HFpEF and AF. Underwent ablation for AF 07/2023. Repeat TTE ordered.  Pertinent Lab Values: Creatinine, Ser  Date Value Ref Range Status  02/09/2024 1.78 (H) 0.61 - 1.24 mg/dL Final   BUN  Date Value Ref Range Status  02/09/2024 34 (H) 8 - 23 mg/dL Final   Potassium  Date Value Ref Range Status  02/09/2024 3.5 3.5 - 5.1 mmol/L Final   Sodium  Date Value Ref Range Status  02/09/2024 142 135 - 145 mmol/L Final   B Natriuretic Peptide  Date Value Ref Range Status  02/06/2024 538.6 (H) 0.0 - 100.0 pg/mL Final    Comment:    Performed at [facility], [address] [place], [place] [address]   Magnesium  Date Value Ref Range Status  09/11/2023 2.3 1.7 - 2.4 mg/dL Final    Comment:    Performed at [facility], [address] [place], [place] [address]   Hgb A1c MFr Bld  Date Value Ref Range Status  09/12/2023 6.1 (H) 4.8 - 5.6 % Final    Comment:    (NOTE) Pre diabetes:          5.7%-6.4%  Diabetes:              >6.4%  Glycemic control for   <7.0% adults with diabetes     Vital Signs:  Temp:  [97.9 F (36.6 C)-98.7 F (37.1 C)] 97.9 F (36.6 C) (09/12 0821) Pulse Rate:  [49-57] 53 (09/12 0821) Cardiac Rhythm: Sinus bradycardia (09/12 0932) Resp:  [18-20] 18 (09/12 0328) BP: (128-154)/(57-73) 152/70 (09/12 0821) SpO2:  [92 %-99 %] 94 % (09/12 0821) Weight:  [122.9 kg (270 lb 15.1 oz)] 122.9 kg (270 lb 15.1 oz) (09/12 0500)  Intake/Output Summary  (Last 24 hours) at 02/09/2024 1147 Last data filed at 02/09/2024 1045 Gross per 24 hour  Intake 420 ml  Output 2200 ml  Net -1780 ml    Current Heart Failure Medications:  Loop diuretic: furosemide  60 mg IV BID Beta-Blocker: none ACEI/ARB/ARNI: losartan  25 mg daily MRA: none SGLT2i: Jardiance  10 mg daily Other:  Prior to admission Heart Failure Medications:  Loop diuretic: torsemide  20 mg every other day Beta-Blocker: none ACEI/ARB/ARNI: lisinopril  20 mg daily MRA: none SGLT2i: Jardiance  10 mg daily Other: none  Assessment: 1. Acute on chronic diastolic heart failure (LVEF >55%)  , due to presumed NICM. NYHA class II symptoms.  -Symptoms: Patient reports feeling much better after the paracentesis and further diuresis. Reports improving LEE, no SOB, and no orthopnea. -Volume: Appears closer to euvolemic, though difficult to assess due to body habitus. Continue furosemide  60 mg IV this AM and patient will require daily scheduled torsemide  with an additional as needed on discharge. -Hemodynamics: BP elevated. HR 40-50s. -SGLT2i: Continue Jardiance  10 mg daily. -MRA: Spironolactone  25 mg daily started today -ARNI: Patient may benefit from Entresto  given elevated BP and hypervolemia. Consider transition to 24-26 mg BID today.  Plan: 1) Medication changes recommended at this time: -Transition to Entresto  24-26 mg BID  2)  Patient assistance: -Copays for Entresto  and Jardiance  are $0. Leonore requires prior serbia.  3) Education: - Patient has been educated on current HF medications and potential additions to HF medication regimen - Patient verbalizes understanding that over the next few months, these medication doses may change and more medications may be added to optimize HF regimen - Patient has been educated on basic disease state pathophysiology and goals of therapy  Medication Assistance / Insurance Benefits Check: Does the patient have prescription insurance?    Type of  insurance plan:  Does the patient qualify for medication assistance through manufacturers or grants? Pending   Outpatient Pharmacy: Prior to admission outpatient pharmacy: [facility]      Please do not hesitate to reach out with questions or concerns,  Jaun Bash, PharmD, CPP, BCPS, [facility] Heart Failure Pharmacist  Phone - [phone] 02/09/2024 11:47 AM

## 2024-02-09 NOTE — Progress Notes (Signed)
 [facility] CLINIC CARDIOLOGY PROGRESS NOTE       Patient ID: GEN CLAGG MRN: [id number] DOB/AGE: [birth year] 68 y.o.  Admit date: 02/06/2024 Referring Physician Dr. Lenon Primary Physician Gauger, Lauraine Collar, NP Primary Cardiologist Dr. Florencio Reason for Consultation CHF  HPI: Rodney Oneal is a 68 y.o. male  with a past medical history of paroxysmal atrial fibrillation s/p ablation ( 08/15/2023, Eliquis ), history CVA, chronic HFpEF, hypertension, hyperlipidemia, morbid obesity, type 2 diabetes, OSA (on CPAP), CKD stage IIIb, tobacco history who presented to the ED on 02/06/2024 for worsening fluid overload sent by outpatient cardiology as patient requires IV diuresis.  Patient with worsening lower extremity swelling and SOB for several weeks.  Patient reports 30 pound increased in past few weeks. Cardiology was consulted for further evaluation.   Interval History: -Patient seen and examined this AM and sitting up in hospital bed eating breakfast. Patient states he continues to feel a lot better with improvement to SOB and LEE.  And denies chest pain.  Appears near euvolemia with trace LEE. -Patients BP elevated and HR  stable this AM. Overnight Tele showed no significant events.  -Yesterday UOP 1.750L, with stable renal function.  -Patient remains on 3L with stable SpO2. (At baseline) -s/p paracentesis, removed 2.4L (09/10) -Patient very eager to go home today.   Review of systems complete and found to be negative unless listed above    Past Medical History:  Diagnosis Date   A-fib [facility])    had ablation 07/29/23   Arthritis    Asthma    BPH (benign prostatic hyperplasia)    CHF (congestive heart failure) (HCC)    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    COPD (chronic obstructive pulmonary disease) (HCC)    Depression    Diabetes mellitus without complication (HCC)    takes insulin . 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    Past Surgical History:  Procedure Laterality Date   CARDIOVERSION N/A 03/27/2023   Procedure: CARDIOVERSION;  Surgeon: Florencio Cara BIRCH, MD;  Location: [facility] ORS;  Service: Cardiovascular;  Laterality: N/A;   CARDIOVERSION N/A 04/19/2023   Procedure: CARDIOVERSION;  Surgeon: Florencio Cara BIRCH, MD;  Location: [facility] ORS;  Service: Cardiovascular;  Laterality: N/A;   CARDIOVERSION N/A 05/10/2023   Procedure: CARDIOVERSION;  Surgeon: Dewane Shiner, DO;  Location: [facility] ORS;  Service: Cardiovascular;  Laterality: N/A;   COLONOSCOPY WITH PROPOFOL      COLONOSCOPY WITH PROPOFOL  N/A 02/28/2019   Procedure: COLONOSCOPY WITH PROPOFOL ;  Surgeon: Gaylyn Gladis PENNER, MD;  Location: [facility] ENDOSCOPY;  Service: Endoscopy;  Laterality: N/A;   kidney stones removed     left rotator cuff repair     right shoulder surgery      Medications Prior to Admission  Medication Sig Dispense Refill Last Dose/Taking   acetaminophen  (TYLENOL ) 500 MG tablet Take 1,000 mg by mouth every 6 (six) hours as needed for mild pain (pain score 1-3).   Unknown   albuterol (VENTOLIN HFA) 108 (90 Base) MCG/ACT inhaler Inhale 2 puffs into the lungs every 6 (six) hours as needed. for Wheezing for up to 90 days   Unknown   apixaban  (ELIQUIS ) 5 MG TABS tablet Take 5  mg by mouth.   02/06/2024   atorvastatin  (LIPITOR ) 40 MG tablet Take 40 mg by mouth daily.   02/06/2024   busPIRone (BUSPAR) 5 MG tablet Take 5 mg by mouth 2 (two) times daily.   02/06/2024   carvedilol  (COREG ) 6.25 MG tablet Take 6.25 mg by mouth 2 (two) times daily with a meal.   02/06/2024   Cholecalciferol  (VITAMIN D3) 50 MCG (2000 UT) capsule Take 2,000 Units by mouth in the morning.   02/06/2024   cyanocobalamin  (VITAMIN B12) 1000 MCG tablet Take 1,000 mcg by mouth in the morning.   02/06/2024   diltiazem  (CARDIZEM CD) 240 MG 24 hr capsule Take 240 mg by mouth in the morning.   02/06/2024   empagliflozin  (JARDIANCE ) 10 MG TABS tablet Take 10 mg by mouth.   02/06/2024   FLUoxetine  (PROZAC ) 40 MG capsule Take 40 mg by mouth in the morning.   02/06/2024   fluticasone  (FLONASE ) 50 MCG/ACT nasal spray Place 2 sprays into both nostrils daily.   02/06/2024   fluticasone -salmeterol (ADVAIR) 250-50 MCG/ACT AEPB Inhale 1 puff into the lungs in the morning and at bedtime.   02/06/2024   insulin  degludec (TRESIBA ) 200 UNIT/ML FlexTouch Pen Inject 14 Units into the skin daily.   02/06/2024   insulin  lispro (HUMALOG) 100 UNIT/ML injection Inject 15 Units into the skin See admin instructions. Inject 14 units subcutaneously in the morning and inject 12 units subcutaneously in the evening.   02/06/2024   ipratropium-albuterol (DUONEB) 0.5-2.5 (3) MG/3ML SOLN Inhale 3 mLs into the lungs 4 (four) times daily.   02/06/2024   lisinopril  (ZESTRIL ) 20 MG tablet Take 1 tablet (20 mg total) by mouth in the morning. 30 tablet 1 02/06/2024   Magnesium Oxide 250 MG TABS Take 250 mg by mouth in the morning.   02/06/2024   tamsulosin  (FLOMAX ) 0.4 MG CAPS capsule Take 0.4 mg by mouth at bedtime.   02/05/2024   torsemide  (DEMADEX ) 20 MG tablet Take 20 mg by mouth every other day.   02/05/2024   amiodarone  (PACERONE ) 200 MG tablet Take 200 mg by mouth in the morning. (Patient not taking: Reported on 02/06/2024)   Not Taking   ascorbic acid  (VITAMIN C ) 500 MG tablet Take 500 mg by mouth in the morning. (Patient not taking: Reported on 02/06/2024)   Not Taking   metFORMIN (GLUCOPHAGE) 1000 MG tablet Take 1,000 mg by mouth 2 (two) times daily with a meal. (Patient not taking: Reported on 02/06/2024)   Not Taking   montelukast  (SINGULAIR ) 10 MG tablet Take 1 tablet (10 mg total) by mouth daily as needed (allergies/respiratory issues.). (Patient not taking: Reported on 02/06/2024)   Not Taking   warfarin (COUMADIN) 3 MG tablet Take 3 mg by mouth every morning. (Patient  not taking: Reported on 02/06/2024)   Not Taking   zinc gluconate 50 MG tablet Take 50 mg by mouth in the morning. (Patient not taking: Reported on 02/06/2024)   Not Taking   Social History   Socioeconomic History   Marital status: Married    Spouse name: Not on file   Number of children: Not on file   Years of education: Not on file   Highest education level: Not on file  Occupational History   Not on file  Tobacco Use   Smoking status: Former    Current packs/day: 0.00    Average packs/day: 1 pack/day for 37.7 years (37.7 ttl pk-yrs)    Types: Cigarettes    Start  date: 36    Quit date: 02/27/2013    Years since quitting: 10.9   Smokeless tobacco: Never  Vaping Use   Vaping status: Never Used  Substance and Sexual Activity   Alcohol use: Not Currently   Drug use: Not Currently   Sexual activity: Not Currently  Other Topics Concern   Not on file  Social History Narrative   Not on file   Social Drivers of Health   Financial Resource Strain: Low Risk  (10/18/2023)   Received from [facility]   Overall Financial Resource Strain (CARDIA)    Difficulty of Paying Living Expenses: Not hard at all  Food Insecurity: No Food Insecurity (02/07/2024)   Hunger Vital Sign    Worried About Running Out of Food in the Last Year: Never true    Ran Out of Food in the Last Year: Never true  Transportation Needs: No Transportation Needs (02/07/2024)   PRAPARE - Administrator, Civil Service (Medical): No    Lack of Transportation (Non-Medical): No  Physical Activity: Not on file  Stress: Not on file  Social Connections: Moderately Isolated (02/07/2024)   Social Connection and Isolation Panel    Frequency of Communication with Friends and Family: More than three times a week    Frequency of Social Gatherings with Friends and Family: More than three times a week    Attends Religious Services: Never    Database administrator or Organizations: No    Attends Tax inspector Meetings: Never    Marital Status: Married  Catering manager Violence: Not At Risk (02/07/2024)   Humiliation, Afraid, Rape, and Kick questionnaire    Fear of Current or Ex-Partner: No    Emotionally Abused: No    Physically Abused: No    Sexually Abused: No    Family History  Problem Relation Age of Onset   Diabetes Mother    Heart failure Mother    Heart attack Mother    Heart failure Father    Heart attack Brother    Heart failure Brother      Vitals:   02/09/24 0043 02/09/24 0328 02/09/24 0500 02/09/24 0821  BP: (!) 154/61 (!) 148/66  (!) 152/70  Pulse: (!) 54 (!) 49  (!) 53  Resp: 18 18    Temp: 98.2 F (36.8 C) 98.4 F (36.9 C)  97.9 F (36.6 C)  TempSrc: Oral Oral    SpO2: 98% 99%  94%  Weight:   122.9 kg   Height:        PHYSICAL EXAM General: Chronically ill-appearing elderly male, well nourished, in no acute distress. HEENT: Normocephalic and atraumatic. Neck: No JVD.   Lungs: Normal respiratory effort on 3L (baseline 3L).  CTAB Heart: HRRR. Normal S1 and S2 without gallops or murmurs.  Abdomen: Non-distended appearing.  Msk: Normal strength and tone for age. Extremities: Warm and well perfused. No clubbing, cyanosis. Wrapped, + trace pedal edema.  Neuro: Alert and oriented X 3. Psych: Answers questions appropriately.   Labs: Basic Metabolic Panel: Recent Labs    02/08/24 0338 02/09/24 0358  NA 141 142  K 3.8 3.5  CL 103 101  CO2 30 31  GLUCOSE 96 93  BUN 35* 34*  CREATININE 1.82* 1.78*  CALCIUM  8.7* 8.7*   Liver Function Tests: Recent Labs    02/06/24 1326  AST 16  ALT 13  ALKPHOS 74  BILITOT 1.4*  PROT 7.6  ALBUMIN 3.3*   No results  for input(s): LIPASE, AMYLASE in the last 72 hours. CBC: Recent Labs    02/06/24 1326  WBC 5.3  HGB 9.4*  HCT 32.3*  MCV 94.7  PLT 163   Cardiac Enzymes: Recent Labs    02/06/24 1326 02/06/24 1836  TROPONINIHS 15 15   BNP: Recent Labs    02/06/24 1326  BNP 538.6*    D-Dimer: No results for input(s): DDIMER in the last 72 hours. Hemoglobin A1C: No results for input(s): HGBA1C in the last 72 hours. Fasting Lipid Panel: No results for input(s): CHOL, HDL, LDLCALC, TRIG, CHOLHDL, LDLDIRECT in the last 72 hours. Thyroid Function Tests: No results for input(s): TSH, T4TOTAL, T3FREE, THYROIDAB in the last 72 hours.  Invalid input(s): FREET3 Anemia Panel: No results for input(s): VITAMINB12, FOLATE, FERRITIN, TIBC, IRON, RETICCTPCT in the last 72 hours.   Radiology: ECHOCARDIOGRAM COMPLETE Result Date: 02/08/2024    ECHOCARDIOGRAM REPORT   Patient Name:   MARICUS TANZI Date of Exam: 02/08/2024 Medical Rec #:  [id number]      Height:       71.0 in Accession #:    [id number]     Weight:       271.6 lb Date of Birth:  1956/03/09       BSA:          2.401 m Patient Age:    68 years       BP:           131/62 mmHg Patient Gender: M              HR:           55 bpm. Exam Location:  [facility] Procedure: 2D Echo, Cardiac Doppler, Color Doppler and Intracardiac            Opacification Agent (Both Spectral and Color Flow Doppler were            utilized during procedure). Indications:     CHF-Acute Systolic I50.21  History:         Patient has no prior history of Echocardiogram examinations.  Sonographer:     Ashley McNeely-Sloane Referring Phys:  [id number] MARIEN LITTIE PIETY Diagnosing Phys: Evalene Lunger MD IMPRESSIONS  1. Left ventricular ejection fraction, by estimation, is 60 to 65%. Left ventricular ejection fraction by 2D MOD biplane is 62.9 %. The left ventricle has normal function. The left ventricle has no regional wall motion abnormalities. There is mild left ventricular hypertrophy. Left ventricular diastolic parameters were normal.  2. Right ventricular systolic function is normal. The right ventricular size is normal. There is normal pulmonary artery systolic pressure. The estimated right ventricular systolic pressure is 28.0 mmHg.   3. Left atrial size was mild to moderately dilated.  4. Right atrial size was mildly dilated.  5. Moderate pericardial effusion,predominantly off the RV free wall (3.6 cm), 1.5 cm off the LV free wall. There is no evidence of cardiac tamponade.  6. The mitral valve is normal in structure. Mild to moderate mitral valve regurgitation. No evidence of mitral stenosis.  7. The aortic valve is calcified. There is moderate calcification of the aortic valve. Aortic valve regurgitation is not visualized. Moderate aortic valve stenosis.  8. The inferior vena cava is dilated in size with <50% respiratory variability, suggesting right atrial pressure of 15 mmHg. FINDINGS  Left Ventricle: Left ventricular ejection fraction, by estimation, is 60 to 65%. Left ventricular ejection fraction by 2D MOD biplane is 62.9 %. The left ventricle has normal  function. The left ventricle has no regional wall motion abnormalities. Definity  contrast agent was given IV to delineate the left ventricular endocardial borders. Strain was performed and the global longitudinal strain is indeterminate. The left ventricular internal cavity size was normal in size. There is mild left ventricular hypertrophy. Left ventricular diastolic parameters were normal. Right Ventricle: The right ventricular size is normal. No increase in right ventricular wall thickness. Right ventricular systolic function is normal. There is normal pulmonary artery systolic pressure. The tricuspid regurgitant velocity is 1.80 m/s, and  with an assumed right atrial pressure of 15 mmHg, the estimated right ventricular systolic pressure is 28.0 mmHg. Left Atrium: Left atrial size was mild to moderately dilated. Right Atrium: Right atrial size was mildly dilated. Pericardium: A moderately sized pericardial effusion is present. There is no evidence of cardiac tamponade. Mitral Valve: The mitral valve is normal in structure. Mild mitral annular calcification. Mild to moderate mitral  valve regurgitation. No evidence of mitral valve stenosis. MV peak gradient, 9.2 mmHg. The mean mitral valve gradient is 2.0 mmHg. Tricuspid Valve: The tricuspid valve is normal in structure. Tricuspid valve regurgitation is not demonstrated. No evidence of tricuspid stenosis. Aortic Valve: The aortic valve is calcified. There is moderate calcification of the aortic valve. Aortic valve regurgitation is not visualized. Moderate aortic stenosis is present. Aortic valve mean gradient measures 19.2 mmHg. Aortic valve peak gradient  measures 35.2 mmHg. Aortic valve area, by VTI measures 1.55 cm. Pulmonic Valve: The pulmonic valve was normal in structure. Pulmonic valve regurgitation is not visualized. No evidence of pulmonic stenosis. Aorta: The aortic root is normal in size and structure. Venous: The inferior vena cava is dilated in size with less than 50% respiratory variability, suggesting right atrial pressure of 15 mmHg. IAS/Shunts: No atrial level shunt detected by color flow Doppler. Additional Comments: 3D was performed not requiring image post processing on an independent workstation and was indeterminate.  LEFT VENTRICLE PLAX 2D                        Biplane EF (MOD) LVIDd:         6.10 cm         LV Biplane EF:   Left LVIDs:         2.90 cm                          ventricular LV PW:         1.80 cm                          ejection LV IVS:        1.30 cm                          fraction by LVOT diam:     2.40 cm                          2D MOD LV SV:         106                              biplane is LV SV Index:   44  62.9 %. LVOT Area:     4.52 cm                                Diastology                                LV e' medial:    6.64 cm/s LV Volumes (MOD)               LV E/e' medial:  20.6 LV vol d, MOD    107.0 ml      LV e' lateral:   10.20 cm/s A2C:                           LV E/e' lateral: 13.4 LV vol d, MOD    119.0 ml A4C: LV vol s, MOD    41.6 ml A2C: LV vol  s, MOD    42.8 ml A4C: LV SV MOD A2C:   65.4 ml LV SV MOD A4C:   119.0 ml LV SV MOD BP:    74.9 ml RIGHT VENTRICLE            IVC RV Basal diam:  4.90 cm    IVC diam: 2.90 cm RV S prime:     9.79 cm/s TAPSE (M-mode): 1.5 cm LEFT ATRIUM              Index        RIGHT ATRIUM           Index LA diam:        5.00 cm  2.08 cm/m   RA Area:     27.20 cm LA Vol (A2C):   103.0 ml 42.89 ml/m  RA Volume:   93.00 ml  38.73 ml/m LA Vol (A4C):   85.8 ml  35.73 ml/m LA Biplane Vol: 95.2 ml  39.64 ml/m  AORTIC VALVE                     PULMONIC VALVE AV Area (Vmax):    1.47 cm      PV Vmax:        1.34 m/s AV Area (Vmean):   1.40 cm      PV Vmean:       87.000 cm/s AV Area (VTI):     1.55 cm      PV VTI:         0.308 m AV Vmax:           296.60 cm/s   PV Peak grad:   7.2 mmHg AV Vmean:          201.800 cm/s  PV Mean grad:   4.0 mmHg AV VTI:            0.684 m       RVOT Peak grad: 1 mmHg AV Peak Grad:      35.2 mmHg AV Mean Grad:      19.2 mmHg LVOT Vmax:         96.45 cm/s LVOT Vmean:        62.350 cm/s LVOT VTI:          0.234 m LVOT/AV VTI ratio: 0.34  AORTA Ao Root diam: 3.30 cm Ao Asc diam:  2.80 cm MITRAL VALVE  TRICUSPID VALVE MV Area (PHT): 4.80 cm     TR Peak grad:   13.0 mmHg MV Area VTI:   2.38 cm     TR Mean grad:   10.0 mmHg MV Peak grad:  9.2 mmHg     TR Vmax:        180.00 cm/s MV Mean grad:  2.0 mmHg     TR Vmean:       154.0 cm/s MV Vmax:       1.52 m/s MV Vmean:      64.5 cm/s    SHUNTS MV Decel Time: 158 msec     Systemic VTI:  0.23 m MV E velocity: 137.00 cm/s  Systemic Diam: 2.40 cm MV A velocity: 38.30 cm/s   Pulmonic VTI:  0.120 m MV E/A ratio:  3.58 Evalene Lunger MD Electronically signed by Evalene Lunger MD Signature Date/Time: 02/08/2024/2:55:19 PM    Final    US  Paracentesis Result Date: 02/07/2024 INDICATION: 68 year old male with a history of heart failure, CKD stage 3, was sent to the ED by his cardiologist due to fluid overload. Found to have abdominal distension and  imaging significant for ascites. Request for diagnostic and therapeutic paracentesis. EXAM: ULTRASOUND GUIDED diagnostic and therapeutic, left-sided PARACENTESIS MEDICATIONS: 1% lidocaine , 8 mL. COMPLICATIONS: None immediate. PROCEDURE: Informed written consent was obtained from the patient after a discussion of the risks, benefits and alternatives to treatment. A timeout was performed prior to the initiation of the procedure. Initial ultrasound scanning demonstrates a large amount of ascites within the left lower abdominal quadrant. The left lower abdomen was prepped and draped in the usual sterile fashion. 1% lidocaine  was used for local anesthesia. Following this, a 19 gauge, 12-cm, Yueh catheter was introduced. An ultrasound image was saved for documentation purposes. The paracentesis was performed. The catheter was removed and a dressing was applied. The patient tolerated the procedure well without immediate post procedural complication. FINDINGS: A total of approximately 2.4 L of amber fluid was removed. Samples were sent to the laboratory as requested by the clinical team. IMPRESSION: Successful ultrasound-guided paracentesis yielding 2.4 liters of peritoneal fluid. Procedure performed by: Sherrilee Bal, PA-C under the supervision of Dr. VEAR Lent Electronically Signed   By: Wilkie Lent M.D.   On: 02/07/2024 16:56   US  ASCITES (ABDOMEN LIMITED) Result Date: 02/06/2024 CLINICAL DATA:  Abdominal distension EXAM: LIMITED ABDOMEN ULTRASOUND FOR ASCITES TECHNIQUE: Limited ultrasound survey for ascites was performed in all four abdominal quadrants. COMPARISON:  07/12/2013 FINDINGS: 4 quadrant ultrasound was performed. There is diffuse ascites, most pronounced within the lower abdomen. IMPRESSION: 1. Diffuse abdominal ascites. Electronically Signed   By: Ozell Daring M.D.   On: 02/06/2024 17:40   DG Chest 2 View Result Date: 02/06/2024 EXAM: 2 VIEW(S) XRAY OF THE CHEST 02/06/2024 01:45:00 PM  COMPARISON: 09/11/2023 CLINICAL HISTORY: Sent for fluid overload. Bilateral LE swelling since about 1 week. Legs are wrapped--wrapped this AM. Pt wears 3L at baseline since 11/28/23. FINDINGS: LUNGS AND PLEURA: Bilateral prominent interstitial markings. Possible trace bilateral pleural effusions. HEART AND MEDIASTINUM: Cardiomegaly, stable. BONES AND SOFT TISSUES: No acute osseous abnormality. IMPRESSION: 1. Cardiomegaly, stable. 2. Bilateral prominent interstitial markings and possible trace bilateral pleural effusions, consistent with fluid overload. Electronically signed by: Waddell Calk MD 02/06/2024 02:26 PM EDT RP Workstation: [id number]   CT CHEST W CONTRAST Result Date: 01/25/2024 CLINICAL DATA:  OSA, COPD EXAM: CT CHEST WITH CONTRAST TECHNIQUE: Multidetector CT imaging of the chest was performed during intravenous contrast administration.  RADIATION DOSE REDUCTION: This exam was performed according to the departmental dose-optimization program which includes automated exposure control, adjustment of the mA and/or kV according to patient size and/or use of iterative reconstruction technique. CONTRAST:  75mL OMNIPAQUE  IOHEXOL  300 MG/ML  SOLN COMPARISON:  09/05/2023 FINDINGS: Cardiovascular: Aortic atherosclerosis. Cardiomegaly. Three-vessel coronary artery calcifications. Small pericardial effusion. Mediastinum/Nodes: Unchanged prominent mediastinal and hilar lymph nodes. Thyroid gland, trachea, and esophagus demonstrate no significant findings. Lungs/Pleura: Small bilateral pleural effusions. Diffuse interlobular septal thickening. Underlying pulmonary fibrosis in a pattern with apical to basal gradient, featuring irregular peripheral interstitial opacity, septal thickening, traction bronchiectasis, and areas of subpleural bronchiolectasis of the lung bases without clear evidence of honeycombing. Upper Abdomen: Small volume ascites. Cholelithiasis. Unchanged benign left adrenal adenoma requiring no specific  further follow-up or characterization. Musculoskeletal: No chest wall abnormality. No acute osseous findings. IMPRESSION: 1. Pulmonary fibrosis in a pattern with apical to basal gradient, featuring irregular peripheral interstitial opacity, septal thickening, traction bronchiectasis, and areas of subpleural bronchiolectasis of the lung bases without clear evidence of honeycombing. 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 2. Small bilateral pleural effusions. Diffuse interlobular septal thickening, consistent with pulmonary edema. 3. Unchanged prominent mediastinal and hilar lymph nodes, likely reactive. 4. Cardiomegaly and coronary artery disease. 5. Small volume ascites. 6. Cholelithiasis. Aortic Atherosclerosis (ICD10-I70.0). Electronically Signed   By: Marolyn JONETTA Jaksch M.D.   On: 01/25/2024 14:28    ECHO as above. (Dr. Ammon reviewed echo and reports mild pericardial effusion with no evidence of cardiac tamponade, rather than moderate)  08/14/2023: EF 55% 1. No left atrial appendage thrombus.  2. Left ventricular systolic function appears normal.  3. Normal appearing pulmonary veins with appropriate systolic flow.  4. There is a small pericardial effusion present.   TELEMETRY reviewed by me 02/09/2024: Sinus rhythm, rate 50s  EKG reviewed by me: Sinus rhythm, rate 47 bpm with nonspecific ST-T wave changes  Data reviewed by me 02/09/2024: last 24h vitals tele labs imaging I/O hospitalist progress notes.  Principal Problem:   CHF (congestive heart failure) (HCC) Active Problems:   Obesity, Class III, BMI 40-49.9 (morbid obesity)   A-fib (HCC)    ASSESSMENT AND PLAN:  Rodney Oneal is a 68 y.o. male  with a past medical history of paroxysmal atrial fibrillation s/p ablation ( 08/15/2023, Eliquis ), history CVA, chronic HFpEF, hypertension, hyperlipidemia, morbid obesity, type 2 diabetes, OSA (on CPAP), CKD stage IIIb, tobacco history who presented to the ED on 02/06/2024 for  worsening fluid overload sent by outpatient cardiology as patient requires IV diuresis.  Patient with worsening lower extremity swelling and SOB for several weeks.  Patient reports 30 pound increased in past few weeks. Cardiology was consulted for further evaluation.   # Acute on chronic HFpEF # CKD stage IIIb  BNP elevated 530 in setting of morbid obesity.  CXR with cardiomegaly, bilateral pleural effusions and pulmonary vascular congestion. Abdominal ultrasound reveals diffuse ascites s/p paracentesis, removed 2.4L (09/10). Dr. Ammon reviewed echo and reports mild pericardial effusion with no evidence of cardiac tamponade, rather than moderate -Monitor and replenish electrolytes for a goal K >4, Mag >2  -Continue IV Lasix  60 mg this AM and transition to PO torsemide  20 mg daily starting this evening.  -Continue home empagliflozin  10 mg daily. -Ordered Entresto  24-26 mg BID. (Copay $0) -Ordered spirolactone 25 mg daily.   # Mild pericardial effusion # Moderate Aortic stenosis -Repeat echo at outpatient cardiology in 3 months for monitoring of pericardial effusion.   #  Hypertension # Hyperlipidemia Patient without chest pain. Troponins negative x 2.  EKG without acute ischemic changes.  - Continue atorvastatin  80 mg daily. - Transitioned losartan  to Entresto  as stated above.  - Coreg  as stated below. (Held)  # Paroxysmal atrial fibrillation s/p ablation 07/2023 # Hx CVA EKG and per tele in sinus rhythm. -Continue home Eliquis  5 mg BID for stroke risk reduction.  -Hold home coreg  with patient's baseline bradycardia. DO not resume at discharge.  [facility] follow-up on 09/16 at 10:30 AM  This patient's plan of care was discussed and created with Dr. Ammon and he is in agreement.  Signed: Dorene Comfort, PA-C  02/09/2024, 8:41 AM [facility]

## 2024-02-12 ENCOUNTER — Other Ambulatory Visit: Payer: Self-pay

## 2024-02-12 LAB — CYTOLOGY - NON PAP

## 2024-03-05 ENCOUNTER — Encounter: Attending: Physician Assistant | Admitting: Physician Assistant

## 2024-03-05 DIAGNOSIS — Z7901 Long term (current) use of anticoagulants: Secondary | ICD-10-CM | POA: Insufficient documentation

## 2024-03-05 DIAGNOSIS — E11621 Type 2 diabetes mellitus with foot ulcer: Secondary | ICD-10-CM | POA: Diagnosis present

## 2024-03-05 DIAGNOSIS — J449 Chronic obstructive pulmonary disease, unspecified: Secondary | ICD-10-CM | POA: Diagnosis not present

## 2024-03-05 DIAGNOSIS — I480 Paroxysmal atrial fibrillation: Secondary | ICD-10-CM | POA: Diagnosis not present

## 2024-03-05 DIAGNOSIS — I5042 Chronic combined systolic (congestive) and diastolic (congestive) heart failure: Secondary | ICD-10-CM | POA: Diagnosis not present

## 2024-03-05 DIAGNOSIS — T45515A Adverse effect of anticoagulants, initial encounter: Secondary | ICD-10-CM | POA: Insufficient documentation

## 2024-03-05 DIAGNOSIS — L97512 Non-pressure chronic ulcer of other part of right foot with fat layer exposed: Secondary | ICD-10-CM | POA: Insufficient documentation

## 2024-03-19 ENCOUNTER — Encounter: Admitting: Physician Assistant

## 2024-03-19 DIAGNOSIS — E11621 Type 2 diabetes mellitus with foot ulcer: Secondary | ICD-10-CM | POA: Diagnosis not present
